# Patient Record
Sex: Female | Born: 1935 | Race: Black or African American | Hispanic: No | Smoking: Never smoker
Health system: Southern US, Community
[De-identification: ages and names within clinical notes are randomized; demographics above are authoritative.]

## PROBLEM LIST (undated history)

## (undated) DIAGNOSIS — I10 Essential (primary) hypertension: Secondary | ICD-10-CM

## (undated) DIAGNOSIS — I00 Rheumatic fever without heart involvement: Secondary | ICD-10-CM

## (undated) DIAGNOSIS — I350 Nonrheumatic aortic (valve) stenosis: Secondary | ICD-10-CM

## (undated) DIAGNOSIS — E119 Type 2 diabetes mellitus without complications: Secondary | ICD-10-CM

## (undated) DIAGNOSIS — E049 Nontoxic goiter, unspecified: Secondary | ICD-10-CM

## (undated) HISTORY — DX: Nontoxic goiter, unspecified: E04.9

## (undated) HISTORY — PX: TONSILLECTOMY: SHX5217

## (undated) HISTORY — DX: Nonrheumatic aortic (valve) stenosis: I35.0

## (undated) HISTORY — DX: Rheumatic fever without heart involvement: I00

---

## 2003-08-28 ENCOUNTER — Emergency Department (HOSPITAL_COMMUNITY): Admission: EM | Admit: 2003-08-28 | Discharge: 2003-08-29 | Payer: Self-pay | Admitting: Emergency Medicine

## 2003-12-26 ENCOUNTER — Encounter: Admission: RE | Admit: 2003-12-26 | Discharge: 2003-12-26 | Payer: Self-pay | Admitting: Family Medicine

## 2004-02-15 ENCOUNTER — Emergency Department (HOSPITAL_COMMUNITY): Admission: EM | Admit: 2004-02-15 | Discharge: 2004-02-16 | Payer: Self-pay | Admitting: Emergency Medicine

## 2005-07-06 ENCOUNTER — Encounter: Admission: RE | Admit: 2005-07-06 | Discharge: 2005-07-06 | Payer: Self-pay | Admitting: Family Medicine

## 2005-12-22 ENCOUNTER — Emergency Department (HOSPITAL_COMMUNITY): Admission: EM | Admit: 2005-12-22 | Discharge: 2005-12-22 | Payer: Self-pay | Admitting: *Deleted

## 2005-12-24 ENCOUNTER — Emergency Department (HOSPITAL_COMMUNITY): Admission: EM | Admit: 2005-12-24 | Discharge: 2005-12-24 | Payer: Self-pay | Admitting: Emergency Medicine

## 2006-01-13 ENCOUNTER — Other Ambulatory Visit: Admission: RE | Admit: 2006-01-13 | Discharge: 2006-01-13 | Payer: Self-pay | Admitting: Gynecology

## 2006-04-04 ENCOUNTER — Encounter (INDEPENDENT_AMBULATORY_CARE_PROVIDER_SITE_OTHER): Payer: Self-pay | Admitting: *Deleted

## 2006-04-04 ENCOUNTER — Ambulatory Visit (HOSPITAL_BASED_OUTPATIENT_CLINIC_OR_DEPARTMENT_OTHER): Admission: RE | Admit: 2006-04-04 | Discharge: 2006-04-04 | Payer: Self-pay | Admitting: Gynecology

## 2006-10-03 ENCOUNTER — Encounter: Admission: RE | Admit: 2006-10-03 | Discharge: 2006-10-03 | Payer: Self-pay | Admitting: Family Medicine

## 2007-03-20 ENCOUNTER — Emergency Department (HOSPITAL_COMMUNITY): Admission: EM | Admit: 2007-03-20 | Discharge: 2007-03-20 | Payer: Self-pay | Admitting: Emergency Medicine

## 2007-12-10 ENCOUNTER — Encounter: Admission: RE | Admit: 2007-12-10 | Discharge: 2007-12-10 | Payer: Self-pay | Admitting: Family Medicine

## 2008-02-08 ENCOUNTER — Emergency Department (HOSPITAL_COMMUNITY): Admission: EM | Admit: 2008-02-08 | Discharge: 2008-02-08 | Payer: Self-pay | Admitting: Emergency Medicine

## 2010-10-10 ENCOUNTER — Encounter: Payer: Self-pay | Admitting: Family Medicine

## 2011-02-04 NOTE — Op Note (Signed)
Jasmine Fernandez, Jasmine Fernandez              ACCOUNT NO.:  [id number]   MEDICAL RECORD NO.:  [id number]          PATIENT TYPE:  AMB   LOCATION:  [facility]                         FACILITY:  [facility]   PHYSICIAN:  Ivor Costa. Farrel Gobble, M.D. DATE OF BIRTH:  September 23, 1928   DATE OF PROCEDURE:  04/04/2006  DATE OF DISCHARGE:                                 OPERATIVE REPORT   PREOPERATIVE DIAGNOSIS:  Endometrial mass, possible polyp.   POSTOPERATIVE DIAGNOSIS:  Endometrial mass, possible polyp.   PROCEDURE:  D&C, hysteroscopy.   SECOND ASSISTANT:  Ivor Costa. Farrel Gobble, M.D.   ANESTHESIA:  General plus paracervical block of 15 cc of 0.25% Marcaine.   FINDINGS:  A large polypoid mass that was protruding through the cervix at  the beginning of the case.  The stalk was thick and difficult to delineate.  The endometrial lining appeared irregular and hypervascular.  The ostia were  not able to be visualized.   SPECIMENS:  Endometrial curettings and polyp.   ESTIMATED BLOOD LOSS:  Minimal.   IN AND OUT DEFICIT:  3% sorbitol solution was approximately 100 cc.   DESCRIPTION OF PROCEDURE:  The patient was taken to the operating room.  General anesthesia was induced.  She was placed in the dorsal lithotomy  position and prepped and draped in the usual sterile fashion after laminaria  placed the night before was removed.   The cervix was grossly dilated, and a 31 [place] dilator was advanced without  resistance through the cervix.  Therefore, the operative scope was advanced  through the cervix.  Immediately upon placing the operative scope, the  polypoid mass was seen protruding into the cervix.  We then followed the  mass through the cervix and into the uterine canal.  The stalk was difficult  to discern from the wall.  The scope was removed, and we elected to try the  polyp forceps.  Despite multiple attempts with the polyp forceps, minimal to  no tissue was obtained.  D&C with a sharp curettage similarly revealed  minimal to no tissue.  The scope was then advanced back through the cervix.  What was felt to be the stalk was cut, and pieces of the specimen were  taken; however, based on the size of the specimen that was supposed to be  present, it seemed suboptimal.  The scope was replaced.  Multiple placements  of the hysteroscope showed resection of a large mass.  We went back in with  the polyp forceps and ultimately removed a somewhat debulked polyp.  The  scope was advanced back through again.  There appeared to be possibly a  small residual portion versus artifact.  We were unable to adequately get  this because it was small and mobile.  The remainder of the tissue, however,  did appear abnormal for the patient's age and menopausal status.  Some  biopsies were taken of that as well and sent separately.  The feeling is  that this may be a hyperplastic polyp with atypia and possibly some  endometrial cancer despite the fact that her biopsy was benign.  Therefore,  we elected  to abort the procedure at this point for the possibility that the  patient may be going back to the OR for residual disease.  The instruments  were then removed.  We took a careful look at the lower segment as well as  the cervix.  The cervix was then injected with 15 cc of 0.25% Marcaine for  postoperative pain management.   She tolerated the procedure well.  Sponge, lap, and needle counts were  correct.  She was transferred to the recovery room in due fashion.      Ivor Costa. Farrel Gobble, M.D.  Electronically Signed     THL/MEDQ  D:  04/04/2006  T:  04/04/2006  Job:  [id number]

## 2011-02-04 NOTE — H&P (Signed)
Jasmine Fernandez, Jasmine Fernandez              ACCOUNT NO.:  [id number]   MEDICAL RECORD NO.:  [id number]          PATIENT TYPE:  AMB   LOCATION:  [facility]                         FACILITY:  [facility]   PHYSICIAN:  Ivor Costa. Farrel Gobble, M.D. DATE OF BIRTH:  1929/09/01   DATE OF ADMISSION:  DATE OF DISCHARGE:                                HISTORY & PHYSICAL   CHIEF COMPLAINT:  Endometrial mass.   HISTORY OF PRESENT ILLNESS:  The patient is a 77 year old postmenopausal  woman who presents to the emergency room at [facility] who had a CAT scan  as part of her evaluation and incidentally markedly thickened endometrial  stripe.  The patient therefore underwent an ultrasound which showed her  stripe to be 2.4 cm.  The patient does have a history of multi-fibroid  uterus and her uterus was irregular consistent with that history.  The  patient denied any history of postmenopausal bleeding and went into  spontaneous menopause approximately 20 years ago.  She was referred to [place]  because of the CAT scan findings.  The patient therefore underwent a  sonohystogram which with gentle extension of the wall what appeared to be an  endometrial polyp that measured 6.6 x 1.9 x 4 cm was seen.  Based on that  the patient will present now for a D&C hysteroscopy.   PAST OBSTETRICAL/GYNECOLOGICAL HISTORY:  Past OB-GYN history as mentioned  above with history of fibroids, she has had four spontaneous vaginal  deliveries.   PAST MEDICAL HISTORY:  Past medical history is significant for chronic  hypertension for which she takes Maxzide and Tenormin.  She also has  regurgitation for which she takes Pepcid.   PAST SURGICAL HISTORY:  The patient had tonsillectomy 55 years ago.   ALLERGIES:  NONE.   SOCIAL HISTORY:  Negative.   FAMILY HISTORY:  Negative for gynecologic cancers.   PHYSICAL EXAMINATION:  She is a well-appearing elderly female in no acute  distress.  Her heart was regular rate.  Her lungs were clear.  Her  abdomen  was obese, soft and nontender.  GYN exam shows postmenopausal changes to the  external genitalia.  The BUS is negative.  The vagina was pale and smooth.  The cervix is without gross lesions.  The uterus is axial, mobile and  nontender.  Rectovaginal exam was deferred.   ASSESSMENT AND PLAN:  The patient had an echo done in October 2006 showed  trace MR.  She did have an endometrial biopsy which came back with benign  endometrial polyp and fragments of proliferative endometrium prior to  today's procedure.  The patient will therefore present now in the morning  for a D&C hysteroscopy for an  endometrial mass, most likely a benign endometrial polyp, laminaria was  placed the day before, she was given a prescription for Darvocet-N 100 for  postoperative pain management.  All risks and benefits and questions of the  surgery were reviewed and accepted.      Ivor Costa. Farrel Gobble, M.D.  Electronically Signed     THL/MEDQ  D:  04/03/2006  T:  04/03/2006  Job:  [id number]

## 2011-05-20 ENCOUNTER — Other Ambulatory Visit: Payer: Self-pay | Admitting: Physician Assistant

## 2011-05-20 ENCOUNTER — Ambulatory Visit
Admission: RE | Admit: 2011-05-20 | Discharge: 2011-05-20 | Disposition: A | Discharge status: Home / Self Care | Payer: Medicare Other | Source: Ambulatory Visit | Attending: Physician Assistant | Admitting: Physician Assistant

## 2011-05-20 DIAGNOSIS — W19XXXA Unspecified fall, initial encounter: Secondary | ICD-10-CM

## 2011-06-15 LAB — DIFFERENTIAL
Basophils Absolute: 0
Basophils Relative: 0
Eosinophils Absolute: 0.1
Eosinophils Relative: 1
Lymphocytes Relative: 11 — ABNORMAL LOW
Lymphs Abs: 1.5
Monocytes Absolute: 0.3
Monocytes Relative: 2 — ABNORMAL LOW
Neutro Abs: 12.7 — ABNORMAL HIGH
Neutrophils Relative %: 87 — ABNORMAL HIGH

## 2011-06-15 LAB — CBC
HCT: 43.9
Hemoglobin: 15
MCHC: 34.1
MCV: 85.1
Platelets: 271
RBC: 5.16 — ABNORMAL HIGH
RDW: 13.1
WBC: 14.7 — ABNORMAL HIGH

## 2011-06-15 LAB — COMPREHENSIVE METABOLIC PANEL
ALT: 13
AST: 22
Albumin: 3.4 — ABNORMAL LOW
Alkaline Phosphatase: 70
BUN: 12
CO2: 24
Calcium: 10.3
Chloride: 103
Creatinine, Ser: 0.91
GFR calc Af Amer: >60
GFR calc non Af Amer: >60
Glucose, Bld: 135 — ABNORMAL HIGH
Potassium: 3.6
Sodium: 141
Total Bilirubin: 1.4 — ABNORMAL HIGH
Total Protein: 6.8

## 2011-07-05 LAB — DIFFERENTIAL
Basophils Absolute: 0.1
Basophils Relative: 0
Eosinophils Absolute: 0.2
Eosinophils Relative: 1
Lymphocytes Relative: 11 — ABNORMAL LOW
Lymphs Abs: 1.8
Monocytes Absolute: 0.6
Monocytes Relative: 3
Neutro Abs: 14.2 — ABNORMAL HIGH
Neutrophils Relative %: 85 — ABNORMAL HIGH

## 2011-07-05 LAB — CBC
HCT: 42.3
Hemoglobin: 14.4
MCHC: 34.1
MCV: 84.2
Platelets: 336
RBC: 5.02
RDW: 13.2
WBC: 16.9 — ABNORMAL HIGH

## 2011-07-05 LAB — BASIC METABOLIC PANEL
BUN: 14
CO2: 31
Calcium: 10
Chloride: 100
Creatinine, Ser: 0.82
GFR calc Af Amer: >60
GFR calc non Af Amer: >60
Glucose, Bld: 144 — ABNORMAL HIGH
Potassium: 4.4
Sodium: 137

## 2011-07-05 LAB — URINALYSIS, ROUTINE W REFLEX MICROSCOPIC
Bilirubin Urine: NEGATIVE
Glucose, UA: NEGATIVE
Hgb urine dipstick: NEGATIVE
Specific Gravity, Urine: 1.018
pH: 7.5

## 2011-07-05 LAB — URINE MICROSCOPIC-ADD ON

## 2011-07-05 LAB — LIPASE, BLOOD: Lipase: 26

## 2012-08-02 ENCOUNTER — Emergency Department (HOSPITAL_COMMUNITY): Payer: Medicare Other

## 2012-08-02 ENCOUNTER — Encounter (HOSPITAL_COMMUNITY): Payer: Self-pay | Admitting: Emergency Medicine

## 2012-08-02 ENCOUNTER — Emergency Department (HOSPITAL_COMMUNITY)
Admission: EM | Admit: 2012-08-02 | Discharge: 2012-08-02 | Disposition: A | Discharge status: Home / Self Care | Payer: Medicare Other | Attending: Emergency Medicine | Admitting: Emergency Medicine

## 2012-08-02 DIAGNOSIS — E119 Type 2 diabetes mellitus without complications: Secondary | ICD-10-CM | POA: Insufficient documentation

## 2012-08-02 DIAGNOSIS — R109 Unspecified abdominal pain: Secondary | ICD-10-CM | POA: Insufficient documentation

## 2012-08-02 DIAGNOSIS — R112 Nausea with vomiting, unspecified: Secondary | ICD-10-CM | POA: Insufficient documentation

## 2012-08-02 DIAGNOSIS — R197 Diarrhea, unspecified: Secondary | ICD-10-CM | POA: Insufficient documentation

## 2012-08-02 DIAGNOSIS — I10 Essential (primary) hypertension: Secondary | ICD-10-CM | POA: Insufficient documentation

## 2012-08-02 DIAGNOSIS — N390 Urinary tract infection, site not specified: Secondary | ICD-10-CM

## 2012-08-02 DIAGNOSIS — R9431 Abnormal electrocardiogram [ECG] [EKG]: Secondary | ICD-10-CM | POA: Insufficient documentation

## 2012-08-02 HISTORY — DX: Essential (primary) hypertension: I10

## 2012-08-02 HISTORY — DX: Type 2 diabetes mellitus without complications: E11.9

## 2012-08-02 LAB — COMPREHENSIVE METABOLIC PANEL
AST: 28 U/L (ref 0–37)
Albumin: 3.5 g/dL (ref 3.5–5.2)
Alkaline Phosphatase: 80 U/L (ref 39–117)
BUN: 24 mg/dL — ABNORMAL HIGH (ref 6–23)
CO2: 26 mEq/L (ref 19–32)
Chloride: 88 mEq/L — ABNORMAL LOW (ref 96–112)
Creatinine, Ser: 0.93 mg/dL (ref 0.50–1.10)
GFR calc non Af Amer: 56 mL/min — ABNORMAL LOW (ref >90)
Potassium: 3.9 mEq/L (ref 3.5–5.1)
Total Bilirubin: 0.6 mg/dL (ref 0.3–1.2)

## 2012-08-02 LAB — CBC WITH DIFFERENTIAL/PLATELET
Basophils Absolute: 0 10*3/uL (ref 0.0–0.1)
Basophils Relative: 0 % (ref 0–1)
HCT: 44.3 % (ref 36.0–46.0)
Hemoglobin: 16.2 g/dL — ABNORMAL HIGH (ref 12.0–15.0)
Lymphocytes Relative: 7 % — ABNORMAL LOW (ref 12–46)
MCHC: 36.6 g/dL — ABNORMAL HIGH (ref 30.0–36.0)
Monocytes Absolute: 0.9 10*3/uL (ref 0.1–1.0)
Monocytes Relative: 5 % (ref 3–12)
Neutro Abs: 17.7 10*3/uL — ABNORMAL HIGH (ref 1.7–7.7)
Neutrophils Relative %: 88 % — ABNORMAL HIGH (ref 43–77)
WBC: 20.1 10*3/uL — ABNORMAL HIGH (ref 4.0–10.5)

## 2012-08-02 LAB — URINALYSIS, ROUTINE W REFLEX MICROSCOPIC
Bilirubin Urine: NEGATIVE
Glucose, UA: NEGATIVE mg/dL
Ketones, ur: 40 mg/dL — AB
Protein, ur: 30 mg/dL — AB
pH: 5.5 (ref 5.0–8.0)

## 2012-08-02 LAB — URINE MICROSCOPIC-ADD ON

## 2012-08-02 LAB — LACTIC ACID, PLASMA: Lactic Acid, Venous: 1.2 mmol/L (ref 0.5–2.2)

## 2012-08-02 MED ORDER — SODIUM CHLORIDE 0.9 % IV SOLN
Freq: Once | INTRAVENOUS | Status: AC
  Administered 2012-08-02: 01:00:00 via INTRAVENOUS

## 2012-08-02 MED ORDER — TRAMADOL HCL 50 MG PO TABS: 50.0000 mg | ORAL_TABLET | Freq: Four times a day (QID) | ORAL | Status: DC | PRN

## 2012-08-02 MED ORDER — ONDANSETRON HCL 4 MG PO TABS: 4.0000 mg | ORAL_TABLET | Freq: Four times a day (QID) | ORAL | Status: DC

## 2012-08-02 MED ORDER — ONDANSETRON HCL 4 MG/2ML IJ SOLN: 4.0000 mg | Freq: Once | INTRAMUSCULAR | Status: DC

## 2012-08-02 MED ORDER — CIPROFLOXACIN HCL 500 MG PO TABS: 500.0000 mg | ORAL_TABLET | Freq: Two times a day (BID) | ORAL | Status: DC

## 2012-08-02 MED ORDER — ONDANSETRON HCL 4 MG/2ML IJ SOLN
4.0000 mg | Freq: Once | INTRAMUSCULAR | Status: AC
  Administered 2012-08-02: 4 mg via INTRAVENOUS
  Filled 2012-08-02: qty 2

## 2012-08-02 MED ORDER — MORPHINE SULFATE 4 MG/ML IJ SOLN
4.0000 mg | Freq: Once | INTRAMUSCULAR | Status: AC
  Administered 2012-08-02: 4 mg via INTRAVENOUS
  Filled 2012-08-02: qty 1

## 2012-08-02 MED ORDER — IOHEXOL 300 MG/ML  SOLN
100.0000 mL | Freq: Once | INTRAMUSCULAR | Status: AC | PRN
  Administered 2012-08-02: 100 mL via INTRAVENOUS

## 2012-08-02 MED ORDER — CIPROFLOXACIN HCL 500 MG PO TABS
500.0000 mg | ORAL_TABLET | Freq: Once | ORAL | Status: AC
  Administered 2012-08-02: 500 mg via ORAL
  Filled 2012-08-02: qty 1

## 2012-08-02 NOTE — ED Notes (Signed)
ZOX:[id number]<AV> Expected date:08/02/12<BR> Expected time:12:49 AM<BR> Means of arrival:Ambulance<BR> Comments:<BR> N/V/D for 3 days

## 2012-08-02 NOTE — ED Notes (Signed)
Pt is unable to urinate.  Will try in 30 minutes.

## 2012-08-02 NOTE — ED Provider Notes (Signed)
History     CSN: [id number]  Arrival date & time 08/02/12  0100   First MD Initiated Contact with Patient 08/02/12 0111      Chief Complaint  Patient presents with  . Emesis  . Diarrhea    (Consider location/radiation/quality/duration/timing/severity/associated sxs/prior treatment) HPI Hx per PT. N/V x 3 days with ABD pain all over, sharp in quality, had some loose stool 3 days ago and no BM since. No F/C. No blood in emesis. No prior ABD surgeries, no known sick contacts, no rash or recent travel, symptoms continuous and worsening. No back pain. No h/o SBO. PCP Lupe Carney  Past Medical History  Diagnosis Date  . Diabetes mellitus without complication   . Hypertension     History reviewed. No pertinent past surgical history.  No family history on file.  History  Substance Use Topics  . Smoking status: Not on file  . Smokeless tobacco: Not on file  . Alcohol Use:     OB History    Grav Para Term Preterm Abortions TAB SAB Ect Mult Living                  Review of Systems  Constitutional: Negative for fever and chills.  HENT: Negative for neck pain and neck stiffness.   Eyes: Negative for pain.  Respiratory: Negative for shortness of breath.   Cardiovascular: Negative for chest pain.  Gastrointestinal: Positive for nausea, vomiting and abdominal pain.  Genitourinary: Negative for dysuria.  Musculoskeletal: Negative for back pain.  Skin: Negative for rash.  Neurological: Negative for headaches.  All other systems reviewed and are negative.    Allergies  Penicillins  Home Medications  No current outpatient prescriptions on file.  BP 199/57  Pulse 34  Temp 97.9 F (36.6 C) (Oral)  Resp 24  SpO2 97%  Physical Exam  Nursing note and vitals reviewed. Constitutional: She is oriented to person, place, and time. She appears well-developed and well-nourished.  HENT:  Head: Normocephalic and atraumatic.  Eyes: EOM are normal. Pupils are equal, round,  and reactive to light.  Neck: Neck supple.  Cardiovascular: Normal rate and intact distal pulses.   Murmur heard. Pulmonary/Chest: Effort normal and breath sounds normal. No respiratory distress.  Abdominal: Soft. She exhibits no distension. There is no rebound and no guarding.       Mild diffuse tenderness, dec bowel sounds  Musculoskeletal: Normal range of motion. She exhibits no edema.  Neurological: She is alert and oriented to person, place, and time.  Skin: Skin is warm and dry.    ED Course  Procedures (including critical care time)  Results for orders placed during the hospital encounter of 08/02/12  CBC WITH DIFFERENTIAL      Component Value Range   WBC 20.1 (*) 4.0 - 10.5 K/uL   RBC 5.46 (*) 3.87 - 5.11 MIL/uL   Hemoglobin 16.2 (*) 12.0 - 15.0 g/dL   HCT 40.9  81.1 - 91.4 %   MCV 81.1  78.0 - 100.0 fL   MCH 29.7  26.0 - 34.0 pg   MCHC 36.6 (*) 30.0 - 36.0 g/dL   RDW 78.2  95.6 - 21.3 %   Platelets 402 (*) 150 - 400 K/uL   Neutrophils Relative 88 (*) 43 - 77 %   Neutro Abs 17.7 (*) 1.7 - 7.7 K/uL   Lymphocytes Relative 7 (*) 12 - 46 %   Lymphs Abs 1.5  0.7 - 4.0 K/uL   Monocytes Relative 5  3 - 12 %   Monocytes Absolute 0.9  0.1 - 1.0 K/uL   Eosinophils Relative 0  0 - 5 %   Eosinophils Absolute 0.0  0.0 - 0.7 K/uL   Basophils Relative 0  0 - 1 %   Basophils Absolute 0.0  0.0 - 0.1 K/uL  COMPREHENSIVE METABOLIC PANEL      Component Value Range   Sodium 129 (*) 135 - 145 mEq/L   Potassium 3.9  3.5 - 5.1 mEq/L   Chloride 88 (*) 96 - 112 mEq/L   CO2 26  19 - 32 mEq/L   Glucose, Bld 147 (*) 70 - 99 mg/dL   BUN 24 (*) 6 - 23 mg/dL   Creatinine, Ser 4.09  0.50 - 1.10 mg/dL   Calcium 81.1 (*) 8.4 - 10.5 mg/dL   Total Protein 7.5  6.0 - 8.3 g/dL   Albumin 3.5  3.5 - 5.2 g/dL   AST 28  0 - 37 U/L   ALT 11  0 - 35 U/L   Alkaline Phosphatase 80  39 - 117 U/L   Total Bilirubin 0.6  0.3 - 1.2 mg/dL   GFR calc non Af Amer 56 (*) >90 mL/min   GFR calc Af Amer 65 (*)  >90 mL/min  LIPASE, BLOOD      Component Value Range   Lipase 47  11 - 59 U/L  URINALYSIS, ROUTINE W REFLEX MICROSCOPIC      Component Value Range   Color, Urine YELLOW  YELLOW   APPearance HAZY (*) CLEAR   Specific Gravity, Urine 1.043 (*) 1.005 - 1.030   pH 5.5  5.0 - 8.0   Glucose, UA NEGATIVE  NEGATIVE mg/dL   Hgb urine dipstick NEGATIVE  NEGATIVE   Bilirubin Urine NEGATIVE  NEGATIVE   Ketones, ur 40 (*) NEGATIVE mg/dL   Protein, ur 30 (*) NEGATIVE mg/dL   Urobilinogen, UA 0.2  0.0 - 1.0 mg/dL   Nitrite POSITIVE (*) NEGATIVE   Leukocytes, UA SMALL (*) NEGATIVE  GLUCOSE, CAPILLARY      Component Value Range   Glucose-Capillary 139 (*) 70 - 99 mg/dL  TROPONIN I      Component Value Range   Troponin I <0.30  <0.30 ng/mL  LACTIC ACID, PLASMA      Component Value Range   Lactic Acid, Venous 1.2  0.5 - 2.2 mmol/L  URINE MICROSCOPIC-ADD ON      Component Value Range   Squamous Epithelial / LPF RARE  RARE   WBC, UA 21-50  <3 WBC/hpf   RBC / HPF 0-2  <3 RBC/hpf   Bacteria, UA MANY (*) RARE   Casts HYALINE CASTS (*) NEGATIVE   US Abdomen Complete  08/02/2012  *RADIOLOGY REPORT*  Clinical Data:  Vomiting, abdominal pain.  COMPLETE ABDOMINAL ULTRASOUND  Comparison:  CT earlier today.  Findings:  Gallbladder:  No gallstones, gallbladder wall thickening, or pericholecystic fluid.  Common bile duct:   Slightly dilated, measuring 8 mm to 11 mm. Distal common bile duct not visualized.  Liver:  Mildly heterogeneous echotexture with "starry-sky" appearance.  This can be seen with hepatitis.  Recommend clinical correlation.  No focal abnormality.  IVC:  Not visualized due to overlying bowel gas.  Pancreas:  Not visualized due to overlying bowel gas.  Spleen:  Within normal limits in size and echotexture.  Right Kidney:   Slight fullness of the right renal collecting system.  No focal abnormality.  Left Kidney:  Normal in size and parenchymal  echogenicity.  No evidence of mass or hydronephrosis.   Abdominal aorta:  Not visualized due to overlying bowel gas.  IMPRESSION: "Starry-sky " appearance of the liver.  This can be seen with hepatitis.  Recommend clinical correlation.  Borderline enlargement of the common bile duct.   Original Report Authenticated By: Charlett Nose, M.D.    Ct Abdomen Pelvis W Contrast  08/02/2012  *RADIOLOGY REPORT*  Clinical Data: Diffuse abdominal pain, vomiting.  CT ABDOMEN AND PELVIS WITH CONTRAST  Technique:  Multidetector CT imaging of the abdomen and pelvis was performed following the standard protocol during bolus administration of intravenous contrast.  Contrast: OMNIPAQUE IOHEXOL 300 MG/ML  SOLN  Comparison: 12/24/2005  Findings: Heart is borderline in size.  Aortic and mitral valve calcifications present.  Lung bases are clear.  No effusions.  Liver, gallbladder, spleen, pancreas, left adrenal and kidneys are unremarkable.  Small bilateral renal cysts, stable.  Small nodule in the right adrenal gland measuring 10 mm, stable.  Multiple low-density areas within the uterus compatible with fibroids, some of which are calcified. Again, thickened endometrium is noted with possible central mass.  This is unchanged in size and appearance since 2007.  Urinary bladder and adnexa are unremarkable.  Appendix is visualized and is normal.  Stomach and large bowel grossly unremarkable.  Small bowel is normal course and caliber. No free fluid, free air or adenopathy.  Aorta is heavily calcified, non-aneurysmal.  Advanced degenerative disc and facet disease in the lumbar spine.  Grade 1 anterolisthesis of L4 on L5 and L5 on S1.  IMPRESSION: Enlarged fibroid uterus.  Again, as noted on prior study, there is a endometrial thickening for patient's age, measuring up to 2.3 cm in thickness.  Findings have not significantly changed since prior study.  No acute findings.   Original Report Authenticated By: Charlett Nose, M.D.       Date: 08/02/2012  Rate: 75  Rhythm: sinus with  bigeminy  QRS Axis: left  Intervals: normal  ST/T Wave abnormalities: nonspecific ST changes  Conduction Disutrbances:none  Narrative Interpretation:   Old EKG Reviewed: none available  ECG reviewed - no palpitations or weakness/ syncope - PT tells me she sees a cardiologist for h/o Rheumatic fever and gets ECGs regularily - no comparison ECG available  IVFs. IV morphine. IV zofran  4:42 AM recheck - Pain immproved. Still nauseated with emesis. CT reviewed no acute surgical process identified.   IV ABx for UTI and stable for d/c home. MDM   N/V with mild ABd pain and work up as above:  For UTI plan Abx and f/u PCP recheck ABd pain.   IVFs. IV Narcotics - pain resolved IV zofran - no emesis in ED Labs - leukocytosis UA -  U cx pending CT - tolerated PO contrast and PO fluids [place] - abnl liver GI referral, normal LFts ECG - unk baseline plan f/u CAR  VS and nursing notes reviewed. Improved conditoin and stable for d/c home.          Sunnie Nielsen, MD 08/02/12 [phone]

## 2012-08-02 NOTE — ED Notes (Signed)
Pt refused fluids at this time, states "still full from those 2 cups I drank",  pt referring to the contrast medium that she drank earlier.

## 2012-08-02 NOTE — ED Notes (Signed)
Report given via EMS. Pt c/o N/V/D x 3 days and loss of appetite. Initial VS 180 palpated, 62 pulse, RR 18, CBG 169 at 0039. Hx of DM and HTN. Allergic to penicillin. AAAOx4. Pt ambulatory.

## 2012-08-02 NOTE — ED Notes (Signed)
Pt cannot urinate. 

## 2012-08-04 LAB — URINE CULTURE

## 2012-08-05 NOTE — ED Notes (Signed)
+  Urine. Patient treated with Cipro. Sensitive to same. Per protocol MD. °

## 2014-01-12 ENCOUNTER — Encounter: Payer: Self-pay | Admitting: *Deleted

## 2014-01-12 DIAGNOSIS — E119 Type 2 diabetes mellitus without complications: Secondary | ICD-10-CM | POA: Insufficient documentation

## 2014-01-12 DIAGNOSIS — I10 Essential (primary) hypertension: Secondary | ICD-10-CM | POA: Insufficient documentation

## 2014-01-12 DIAGNOSIS — I350 Nonrheumatic aortic (valve) stenosis: Secondary | ICD-10-CM | POA: Insufficient documentation

## 2014-07-18 ENCOUNTER — Other Ambulatory Visit (HOSPITAL_COMMUNITY): Payer: Self-pay | Admitting: Family Medicine

## 2014-07-18 DIAGNOSIS — I350 Nonrheumatic aortic (valve) stenosis: Secondary | ICD-10-CM

## 2014-07-21 ENCOUNTER — Ambulatory Visit (HOSPITAL_COMMUNITY): Payer: Medicare Other

## 2014-07-25 ENCOUNTER — Inpatient Hospital Stay (HOSPITAL_COMMUNITY): Admission: RE | Admit: 2014-07-25 | Payer: Medicare PPO | Source: Ambulatory Visit

## 2014-07-28 ENCOUNTER — Inpatient Hospital Stay (HOSPITAL_COMMUNITY): Admission: RE | Admit: 2014-07-28 | Payer: Medicare PPO | Source: Ambulatory Visit

## 2014-07-31 ENCOUNTER — Ambulatory Visit (HOSPITAL_COMMUNITY)
Admission: RE | Admit: 2014-07-31 | Discharge: 2014-07-31 | Disposition: A | Discharge status: Home / Self Care | Payer: Medicare PPO | Source: Ambulatory Visit | Attending: Cardiology | Admitting: Cardiology

## 2014-07-31 DIAGNOSIS — I359 Nonrheumatic aortic valve disorder, unspecified: Secondary | ICD-10-CM

## 2014-07-31 DIAGNOSIS — I350 Nonrheumatic aortic (valve) stenosis: Secondary | ICD-10-CM

## 2014-07-31 DIAGNOSIS — R011 Cardiac murmur, unspecified: Secondary | ICD-10-CM

## 2014-07-31 NOTE — Progress Notes (Signed)
2D Echo Performed 07/31/2014    Naethan Bracewell, RCS  

## 2015-06-08 ENCOUNTER — Encounter: Payer: Self-pay | Admitting: Cardiovascular Disease

## 2015-06-22 ENCOUNTER — Emergency Department (HOSPITAL_COMMUNITY)
Admission: EM | Admit: 2015-06-22 | Discharge: 2015-06-22 | Disposition: A | Discharge status: Home / Self Care | Payer: Medicare PPO | Attending: Emergency Medicine | Admitting: Emergency Medicine

## 2015-06-22 ENCOUNTER — Encounter (HOSPITAL_COMMUNITY): Payer: Self-pay | Admitting: Emergency Medicine

## 2015-06-22 DIAGNOSIS — Y92410 Unspecified street and highway as the place of occurrence of the external cause: Secondary | ICD-10-CM | POA: Insufficient documentation

## 2015-06-22 DIAGNOSIS — Y998 Other external cause status: Secondary | ICD-10-CM | POA: Insufficient documentation

## 2015-06-22 DIAGNOSIS — Z880 Allergy status to penicillin: Secondary | ICD-10-CM | POA: Diagnosis not present

## 2015-06-22 DIAGNOSIS — R4182 Altered mental status, unspecified: Secondary | ICD-10-CM | POA: Insufficient documentation

## 2015-06-22 DIAGNOSIS — E119 Type 2 diabetes mellitus without complications: Secondary | ICD-10-CM | POA: Diagnosis not present

## 2015-06-22 DIAGNOSIS — E871 Hypo-osmolality and hyponatremia: Secondary | ICD-10-CM | POA: Diagnosis not present

## 2015-06-22 DIAGNOSIS — Y9389 Activity, other specified: Secondary | ICD-10-CM | POA: Insufficient documentation

## 2015-06-22 DIAGNOSIS — I10 Essential (primary) hypertension: Secondary | ICD-10-CM | POA: Diagnosis not present

## 2015-06-22 DIAGNOSIS — Z041 Encounter for examination and observation following transport accident: Secondary | ICD-10-CM | POA: Diagnosis not present

## 2015-06-22 DIAGNOSIS — Z79899 Other long term (current) drug therapy: Secondary | ICD-10-CM | POA: Diagnosis not present

## 2015-06-22 LAB — CBC WITH DIFFERENTIAL/PLATELET
Basophils Absolute: 0 10*3/uL (ref 0.0–0.1)
Basophils Relative: 0 %
EOS ABS: 0.2 10*3/uL (ref 0.0–0.7)
Eosinophils Relative: 2 %
HCT: 40.8 % (ref 36.0–46.0)
HEMOGLOBIN: 14.1 g/dL (ref 12.0–15.0)
LYMPHS ABS: 1.5 10*3/uL (ref 0.7–4.0)
LYMPHS PCT: 13 %
MCH: 29.4 pg (ref 26.0–34.0)
MCHC: 34.6 g/dL (ref 30.0–36.0)
MCV: 85 fL (ref 78.0–100.0)
Monocytes Absolute: 0.9 10*3/uL (ref 0.1–1.0)
Monocytes Relative: 8 %
NEUTROS ABS: 9.1 10*3/uL — AB (ref 1.7–7.7)
NEUTROS PCT: 77 %
Platelets: 325 10*3/uL (ref 150–400)
RBC: 4.8 MIL/uL (ref 3.87–5.11)
RDW: 12.2 % (ref 11.5–15.5)
WBC: 11.7 10*3/uL — AB (ref 4.0–10.5)

## 2015-06-22 LAB — COMPREHENSIVE METABOLIC PANEL
ALT: 12 U/L — ABNORMAL LOW (ref 14–54)
ANION GAP: 7 (ref 5–15)
AST: 22 U/L (ref 15–41)
Albumin: 3.6 g/dL (ref 3.5–5.0)
Alkaline Phosphatase: 74 U/L (ref 38–126)
BUN: 12 mg/dL (ref 6–20)
CHLORIDE: 96 mmol/L — AB (ref 101–111)
CO2: 27 mmol/L (ref 22–32)
Calcium: 9.8 mg/dL (ref 8.9–10.3)
Creatinine, Ser: 0.79 mg/dL (ref 0.44–1.00)
Glucose, Bld: 108 mg/dL — ABNORMAL HIGH (ref 65–99)
POTASSIUM: 4 mmol/L (ref 3.5–5.1)
Sodium: 130 mmol/L — ABNORMAL LOW (ref 135–145)
Total Bilirubin: 0.7 mg/dL (ref 0.3–1.2)
Total Protein: 7 g/dL (ref 6.5–8.1)

## 2015-06-22 LAB — URINALYSIS, ROUTINE W REFLEX MICROSCOPIC
BILIRUBIN URINE: NEGATIVE
Glucose, UA: NEGATIVE mg/dL
Hgb urine dipstick: NEGATIVE
KETONES UR: NEGATIVE mg/dL
NITRITE: NEGATIVE
Protein, ur: 30 mg/dL — AB
SPECIFIC GRAVITY, URINE: 1.012 (ref 1.005–1.030)
UROBILINOGEN UA: 1 mg/dL (ref 0.0–1.0)
pH: 7 (ref 5.0–8.0)

## 2015-06-22 LAB — URINE MICROSCOPIC-ADD ON

## 2015-06-22 LAB — I-STAT TROPONIN, ED: TROPONIN I, POC: 0.05 ng/mL (ref 0.00–0.08)

## 2015-06-22 NOTE — ED Notes (Signed)
Case management has spoke with family, okay for d/c
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
## 2015-06-22 NOTE — ED Notes (Signed)
Family requesting to talk to social work before discharge

## 2015-06-22 NOTE — ED Notes (Signed)
Will move pt into a private room to complete EKG
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
## 2015-06-23 ENCOUNTER — Emergency Department (HOSPITAL_COMMUNITY)
Admission: EM | Admit: 2015-06-23 | Discharge: 2015-06-23 | Disposition: A | Discharge status: Home / Self Care | Payer: Medicare PPO | Attending: Emergency Medicine | Admitting: Emergency Medicine

## 2015-06-23 ENCOUNTER — Emergency Department (HOSPITAL_COMMUNITY): Payer: Medicare PPO

## 2015-06-23 ENCOUNTER — Encounter (HOSPITAL_COMMUNITY): Payer: Self-pay | Admitting: *Deleted

## 2015-06-23 DIAGNOSIS — R011 Cardiac murmur, unspecified: Secondary | ICD-10-CM | POA: Diagnosis not present

## 2015-06-23 DIAGNOSIS — E119 Type 2 diabetes mellitus without complications: Secondary | ICD-10-CM | POA: Diagnosis not present

## 2015-06-23 DIAGNOSIS — R410 Disorientation, unspecified: Secondary | ICD-10-CM | POA: Insufficient documentation

## 2015-06-23 DIAGNOSIS — M25561 Pain in right knee: Secondary | ICD-10-CM

## 2015-06-23 DIAGNOSIS — Y92009 Unspecified place in unspecified non-institutional (private) residence as the place of occurrence of the external cause: Secondary | ICD-10-CM | POA: Diagnosis not present

## 2015-06-23 DIAGNOSIS — W010XXA Fall on same level from slipping, tripping and stumbling without subsequent striking against object, initial encounter: Secondary | ICD-10-CM | POA: Diagnosis not present

## 2015-06-23 DIAGNOSIS — M25562 Pain in left knee: Secondary | ICD-10-CM

## 2015-06-23 DIAGNOSIS — I10 Essential (primary) hypertension: Secondary | ICD-10-CM | POA: Insufficient documentation

## 2015-06-23 DIAGNOSIS — Y998 Other external cause status: Secondary | ICD-10-CM | POA: Insufficient documentation

## 2015-06-23 DIAGNOSIS — S8992XA Unspecified injury of left lower leg, initial encounter: Secondary | ICD-10-CM | POA: Diagnosis not present

## 2015-06-23 DIAGNOSIS — R531 Weakness: Secondary | ICD-10-CM | POA: Diagnosis present

## 2015-06-23 DIAGNOSIS — Z880 Allergy status to penicillin: Secondary | ICD-10-CM | POA: Insufficient documentation

## 2015-06-23 DIAGNOSIS — S8991XA Unspecified injury of right lower leg, initial encounter: Secondary | ICD-10-CM | POA: Diagnosis not present

## 2015-06-23 DIAGNOSIS — Z7982 Long term (current) use of aspirin: Secondary | ICD-10-CM | POA: Insufficient documentation

## 2015-06-23 DIAGNOSIS — Z79899 Other long term (current) drug therapy: Secondary | ICD-10-CM | POA: Diagnosis not present

## 2015-06-23 DIAGNOSIS — Y9389 Activity, other specified: Secondary | ICD-10-CM | POA: Insufficient documentation

## 2015-06-23 LAB — BASIC METABOLIC PANEL
Anion gap: 7 (ref 5–15)
BUN: 14 mg/dL (ref 6–20)
CHLORIDE: 97 mmol/L — AB (ref 101–111)
CO2: 26 mmol/L (ref 22–32)
CREATININE: 0.86 mg/dL (ref 0.44–1.00)
Calcium: 9.7 mg/dL (ref 8.9–10.3)
Glucose, Bld: 106 mg/dL — ABNORMAL HIGH (ref 65–99)
POTASSIUM: 4.9 mmol/L (ref 3.5–5.1)
SODIUM: 130 mmol/L — AB (ref 135–145)

## 2015-06-23 LAB — URINALYSIS, ROUTINE W REFLEX MICROSCOPIC
Bilirubin Urine: NEGATIVE
GLUCOSE, UA: NEGATIVE mg/dL
HGB URINE DIPSTICK: NEGATIVE
KETONES UR: NEGATIVE mg/dL
LEUKOCYTES UA: NEGATIVE
Nitrite: NEGATIVE
PROTEIN: 30 mg/dL — AB
Specific Gravity, Urine: 1.017 (ref 1.005–1.030)
UROBILINOGEN UA: 1 mg/dL (ref 0.0–1.0)
pH: 6.5 (ref 5.0–8.0)

## 2015-06-23 LAB — URINE MICROSCOPIC-ADD ON

## 2015-06-23 LAB — CBC WITH DIFFERENTIAL/PLATELET
BASOS PCT: 0 %
Basophils Absolute: 0 10*3/uL (ref 0.0–0.1)
EOS ABS: 0.4 10*3/uL (ref 0.0–0.7)
EOS PCT: 2 %
HCT: 43.1 % (ref 36.0–46.0)
HEMOGLOBIN: 14.9 g/dL (ref 12.0–15.0)
Lymphocytes Relative: 11 %
Lymphs Abs: 1.6 10*3/uL (ref 0.7–4.0)
MCH: 29.7 pg (ref 26.0–34.0)
MCHC: 34.6 g/dL (ref 30.0–36.0)
MCV: 85.9 fL (ref 78.0–100.0)
Monocytes Absolute: 0.9 10*3/uL (ref 0.1–1.0)
Monocytes Relative: 6 %
NEUTROS PCT: 81 %
Neutro Abs: 11.8 10*3/uL — ABNORMAL HIGH (ref 1.7–7.7)
PLATELETS: 281 10*3/uL (ref 150–400)
RBC: 5.02 MIL/uL (ref 3.87–5.11)
RDW: 12.3 % (ref 11.5–15.5)
WBC: 14.7 10*3/uL — AB (ref 4.0–10.5)
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
## 2015-06-23 NOTE — Discharge Instructions (Signed)
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
## 2015-06-23 NOTE — ED Provider Notes (Signed)
Medical screening examination/treatment/procedure(s) were conducted as a shared visit with non-physician practitioner(s) and myself.  I personally evaluated the patient during the encounter.   EKG Interpretation   Date/Time:  Tuesday June 23 2015 15:49:21 EDT Ventricular Rate:  50 PR Interval:  314 QRS Duration: 84 QT Interval:  481 QTC Calculation: 439 R Axis:   -27 Text Interpretation:  Sinus rhythm Prolonged PR interval LVH with  secondary repolarization abnormality Probable anterior infarct, age  indeterminate No significant change since last tracing Confirmed by Gesenia Bantz   MD, Riot Waterworth ([id number]) on 06/23/2015 5:44:34 PM     Patient here complaining of weakness. Labs show mild hyponatremia which is unchanged from yesterday. Patient has no focal neurological findings. Feels well to go home  Lorre Nick, MD 06/23/15 1945

## 2015-06-24 LAB — URINE CULTURE

## 2015-06-29 ENCOUNTER — Encounter (HOSPITAL_COMMUNITY): Payer: Self-pay | Admitting: *Deleted

## 2015-06-29 ENCOUNTER — Inpatient Hospital Stay (HOSPITAL_COMMUNITY)
Admission: EM | Admit: 2015-06-29 | Discharge: 2015-07-03 | DRG: 682 | Disposition: A | Discharge status: Skilled Nursing Facility | Payer: Medicare PPO | Attending: Internal Medicine | Admitting: Internal Medicine

## 2015-06-29 ENCOUNTER — Emergency Department (HOSPITAL_COMMUNITY): Payer: Medicare PPO

## 2015-06-29 DIAGNOSIS — Z6824 Body mass index (BMI) 24.0-24.9, adult: Secondary | ICD-10-CM | POA: Diagnosis not present

## 2015-06-29 DIAGNOSIS — E870 Hyperosmolality and hypernatremia: Secondary | ICD-10-CM | POA: Diagnosis present

## 2015-06-29 DIAGNOSIS — E440 Moderate protein-calorie malnutrition: Secondary | ICD-10-CM

## 2015-06-29 DIAGNOSIS — L89329 Pressure ulcer of left buttock, unspecified stage: Secondary | ICD-10-CM | POA: Diagnosis not present

## 2015-06-29 DIAGNOSIS — M6282 Rhabdomyolysis: Secondary | ICD-10-CM | POA: Diagnosis not present

## 2015-06-29 DIAGNOSIS — F0390 Unspecified dementia without behavioral disturbance: Secondary | ICD-10-CM | POA: Diagnosis present

## 2015-06-29 DIAGNOSIS — E860 Dehydration: Secondary | ICD-10-CM | POA: Diagnosis not present

## 2015-06-29 DIAGNOSIS — Z8249 Family history of ischemic heart disease and other diseases of the circulatory system: Secondary | ICD-10-CM

## 2015-06-29 DIAGNOSIS — Y92009 Unspecified place in unspecified non-institutional (private) residence as the place of occurrence of the external cause: Secondary | ICD-10-CM

## 2015-06-29 DIAGNOSIS — I350 Nonrheumatic aortic (valve) stenosis: Secondary | ICD-10-CM | POA: Diagnosis present

## 2015-06-29 DIAGNOSIS — I00 Rheumatic fever without heart involvement: Secondary | ICD-10-CM | POA: Diagnosis present

## 2015-06-29 DIAGNOSIS — E119 Type 2 diabetes mellitus without complications: Secondary | ICD-10-CM | POA: Diagnosis present

## 2015-06-29 DIAGNOSIS — N179 Acute kidney failure, unspecified: Secondary | ICD-10-CM | POA: Diagnosis present

## 2015-06-29 DIAGNOSIS — L89159 Pressure ulcer of sacral region, unspecified stage: Secondary | ICD-10-CM | POA: Diagnosis present

## 2015-06-29 DIAGNOSIS — I10 Essential (primary) hypertension: Secondary | ICD-10-CM | POA: Diagnosis present

## 2015-06-29 DIAGNOSIS — I248 Other forms of acute ischemic heart disease: Secondary | ICD-10-CM | POA: Diagnosis present

## 2015-06-29 DIAGNOSIS — D72829 Elevated white blood cell count, unspecified: Secondary | ICD-10-CM | POA: Diagnosis present

## 2015-06-29 DIAGNOSIS — Z8673 Personal history of transient ischemic attack (TIA), and cerebral infarction without residual deficits: Secondary | ICD-10-CM

## 2015-06-29 DIAGNOSIS — G9340 Encephalopathy, unspecified: Secondary | ICD-10-CM | POA: Diagnosis present

## 2015-06-29 DIAGNOSIS — W19XXXA Unspecified fall, initial encounter: Secondary | ICD-10-CM | POA: Diagnosis not present

## 2015-06-29 DIAGNOSIS — L8990 Pressure ulcer of unspecified site, unspecified stage: Secondary | ICD-10-CM

## 2015-06-29 DIAGNOSIS — W19XXXD Unspecified fall, subsequent encounter: Secondary | ICD-10-CM | POA: Diagnosis not present

## 2015-06-29 DIAGNOSIS — R531 Weakness: Secondary | ICD-10-CM

## 2015-06-29 LAB — URINALYSIS, ROUTINE W REFLEX MICROSCOPIC
Bilirubin Urine: NEGATIVE
Glucose, UA: NEGATIVE mg/dL
Ketones, ur: 15 mg/dL — AB
LEUKOCYTES UA: NEGATIVE
NITRITE: NEGATIVE
PROTEIN: 30 mg/dL — AB
SPECIFIC GRAVITY, URINE: 1.021 (ref 1.005–1.030)
UROBILINOGEN UA: 0.2 mg/dL (ref 0.0–1.0)
pH: 5.5 (ref 5.0–8.0)

## 2015-06-29 LAB — CBC
HEMATOCRIT: 52.3 % — AB (ref 36.0–46.0)
HEMOGLOBIN: 17.7 g/dL — AB (ref 12.0–15.0)
MCH: 30.1 pg (ref 26.0–34.0)
MCHC: 33.8 g/dL (ref 30.0–36.0)
MCV: 88.9 fL (ref 78.0–100.0)
Platelets: 276 10*3/uL (ref 150–400)
RBC: 5.88 MIL/uL — AB (ref 3.87–5.11)
RDW: 12.8 % (ref 11.5–15.5)
WBC: 26.7 10*3/uL — AB (ref 4.0–10.5)

## 2015-06-29 LAB — DIFFERENTIAL
Basophils Absolute: 0 10*3/uL (ref 0.0–0.1)
Basophils Relative: 0 %
Eosinophils Absolute: 0 10*3/uL (ref 0.0–0.7)
Eosinophils Relative: 0 %
LYMPHS ABS: 0.8 10*3/uL (ref 0.7–4.0)
LYMPHS PCT: 3 %
MONOS PCT: 8 %
Monocytes Absolute: 2.1 10*3/uL — ABNORMAL HIGH (ref 0.1–1.0)
NEUTROS PCT: 89 %
Neutro Abs: 23.9 10*3/uL — ABNORMAL HIGH (ref 1.7–7.7)

## 2015-06-29 LAB — LACTIC ACID, PLASMA
LACTIC ACID, VENOUS: 2.2 mmol/L — AB (ref 0.5–2.0)
LACTIC ACID, VENOUS: 1.7 mmol/L (ref 0.5–2.0)

## 2015-06-29 LAB — BASIC METABOLIC PANEL
ANION GAP: 15 (ref 5–15)
BUN: 85 mg/dL — ABNORMAL HIGH (ref 6–20)
CO2: 31 mmol/L (ref 22–32)
Calcium: 10.4 mg/dL — ABNORMAL HIGH (ref 8.9–10.3)
Chloride: 100 mmol/L — ABNORMAL LOW (ref 101–111)
Creatinine, Ser: 1.41 mg/dL — ABNORMAL HIGH (ref 0.44–1.00)
GFR, EST AFRICAN AMERICAN: 38 mL/min — AB (ref >60)
GFR, EST NON AFRICAN AMERICAN: 33 mL/min — AB (ref >60)
GLUCOSE: 156 mg/dL — AB (ref 65–99)
POTASSIUM: 4.2 mmol/L (ref 3.5–5.1)
Sodium: 146 mmol/L — ABNORMAL HIGH (ref 135–145)

## 2015-06-29 LAB — I-STAT CG4 LACTIC ACID, ED: LACTIC ACID, VENOUS: 2.67 mmol/L — AB (ref 0.5–2.0)

## 2015-06-29 LAB — TROPONIN I: TROPONIN I: 0.11 ng/mL — AB (ref <0.031)

## 2015-06-29 LAB — HEPATIC FUNCTION PANEL
ALBUMIN: 3.5 g/dL (ref 3.5–5.0)
ALT: 32 U/L (ref 14–54)
AST: 61 U/L — AB (ref 15–41)
Alkaline Phosphatase: 91 U/L (ref 38–126)
Bilirubin, Direct: 0.2 mg/dL (ref 0.1–0.5)
Indirect Bilirubin: 0.9 mg/dL (ref 0.3–0.9)
TOTAL PROTEIN: 8 g/dL (ref 6.5–8.1)
Total Bilirubin: 1.1 mg/dL (ref 0.3–1.2)

## 2015-06-29 LAB — URINE MICROSCOPIC-ADD ON

## 2015-06-29 LAB — CK: Total CK: 919 U/L — ABNORMAL HIGH (ref 38–234)

## 2015-06-29 MED ORDER — SODIUM CHLORIDE 0.9 % IV BOLUS (SEPSIS)
1000.0000 mL | Freq: Once | INTRAVENOUS | Status: AC
  Administered 2015-06-29: 1000 mL via INTRAVENOUS

## 2015-06-29 MED ORDER — SODIUM CHLORIDE 0.9 % IV SOLN
INTRAVENOUS | Status: AC
  Administered 2015-06-29: 23:00:00 via INTRAVENOUS

## 2015-06-29 MED ORDER — ENOXAPARIN SODIUM 30 MG/0.3ML ~~LOC~~ SOLN
30.0000 mg | Freq: Every day | SUBCUTANEOUS | Status: DC
  Administered 2015-06-29 – 2015-06-30 (×2): 30 mg via SUBCUTANEOUS
  Filled 2015-06-29 (×4): qty 0.3

## 2015-06-29 MED ORDER — INFLUENZA VAC SPLIT QUAD 0.5 ML IM SUSY
0.5000 mL | PREFILLED_SYRINGE | INTRAMUSCULAR | Status: AC
  Administered 2015-06-30: 0.5 mL via INTRAMUSCULAR
  Filled 2015-06-29 (×2): qty 0.5

## 2015-06-29 MED ORDER — ACETAMINOPHEN 325 MG PO TABS
325.0000 mg | ORAL_TABLET | Freq: Every day | ORAL | Status: DC | PRN
  Administered 2015-07-02 – 2015-07-03 (×2): 325 mg via ORAL
  Filled 2015-06-29 (×2): qty 1

## 2015-06-29 MED ORDER — ASPIRIN EC 81 MG PO TBEC
81.0000 mg | DELAYED_RELEASE_TABLET | Freq: Every day | ORAL | Status: DC | PRN
  Filled 2015-06-29: qty 1

## 2015-06-29 MED ORDER — ENSURE ENLIVE PO LIQD
237.0000 mL | Freq: Two times a day (BID) | ORAL | Status: DC
  Administered 2015-06-30 – 2015-07-03 (×7): 237 mL via ORAL
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
## 2015-06-29 NOTE — ED Notes (Signed)
Unable to get 2nd set of blood cultures QNS, notified nurse

## 2015-06-29 NOTE — H&P (Signed)
History and Physical  Jasmine Fernandez VWU:[id number] DOB: November 29, 1928 DOA: 06/29/2015  PCP: Lupe Carney, MD   Chief Complaint: Fall / LOC  History of Present Illness:  Patient is a 81 year old female with history of HTN, rheumatic fever who was brought by EMS as she was found on floor in her apartment after family lost contact with her. Patient said the last thing she remember was being in the hospital ( she was in the ER on 10/4 and was discharged home). She was found with possible urine/stool incontinence. She has no complaints but had bruise on her back with decub ulcer on her buttock.    Review of Systems:  CONSTITUTIONAL:  No night sweats.  No fatigue, malaise, lethargy.  No fever or chills. Eyes:  No visual changes.  No eye pain.  No eye discharge.   ENT:    No epistaxis.  No sinus pain.  No sore throat.  No ear pain.  No congestion. RESPIRATORY:  No cough.  No wheeze.  No hemoptysis.  No shortness of breath. CARDIOVASCULAR:  No chest pains.  No palpitations. GASTROINTESTINAL:  No abdominal pain.  No nausea or vomiting.  No diarrhea or constipation.  No hematemesis.  No hematochezia.  No melena. GENITOURINARY:  No urgency.  No frequency.  No dysuria.  No hematuria.  No obstructive symptoms.  No discharge.  No pain.  No significant abnormal bleeding. MUSCULOSKELETAL:  No musculoskeletal pain.  No joint swelling.  No arthritis. NEUROLOGICAL:  No confusion.  No weakness. No headache. No seizure. PSYCHIATRIC:  No depression. No anxiety. No suicidal ideation. SKIN:  No rashes.  No lesions.  No wounds. ENDOCRINE:  No unexplained weight loss.  No polydipsia.  No polyuria.  No polyphagia. HEMATOLOGIC:  No anemia.  No purpura.  No petechiae.  No bleeding.  ALLERGIC AND IMMUNOLOGIC:  No pruritus.  No swelling Other:  Past Medical and Surgical History:   Past Medical History  Diagnosis Date  . Diabetes mellitus without complication (HCC)   . Hypertension   . Goiter   .  Aortic stenosis   . Rheumatic fever    Past Surgical History  Procedure Laterality Date  . Tonsillectomy      During Childhood    Social History:   reports that she has never smoked. She does not have any smokeless tobacco history on file. She reports that she does not drink alcohol or use illicit drugs.  Allergies  Allergen Reactions  . Penicillins Swelling    Has patient had a PCN reaction causing immediate rash, facial/tongue/throat swelling, SOB or lightheadedness with hypotension:no Has patient had a PCN reaction causing severe rash involving mucus membranes or skin necrosis:no Has patient had a PCN reaction that required hospitalization: no Has patient had a PCN reaction occurring within the last 10 years: No If all of the above answers are "NO", then may proceed with Cephalosporin use.     Family History  Problem Relation Age of Onset  . Heart disease Father   . Heart attack Father      Prior to Admission medications   Medication Sig Start Date End Date Taking? Authorizing Provider  indapamide (LOZOL) 1.25 MG tablet Take 1.25 mg by mouth every morning.   Yes Historical Provider, MD  losartan (COZAAR) 25 MG tablet Take 25 mg by mouth daily.   Yes Historical Provider, MD  TENORMIN 50 MG tablet Take 50 mg by mouth daily. 04/06/15  Yes Historical Provider, MD  Acetaminophen (TYLENOL PO)  Take 1 tablet by mouth daily as needed (pain).    Historical Provider, MD  aspirin EC 81 MG tablet Take 81 mg by mouth daily as needed for mild pain.    Historical Provider, MD  Multiple Vitamin (MULTIVITAMIN WITH MINERALS) TABS tablet Take 1 tablet by mouth daily.    Historical Provider, MD    Physical Exam: BP 156/70 mmHg  Pulse 105  Temp(Src) 99.4 F (37.4 C) (Rectal)  Resp 16  Ht  (1.651 m)  Wt 68.04 kg (150 lb)  BMI 24.96 kg/m2  SpO2 95%  GENERAL :appears to be in no acute distress. HEAD: normocephalic. EYES: PERRL, EOMI.  EARS:  hearing grossly intact. NOSE: No nasal  discharge. THROAT: Oral cavity and pharynx normal.  NECK: Neck supple. CARDIAC: Normal S1 and S2. No S3, S4. Systolic murmur on LSB. Rhythm is regular. LUNGS: Clear to auscultation . ABDOMEN: Positive bowel sounds. Soft, nondistended, nontender. No guarding or rebound. No masses. MUSKULOSKELETAL:. No joint erythema or tenderness.  EXTREMITIES: No significant deformity or joint abnormality. NEUROLOGICAL: The mental examination revealed the patient was oriented to person, place, and time.CN II-XII intact.  SKIN: bruise on upper back, un-stagable ulcer on buttock.  PSYCHIATRIC:  The patient was able to demonstrate good judgement and reason, without hallucinations, abnormal affect or abnormal behaviors during the examination. Patient is not suicidal.          Labs on Admission:  Reviewed.   Radiological Exams on Admission: Dg Chest 2 View  06/29/2015   CLINICAL DATA:  Acute confusion and lethargy.  EXAM: CHEST  2 VIEW  COMPARISON:  04/04/2006 chest radiograph  FINDINGS: The cardiomediastinal silhouette is unremarkable.  There is no evidence of focal airspace disease, pulmonary edema, suspicious pulmonary nodule/mass, pleural effusion, or pneumothorax. No acute bony abnormalities are identified.  IMPRESSION: No active cardiopulmonary disease.   Electronically Signed   By: Harmon Pier M.D.   On: 06/29/2015 19:53    EKG:  Independently reviewed.  Assessment/Plan  Fall with LOC: Mechanical vs. Stroke vs. Seizure vs. Sepsis CT head without acute abnormality Leukocytosis suggestive of sepsis vs severe dehydration No identified source of infection and no symptoms Will hold on abx: get bcx and ucx Continue IVF Mild rhabdomyolysis  Creatinine at baseline    H/o CVA:  Continue asp   DVT prophylaxis: Gully enoxaparin  Code Status: Full  Eston Esters M.D [facility]

## 2015-06-29 NOTE — ED Notes (Signed)
Patient transported to X-ray 

## 2015-06-29 NOTE — ED Notes (Signed)
Bed: WA25 Expected date:  Expected time:  Means of arrival:  Comments: ems  
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
## 2015-06-29 NOTE — ED Notes (Signed)
Attempted to in and out cath the pt, without urine return. Bladder scanner shows 37ml, will attempt again.
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
## 2015-06-29 NOTE — ED Notes (Signed)
Unable to collect labs at this time per nurse wait about 20 minutes until fluids go in.

## 2015-06-29 NOTE — ED Notes (Signed)
MD at bedside. 

## 2015-06-29 NOTE — ED Notes (Signed)
Provider notified of abnormal lactic acid. 

## 2015-06-30 ENCOUNTER — Encounter (HOSPITAL_COMMUNITY): Payer: Self-pay | Admitting: General Surgery

## 2015-06-30 DIAGNOSIS — N179 Acute kidney failure, unspecified: Secondary | ICD-10-CM | POA: Diagnosis not present

## 2015-06-30 DIAGNOSIS — E440 Moderate protein-calorie malnutrition: Secondary | ICD-10-CM

## 2015-06-30 DIAGNOSIS — W19XXXD Unspecified fall, subsequent encounter: Secondary | ICD-10-CM

## 2015-06-30 LAB — COMPREHENSIVE METABOLIC PANEL
ALK PHOS: 70 U/L (ref 38–126)
ALT: 23 U/L (ref 14–54)
ANION GAP: 7 (ref 5–15)
AST: 41 U/L (ref 15–41)
Albumin: 2.6 g/dL — ABNORMAL LOW (ref 3.5–5.0)
BILIRUBIN TOTAL: 1.1 mg/dL (ref 0.3–1.2)
BUN: 60 mg/dL — AB (ref 6–20)
CALCIUM: 9.1 mg/dL (ref 8.9–10.3)
CO2: 29 mmol/L (ref 22–32)
Chloride: 112 mmol/L — ABNORMAL HIGH (ref 101–111)
Creatinine, Ser: 1.05 mg/dL — ABNORMAL HIGH (ref 0.44–1.00)
GFR calc Af Amer: 55 mL/min — ABNORMAL LOW (ref >60)
GFR, EST NON AFRICAN AMERICAN: 47 mL/min — AB (ref >60)
Glucose, Bld: 180 mg/dL — ABNORMAL HIGH (ref 65–99)
POTASSIUM: 3.4 mmol/L — AB (ref 3.5–5.1)
Sodium: 148 mmol/L — ABNORMAL HIGH (ref 135–145)
TOTAL PROTEIN: 5.9 g/dL — AB (ref 6.5–8.1)

## 2015-06-30 LAB — CBC WITH DIFFERENTIAL/PLATELET
BASOS ABS: 0 10*3/uL (ref 0.0–0.1)
BASOS PCT: 0 %
EOS PCT: 0 %
Eosinophils Absolute: 0 10*3/uL (ref 0.0–0.7)
HCT: 42.3 % (ref 36.0–46.0)
Hemoglobin: 13.9 g/dL (ref 12.0–15.0)
LYMPHS PCT: 3 %
Lymphs Abs: 0.7 10*3/uL (ref 0.7–4.0)
MCH: 29.4 pg (ref 26.0–34.0)
MCHC: 32.9 g/dL (ref 30.0–36.0)
MCV: 89.4 fL (ref 78.0–100.0)
Monocytes Absolute: 2 10*3/uL — ABNORMAL HIGH (ref 0.1–1.0)
Monocytes Relative: 8 %
NEUTROS ABS: 22.1 10*3/uL — AB (ref 1.7–7.7)
Neutrophils Relative %: 89 %
PLATELETS: 227 10*3/uL (ref 150–400)
RBC: 4.73 MIL/uL (ref 3.87–5.11)
RDW: 12.7 % (ref 11.5–15.5)
WBC: 24.7 10*3/uL — AB (ref 4.0–10.5)

## 2015-06-30 LAB — VITAMIN B12: VITAMIN B 12: 1449 pg/mL — AB (ref 180–914)

## 2015-06-30 LAB — TROPONIN I
TROPONIN I: 0.1 ng/mL — AB (ref <0.031)
TROPONIN I: 0.09 ng/mL — AB (ref <0.031)

## 2015-06-30 LAB — TSH: TSH: 0.873 u[IU]/mL (ref 0.350–4.500)

## 2015-06-30 LAB — MAGNESIUM: MAGNESIUM: 2.5 mg/dL — AB (ref 1.7–2.4)

## 2015-06-30 MED ORDER — ASPIRIN 300 MG RE SUPP
300.0000 mg | Freq: Every day | RECTAL | Status: DC
  Administered 2015-06-30 – 2015-07-03 (×4): 300 mg via RECTAL
  Filled 2015-06-30 (×4): qty 1

## 2015-06-30 MED ORDER — POTASSIUM CHLORIDE CRYS ER 20 MEQ PO TBCR: 40.0000 meq | EXTENDED_RELEASE_TABLET | Freq: Once | ORAL | Status: DC

## 2015-06-30 MED ORDER — ENOXAPARIN SODIUM 30 MG/0.3ML ~~LOC~~ SOLN: 30.0000 mg | SUBCUTANEOUS | Status: DC

## 2015-06-30 MED ORDER — ATENOLOL 50 MG PO TABS
50.0000 mg | ORAL_TABLET | Freq: Every day | ORAL | Status: DC
  Administered 2015-06-30 – 2015-07-03 (×4): 50 mg via ORAL
  Filled 2015-06-30 (×4): qty 1

## 2015-06-30 MED ORDER — LEVOFLOXACIN IN D5W 750 MG/150ML IV SOLN
750.0000 mg | INTRAVENOUS | Status: DC
  Administered 2015-06-30: 750 mg via INTRAVENOUS
  Filled 2015-06-30 (×2): qty 150

## 2015-06-30 MED ORDER — VANCOMYCIN HCL IN DEXTROSE 1-5 GM/200ML-% IV SOLN
1000.0000 mg | INTRAVENOUS | Status: DC
  Administered 2015-06-30 – 2015-07-01 (×2): 1000 mg via INTRAVENOUS
  Filled 2015-06-30 (×3): qty 200

## 2015-06-30 MED ORDER — SODIUM CHLORIDE 0.45 % IV SOLN
INTRAVENOUS | Status: DC
  Administered 2015-06-30 – 2015-07-01 (×3): via INTRAVENOUS

## 2015-06-30 MED ORDER — COLLAGENASE 250 UNIT/GM EX OINT
TOPICAL_OINTMENT | Freq: Every day | CUTANEOUS | Status: DC
  Administered 2015-06-30 – 2015-07-03 (×4): via TOPICAL
  Filled 2015-06-30 (×2): qty 30

## 2015-06-30 MED ORDER — POTASSIUM CHLORIDE CRYS ER 20 MEQ PO TBCR
40.0000 meq | EXTENDED_RELEASE_TABLET | Freq: Every day | ORAL | Status: DC
  Administered 2015-06-30 – 2015-07-03 (×4): 40 meq via ORAL
  Filled 2015-06-30 (×4): qty 2

## 2015-06-30 MED ORDER — ASPIRIN 325 MG PO TABS
325.0000 mg | ORAL_TABLET | Freq: Every day | ORAL | Status: DC
  Filled 2015-06-30: qty 1

## 2015-06-30 NOTE — Clinical Social Work Placement (Signed)
   CLINICAL SOCIAL WORK PLACEMENT  NOTE  Date:  06/30/2015  Patient Details  Name: Jasmine Fernandez MRN: [id number] Date of Birth: April 25, 1929  Clinical Social Work is seeking post-discharge placement for this patient at the Skilled  Nursing Facility level of care (*CSW will initial, date and re-position this form in  chart as items are completed):  Yes   Patient/family provided with [facility] Clinical Social Work Department's list of facilities offering this level of care within the geographic area requested by the patient (or if unable, by the patient's family).  Yes   Patient/family informed of their freedom to choose among providers that offer the needed level of care, that participate in Medicare, Medicaid or managed care program needed by the patient, have an available bed and are willing to accept the patient.  Yes   Patient/family informed of [facility]'s ownership interest in [facility] and [facility], as well as of the fact that they are under no obligation to receive care at these facilities.  PASRR submitted to EDS on 06/30/15     PASRR number received on       Existing PASRR number confirmed on       FL2 transmitted to all facilities in geographic area requested by pt/family on 06/30/15     FL2 transmitted to all facilities within larger geographic area on       Patient informed that his/her managed care company has contracts with or will negotiate with certain facilities, including the following:            Patient/family informed of bed offers received.  Patient chooses bed at       Physician recommends and patient chooses bed at      Patient to be transferred to   on  .  Patient to be transferred to facility by       Patient family notified on   of transfer.  Name of family member notified:        PHYSICIAN       Additional Comment:    _______________________________________________ Loleta Dicker, LCSW 06/30/2015, 4:29 PM

## 2015-06-30 NOTE — Progress Notes (Signed)
ANTIBIOTIC CONSULT NOTE - INITIAL  Pharmacy Consult for Vancomycin, Levofloxacin Indication: Wound Infection  Allergies  Allergen Reactions  . Penicillins Swelling    Has patient had a PCN reaction causing immediate rash, facial/tongue/throat swelling, SOB or lightheadedness with hypotension: YES Has patient had a PCN reaction causing severe rash involving mucus membranes or skin necrosis:no Has patient had a PCN reaction that required hospitalization: no Has patient had a PCN reaction occurring within the last 10 years: No If all of the above answers are "NO", then may proceed with Cephalosporin use.     Patient Measurements: Height:  (165.1 cm) Weight: 150 lb (68.04 kg) IBW/kg (Calculated) : 57  Vital Signs: Temp: 98.8 F (37.1 C) (10/11 0503) Temp Source: Oral (10/11 0503) BP: 130/58 mmHg (10/11 0503) Pulse Rate: 99 (10/11 0503) Intake/Output from previous day: 10/10 0701 - 10/11 0700 In: 2081.3 [I.V.:81.3; IV Piggyback:2000] Out: 125 [Urine:125] Intake/Output from this shift: Total I/O In: 480 [P.O.:480] Out: -   Labs:  Recent Labs  06/29/15 1722 06/30/15 0600  WBC 26.7* 24.7*  HGB 17.7* 13.9  PLT 276 227  CREATININE 1.41* 1.05*   Estimated Creatinine Clearance: 35.9 mL/min (by C-G formula based on Cr of 1.05). No results for input(s): VANCOTROUGH, VANCOPEAK, VANCORANDOM, GENTTROUGH, GENTPEAK, GENTRANDOM, TOBRATROUGH, TOBRAPEAK, TOBRARND, AMIKACINPEAK, AMIKACINTROU, AMIKACIN in the last 72 hours.   Microbiology: Recent Results (from the past 720 hour(s))  Urine culture     Status: None   Collection Time: 06/22/15  4:01 PM  Result Value Ref Range Status   Specimen Description URINE, RANDOM  Final   Special Requests NONE  Final   Culture   Final    MULTIPLE SPECIES PRESENT, SUGGEST RECOLLECTION Performed at [facility]    Report Status 06/24/2015 FINAL  Final  Blood culture (routine x 2)     Status: None (Preliminary result)   Collection  Time: 06/29/15  5:25 PM  Result Value Ref Range Status   Specimen Description BLOOD LEFT ANTECUBITAL  Final   Special Requests BOTTLES DRAWN AEROBIC AND ANAEROBIC 5CC   Final   Culture   Final    NO GROWTH < 24 HOURS Performed at [facility]    Report Status PENDING  Incomplete  Blood culture (routine x 2)     Status: None (Preliminary result)   Collection Time: 06/29/15  7:05 PM  Result Value Ref Range Status   Specimen Description BLOOD RIGHT ANTECUBITAL  Final   Special Requests BOTTLES DRAWN AEROBIC AND ANAEROBIC 5CC   Final   Culture   Final    NO GROWTH < 24 HOURS Performed at [facility]    Report Status PENDING  Incomplete    Medical History: Past Medical History  Diagnosis Date  . Diabetes mellitus without complication (HCC)   . Hypertension   . Goiter   . Aortic stenosis   . Rheumatic fever      Assessment: 14 y/oF with PMH of DM, HTN, AS who was found down at home and brought to [facility] ED on 06/29/15. Patient was found to have AKI, dehydration, leukocytosis, and decub wound. Pharmacy consulted to dose Vancomycin and Levofloxacin for possible wound infection.   10/11 >> Vancomycin >> 10/11 >> Levofloxacin >>    10/10 blood x 2: NGTD 10/10 urine: sent   Tmax 99.12F WBC elevated at 24.7K SCr improved to 1.05 with CrCl ~ 36 ml/min CG  Goal of Therapy:  Vancomycin trough level 15-20 mcg/ml  Appropriate antibiotic dosing  for renal function and indication Eradication of infection  Plan:   Vancomycin 1g IV q24h.  Plan for Vancomycin trough level at steady state.  Levofloxacin  IV q48h.  Monitor renal function, cultures, clinical course.    Greer Pickerel, PharmD, BCPS Pager: [phone] 06/30/2015 2:47 PM

## 2015-06-30 NOTE — Progress Notes (Signed)
Pt refused  PO aspirin. She stated that it makes her stomach upset. Notified MD. New order placed.

## 2015-06-30 NOTE — Consult Note (Signed)
Jasmine Fernandez [birth year]  [id number].    Requesting MD: Dr. Domenic Polite Chief Complaint/Reason for Consult: left gluteal wound HPI: This is an 81 yo black female with a h/o HTN, DM, AS who was found down at home after she took a fall at least 2 days ago.  She was brought to [facility] where she was admitted.  She has a mild elevation of her CK indicating at least a mild rhabdo.  She has a WBC of 24K with an unknown source.  She has been found to have a pressure wound on her left gluteus that we have been asked to see.  ROS : please see HPI, otherwise negative.  She does have some forgetfulness/dementia  Family History  Problem Relation Age of Onset  . Heart disease Father   . Heart attack Father     Past Medical History  Diagnosis Date  . Diabetes mellitus without complication ([place])   . Hypertension   . Goiter   . Aortic stenosis   . Rheumatic fever     Past Surgical History  Procedure Laterality Date  . Tonsillectomy      During Childhood    Social History:  reports that she has never smoked. She does not have any smokeless tobacco history on file. She reports that she does not drink alcohol or use illicit drugs.  Allergies:  Allergies  Allergen Reactions  . Penicillins Swelling    Has patient had a PCN reaction causing immediate rash, facial/tongue/throat swelling, SOB or lightheadedness with hypotension:no Has patient had a PCN reaction causing severe rash involving mucus membranes or skin necrosis:no Has patient had a PCN reaction that required hospitalization: no Has patient had a PCN reaction occurring within the last 10 years: No If all of the above answers are "NO", then may proceed with Cephalosporin use.     Medications Prior to Admission  Medication Sig Dispense Refill  . indapamide (LOZOL) 1.25 MG tablet Take 1.25 mg by mouth every morning.    Marland Kitchen losartan (COZAAR) 25 MG tablet Take 25 mg by mouth daily.    . TENORMIN 50 MG tablet Take 50 mg by mouth daily.   0  . Acetaminophen (TYLENOL PO) Take 1 tablet by mouth daily as needed (pain).    Marland Kitchen aspirin EC 81 MG tablet Take 81 mg by mouth daily as needed for mild pain.    . Multiple Vitamin (MULTIVITAMIN WITH MINERALS) TABS tablet Take 1 tablet by mouth daily.      Blood pressure 130/58, pulse 99, temperature 98.8 F (37.1 C), temperature source Oral, resp. rate 20, height _0  (1.651 m), weight 68.04 kg (150 lb), SpO2 92 %. Physical Exam: General: pleasant, WD, WN black female who is laying in bed in NAD Heart: regular, rate, and rhythm.  Normal s1,s2. No obvious gallops, or rubs noted, but she has a loud murmur.  Palpable radial and pedal pulses bilaterally Lungs: CTAB, no wheezes, rhonchi, or rales noted.  Respiratory effort nonlabored Abd: soft, NT, ND, +BS, no masses, hernias, or organomegaly Skin: warm and dry with no masses, lesions, or rashes.  She has a large area of eschar noted on her left buttock from pressure.  This is not soft or mushy as if there is any infection or fluctuance underneath this eschar.  No erythema or outward evidence of infection currently.  This measures about 8x8cm.  No drainage Psych: A&Ox3 with somewhat flat affect.    Results for orders placed or performed during the hospital  encounter of 06/29/15 (from the past 48 hour(s))  Basic metabolic panel     Status: Abnormal   Collection Time: 06/29/15  5:22 PM  Result Value Ref Range   Sodium 146 (H) 135 - 145 mmol/L   Potassium 4.2 3.5 - 5.1 mmol/L   Chloride 100 (L) 101 - 111 mmol/L   CO2 31 22 - 32 mmol/L   Glucose, Bld 156 (H) 65 - 99 mg/dL   BUN 85 (H) 6 - 20 mg/dL   Creatinine, Ser 1.41 (H) 0.44 - 1.00 mg/dL   Calcium 10.4 (H) 8.9 - 10.3 mg/dL   GFR calc non Af Amer 33 (L) >60 mL/min   GFR calc Af Amer 38 (L) >60 mL/min    Comment: (NOTE) The eGFR has been calculated using the CKD EPI equation. This calculation has not been validated in all clinical situations. eGFR's persistently <60 mL/min signify  possible Chronic Kidney Disease.    Anion gap 15 5 - 15  CBC     Status: Abnormal   Collection Time: 06/29/15  5:22 PM  Result Value Ref Range   WBC 26.7 (H) 4.0 - 10.5 K/uL   RBC 5.88 (H) 3.87 - 5.11 MIL/uL   Hemoglobin 17.7 (H) 12.0 - 15.0 g/dL   HCT 52.3 (H) 36.0 - 46.0 %   MCV 88.9 78.0 - 100.0 fL   MCH 30.1 26.0 - 34.0 pg   MCHC 33.8 30.0 - 36.0 g/dL   RDW 12.8 11.5 - 15.5 %   Platelets 276 150 - 400 K/uL  Differential     Status: Abnormal   Collection Time: 06/29/15  5:22 PM  Result Value Ref Range   Neutrophils Relative % 89 %   Neutro Abs 23.9 (H) 1.7 - 7.7 K/uL   Lymphocytes Relative 3 %   Lymphs Abs 0.8 0.7 - 4.0 K/uL   Monocytes Relative 8 %   Monocytes Absolute 2.1 (H) 0.1 - 1.0 K/uL   Eosinophils Relative 0 %   Eosinophils Absolute 0.0 0.0 - 0.7 K/uL   Basophils Relative 0 %   Basophils Absolute 0.0 0.0 - 0.1 K/uL  Blood culture (routine x 2)     Status: None (Preliminary result)   Collection Time: 06/29/15  5:25 PM  Result Value Ref Range   Specimen Description BLOOD LEFT ANTECUBITAL    Special Requests BOTTLES DRAWN AEROBIC AND ANAEROBIC 5CC     Culture      NO GROWTH < 24 HOURS Performed at [facility]    Report Status PENDING   Hepatic function panel     Status: Abnormal   Collection Time: 06/29/15  5:39 PM  Result Value Ref Range   Total Protein 8.0 6.5 - 8.1 g/dL   Albumin 3.5 3.5 - 5.0 g/dL   AST 61 (H) 15 - 41 U/L   ALT 32 14 - 54 U/L   Alkaline Phosphatase 91 38 - 126 U/L   Total Bilirubin 1.1 0.3 - 1.2 mg/dL   Bilirubin, Direct 0.2 0.1 - 0.5 mg/dL   Indirect Bilirubin 0.9 0.3 - 0.9 mg/dL  Lactic acid, plasma     Status: Abnormal   Collection Time: 06/29/15  5:39 PM  Result Value Ref Range   Lactic Acid, Venous 2.2 (HH) 0.5 - 2.0 mmol/L    Comment: CRITICAL RESULT CALLED TO, READ BACK BY AND VERIFIED WITH: DAVIS N RN AT 1818 ON 10.10.16 BY MENDOZA B    Troponin I     Status: Abnormal  Collection Time: 06/29/15  5:39 PM  Result  Value Ref Range   Troponin I 0.11 (H) <0.031 ng/mL    Comment:        PERSISTENTLY INCREASED TROPONIN VALUES IN THE RANGE OF 0.04-0.49 ng/mL CAN BE SEEN IN:       -UNSTABLE ANGINA       -CONGESTIVE HEART FAILURE       -MYOCARDITIS       -CHEST TRAUMA       -ARRYHTHMIAS       -LATE PRESENTING MYOCARDIAL INFARCTION       -COPD   CLINICAL FOLLOW-UP RECOMMENDED.   CK     Status: Abnormal   Collection Time: 06/29/15  5:39 PM  Result Value Ref Range   Total CK 919 (H) 38 - 234 U/L  I-Stat CG4 Lactic Acid, ED     Status: Abnormal   Collection Time: 06/29/15  5:44 PM  Result Value Ref Range   Lactic Acid, Venous 2.67 (HH) 0.5 - 2.0 mmol/L   Comment NOTIFIED PHYSICIAN   Urinalysis, Routine w reflex microscopic (not at [facility])     Status: Abnormal   Collection Time: 06/29/15  6:42 PM  Result Value Ref Range   Color, Urine YELLOW YELLOW   APPearance CLOUDY (A) CLEAR   Specific Gravity, Urine 1.021 1.005 - 1.030   pH 5.5 5.0 - 8.0   Glucose, UA NEGATIVE NEGATIVE mg/dL   Hgb urine dipstick SMALL (A) NEGATIVE   Bilirubin Urine NEGATIVE NEGATIVE   Ketones, ur 15 (A) NEGATIVE mg/dL   Protein, ur 30 (A) NEGATIVE mg/dL   Urobilinogen, UA 0.2 0.0 - 1.0 mg/dL   Nitrite NEGATIVE NEGATIVE   Leukocytes, UA NEGATIVE NEGATIVE  Urine microscopic-add on     Status: Abnormal   Collection Time: 06/29/15  6:42 PM  Result Value Ref Range   Squamous Epithelial / LPF FEW (A) RARE   WBC, UA 0-2 <3 WBC/hpf   RBC / HPF 0-2 <3 RBC/hpf   Bacteria, UA FEW (A) RARE   Casts HYALINE CASTS (A) NEGATIVE  Blood culture (routine x 2)     Status: None (Preliminary result)   Collection Time: 06/29/15  7:05 PM  Result Value Ref Range   Specimen Description BLOOD RIGHT ANTECUBITAL    Special Requests BOTTLES DRAWN AEROBIC AND ANAEROBIC 5CC     Culture      NO GROWTH < 24 HOURS Performed at [facility]    Report Status PENDING   Lactic acid, plasma     Status: None   Collection Time: 06/29/15  8:26 PM   Result Value Ref Range   Lactic Acid, Venous 1.7 0.5 - 2.0 mmol/L  Comprehensive metabolic panel     Status: Abnormal   Collection Time: 06/30/15  6:00 AM  Result Value Ref Range   Sodium 148 (H) 135 - 145 mmol/L   Potassium 3.4 (L) 3.5 - 5.1 mmol/L    Comment: DELTA CHECK NOTED REPEATED TO VERIFY    Chloride 112 (H) 101 - 111 mmol/L   CO2 29 22 - 32 mmol/L   Glucose, Bld 180 (H) 65 - 99 mg/dL   BUN 60 (H) 6 - 20 mg/dL   Creatinine, Ser 1.05 (H) 0.44 - 1.00 mg/dL   Calcium 9.1 8.9 - 10.3 mg/dL   Total Protein 5.9 (L) 6.5 - 8.1 g/dL   Albumin 2.6 (L) 3.5 - 5.0 g/dL   AST 41 15 - 41 U/L   ALT 23 14 - 54  U/L   Alkaline Phosphatase 70 38 - 126 U/L   Total Bilirubin 1.1 0.3 - 1.2 mg/dL   GFR calc non Af Amer 47 (L) >60 mL/min   GFR calc Af Amer 55 (L) >60 mL/min    Comment: (NOTE) The eGFR has been calculated using the CKD EPI equation. This calculation has not been validated in all clinical situations. eGFR's persistently <60 mL/min signify possible Chronic Kidney Disease.    Anion gap 7 5 - 15  CBC WITH DIFFERENTIAL     Status: Abnormal   Collection Time: 06/30/15  6:00 AM  Result Value Ref Range   WBC 24.7 (H) 4.0 - 10.5 K/uL   RBC 4.73 3.87 - 5.11 MIL/uL   Hemoglobin 13.9 12.0 - 15.0 g/dL    Comment: REPEATED TO VERIFY DELTA CHECK NOTED    HCT 42.3 36.0 - 46.0 %   MCV 89.4 78.0 - 100.0 fL   MCH 29.4 26.0 - 34.0 pg   MCHC 32.9 30.0 - 36.0 g/dL   RDW 12.7 11.5 - 15.5 %   Platelets 227 150 - 400 K/uL   Neutrophils Relative % 89 %   Neutro Abs 22.1 (H) 1.7 - 7.7 K/uL   Lymphocytes Relative 3 %   Lymphs Abs 0.7 0.7 - 4.0 K/uL   Monocytes Relative 8 %   Monocytes Absolute 2.0 (H) 0.1 - 1.0 K/uL   Eosinophils Relative 0 %   Eosinophils Absolute 0.0 0.0 - 0.7 K/uL   Basophils Relative 0 %   Basophils Absolute 0.0 0.0 - 0.1 K/uL  Magnesium     Status: Abnormal   Collection Time: 06/30/15  6:00 AM  Result Value Ref Range   Magnesium 2.5 (H) 1.7 - 2.4 mg/dL  TSH      Status: None   Collection Time: 06/30/15 10:56 AM  Result Value Ref Range   TSH 0.873 0.350 - 4.500 uIU/mL   Dg Chest 2 View  06/29/2015   CLINICAL DATA:  Acute confusion and lethargy.  EXAM: CHEST  2 VIEW  COMPARISON:  04/04/2006 chest radiograph  FINDINGS: The cardiomediastinal silhouette is unremarkable.  There is no evidence of focal airspace disease, pulmonary edema, suspicious pulmonary nodule/mass, pleural effusion, or pneumothorax. No acute bony abnormalities are identified.  IMPRESSION: No active cardiopulmonary disease.   Electronically Signed   By: Margarette Canada M.D.   On: 06/29/2015 19:53       Assessment/Plan 1. unstageable left gluteus pressure ulcer -agree with WOC recommendations.  Santyl to this wound and hydrotherapy are recommended to help debride this eschar.  Obviously with the eschar in place it is difficult to definitively say, but this does not currently appear to be the source of her elevated WBC.   -will follow to make sure she doesn't need any sharp debridement in the future. -thank you for this consultation.  Olvin Rohr E 06/30/2015, 1:30 PM Pager: [phone]

## 2015-06-30 NOTE — Progress Notes (Addendum)
[facility] PROGRESS NOTE  Jasmine Fernandez XLK:[id number] DOB: March 09, 1929 DOA: 06/29/2015 PCP: Lupe Carney, MD   81 yo female with a h/o HTN, DM, AS who was found down at home, unknown time down,  She was brought to [facility] where she was admitted. Unable to get a reliable history, found to have AKI, dehydration, WBC 26K and decub wounds. She lives alone and mostly independent at baseline  Assessment/Plan: 1. Fall/Found down -unable to obtain meaningful history/details about this -Ambulate, PT to see  2. AKI /Hypernatremia -due to dehydration, volume depletion -BUN significantly up -change IVf to 1/2NS, monitor Bmet  3. Dehydration -as above  4. Leukocytosis -unclear if related to decub wound, no other clear focus of infection, no fevers -has large wound with eschar on L buttock, will order Hydrotherapy, Wound RN consult appreciated, will ask CCS to eval to make sure that is appropriate -start IV Vanc, and Zosyn -FU Blood CX and urine Cx  5. Sacral decubitus ulcer/eschar -present prior to admission -see above  5. Suspected Dementia/Cognitive dysfunction.Marland Kitchensuspect a component of encephalopathy from infection, dehydration too -check TSH, B12 -quite functional at baseline per sister and grandson,   6. DM -hold metformin, SSI -check Hbaic  7. HTN -resume atenolol, hold ARB  8. Elevated troponin -no chest pain, EKG with LVH and repol -suspect demand related, trend troponin, check ECHO -add ASA and resume Atenolol  Code Status: Full Code Family Communication: d/w sister and grandson  Disposition Plan: will likely need placement    HPI/Subjective: No complaints, flat affect, unable to give meaningful history  Objective: Filed Vitals:   06/30/15 0503  BP: 130/58  Pulse: 99  Temp: 98.8 F (37.1 C)  Resp: 20    Intake/Output Summary (Last 24 hours) at 06/30/15 1129 Last data filed at 06/30/15 1033  Gross per 24 hour  Intake 2561.25 ml  Output    125 ml   Net 2436.25 ml   Filed Weights   06/29/15 1647  Weight: 68.04 kg (150 lb)    Exam:   General:  AAOx to self and place only  Cardiovascular: S1S2/RRR, systolic murmur  Respiratory: CTAB  Abdomen: soft, NT, BS present  Musculoskeletal: no edema c/c, R knee with mild swelling and painful RoM  Sacrun: L upper buttock with large   Data Reviewed: Basic Metabolic Panel:  Recent Labs Lab 06/23/15 1642 06/29/15 1722 06/30/15 0600  NA 130* 146* 148*  K 4.9 4.2 3.4*  CL 97* 100* 112*  CO2 GLUCOSE 106* 156* 180*  BUN 14 85* 60*  CREATININE 0.86 1.41* 1.05*  CALCIUM 9.7 10.4* 9.1   Liver Function Tests:  Recent Labs Lab 06/29/15 1739 06/30/15 0600  AST 61* 41  ALT 32 23  ALKPHOS 91 70  BILITOT 1.1 1.1  PROT 8.0 5.9*  ALBUMIN 3.5 2.6*   No results for input(s): LIPASE, AMYLASE in the last 168 hours. No results for input(s): AMMONIA in the last 168 hours. CBC:  Recent Labs Lab 06/23/15 1642 06/29/15 1722 06/30/15 0600  WBC 14.7* 26.7* 24.7*  NEUTROABS 11.8* 23.9* 22.1*  HGB 14.9 17.7* 13.9  HCT 43.1 52.3* 42.3  MCV 85.9 88.9 89.4  PLT 281 276 227   Cardiac Enzymes:  Recent Labs Lab 06/29/15 1739  CKTOTAL 919*  TROPONINI 0.11*   BNP (last 3 results) No results for input(s): BNP in the last 8760 hours.  ProBNP (last 3 results) No results for input(s): PROBNP in the last 8760 hours.  CBG:  No results for input(s): GLUCAP in the last 168 hours.  Recent Results (from the past 240 hour(s))  Urine culture     Status: None   Collection Time: 06/22/15  4:01 PM  Result Value Ref Range Status   Specimen Description URINE, RANDOM  Final   Special Requests NONE  Final   Culture   Final    MULTIPLE SPECIES PRESENT, SUGGEST RECOLLECTION Performed at [facility]    Report Status 06/24/2015 FINAL  Final     Studies: Dg Chest 2 View  06/29/2015   CLINICAL DATA:  Acute confusion and lethargy.  EXAM: CHEST  2 VIEW  COMPARISON:   04/04/2006 chest radiograph  FINDINGS: The cardiomediastinal silhouette is unremarkable.  There is no evidence of focal airspace disease, pulmonary edema, suspicious pulmonary nodule/mass, pleural effusion, or pneumothorax. No acute bony abnormalities are identified.  IMPRESSION: No active cardiopulmonary disease.   Electronically Signed   By: Harmon Pier M.D.   On: 06/29/2015 19:53    Scheduled Meds: . enoxaparin (LOVENOX) injection  30 mg Subcutaneous QHS  . feeding supplement (ENSURE ENLIVE)  237 mL Oral BID BM  . potassium chloride  40 mEq Oral Once   Continuous Infusions: . sodium chloride 100 mL/hr at 06/30/15 1029   Antibiotics Given (last 72 hours)    None      Active Problems:   Weakness   Pressure ulcer   Fall    Time spent:    [facility]  [facility] Pager [phone]. If 7PM-7AM, please contact night-coverage at [email or web address], password [facility] 06/30/2015, 11:29 AM  LOS: 1 day

## 2015-06-30 NOTE — Clinical Social Work Note (Signed)
Clinical Social Work Assessment  Patient Details  Name: Jasmine Fernandez MRN: [id number] Date of Birth: Oct 19, 1928  Date of referral:  06/30/15               Reason for consult:  Discharge Planning                Permission sought to share information with:  Case Manager, Family Supports Permission granted to share information::  Yes, Verbal Permission Granted  Name::      (Reggie Beulah Gandy and Tonita Phoenix)  Agency::     Relationship::   Yolanda Bonine and daughter)  Sport and exercise psychologist Information:   Nevin Bloodgood: [phone])  Housing/Transportation Living arrangements for the past 2 months:  [place] of Information:  Patient, Other (Comment Required) (Grandson Lynelle Smoke) Patient Interpreter Needed:  None Criminal Activity/Legal Involvement Pertinent to Current Situation/Hospitalization:  No - Comment as needed Significant Relationships:  Adult Children, Other Family Members Lives with:  Self Do you feel safe going back to the place where you live?  Yes Need for family participation in patient care:  Yes (Comment)  Care giving concerns:  Grandson Reggie voiced concerns about the patient being at home alone. Informed CSW that he would need to talk with other family members to help the patient come up with a decision.    Social Worker assessment / plan:  CSW received consult to speak with patient regarding skilled nursing plan for short term rehab.  CSW met with pt and pt grandson at bedside. Patient was alert and oriented x4. Pt states that she thinks she was dehydrated and fell out. Patient now open to short term rehab at skilled nursing to regain overall strength. Pt provided permission to speak with daughter. Patient interested in [place] for short term rehab.  Pt was hesitant about going to a facility, as she would prefer to go home. CSW, and pt grandson provided support and discussed patients need for further assistance at this time.   Employment status:  Disabled (Comment  on whether or not currently receiving Disability) Insurance information:  Medicare, Programmer, applications [employer]) PT Recommendations:  Not assessed at this time Information / Referral to community resources:  [place]  Patient/Family's Response to care:  Pt thanked csw for concern and support. Pt motivated for short term rehab in hopes of returning home after rehab.   Patient/Family's Understanding of and Emotional Response to Diagnosis, Current Treatment, and Prognosis:  Pt verbalized understanding of current diagnosis, treatment, and prognosis. Pt is hopeful to discharge to short term rehab once medically stable.   Emotional Assessment Appearance:  Appears stated age Attitude/Demeanor/Rapport:  Guarded (Calm and Cooperative) Affect (typically observed):  Accepting, Appropriate, Calm, Guarded, Pleasant Orientation:  Oriented to Self, Oriented to Place, Oriented to  Time, Oriented to Situation Alcohol / Substance use:  Not Applicable Psych involvement (Current and /or in the community):  No (Comment)  Discharge Needs  Concerns to be addressed:  Discharge Planning Concerns Readmission within the last 30 days:  No Current discharge risk:  Physical Impairment Barriers to Discharge:  Other (Patient acknowledges that she will need assistance, however , is hesitant about going to a SNF or receiving home health)   Raymondo Band, LCSW 06/30/2015, 4:20 PM

## 2015-06-30 NOTE — Consult Note (Addendum)
WOC wound consult note Reason for Consult: Pressure injuries, partial thickness and Unstageable Wound type:pressure Pressure Ulcer POA: Yes Measurement: left buttocks:  9cm x 10cm area with stable black, soft eschar in center measuring 8cm x 7.5cm.  Malodorous, not fluctuant.  14cm x 0.4cm linear scratch laterally to the Unstageable ulcer.  Right hip:  2cm x 3.5cm x 0.2cm partial thickness tissue loss with pink, moist wound bed with scant serous exudate.  Right lateral LE 2cm x 5cm x 0.2cm partial thickness tissue loss with pink, moist wound bed and scant serous exudate.  Right lateral heel:  4cm x 3cm ulcer with wound bed and depth obscured by the presence of dried serum (vs eschar). No exudate. unstageable. Wound bed:As described above Drainage (amount, consistency, odor) As described above Periwound:intact, dry Dressing procedure/placement/frequency: I have consulted with Dr. Jomarie Longs and we agree that a CCS consult is indicated.  In the meantime, we have ordered hydrotherapy and an enzymatic debriding agent (Santyl) to be applied daily.  PT can provide conservative sharp wound debridement (CSWD) as indicated following treatments and prior to debriding agent application. A therapeutic mattress is provided with low air loss feature as well as bilateral pressure redistribution heel boots. WOC nursing team will not follow, but will remain available to this patient, the nursing and medical teams.  Please re-consult if needed. Thanks, Ladona Mow, MSN, RN, GNP, [place], [facility] [phone])

## 2015-06-30 NOTE — Plan of Care (Signed)
RN, Belenda Cruise, paged this NP because pt was refusing the  dose of ASA because it upsets her stomach. She was on  ASA at home without issue. Increased dose deemed necessary for heart issues. Changed ASA to suppository and asked RN to explain why increased dose is necessary.  KJKG, NP [facility]

## 2015-06-30 NOTE — Care Management Note (Signed)
Case Management Note  Patient Details  Name: DESHARA ROSSI MRN: [id number] Date of Birth: Feb 21, 1929  Subjective/Objective:      urosepsis              Action/Plan:Date:  Oct. 11, 2016 U.R. performed for needs and level of care. Will continue to follow for Case Management needs.  Marcelle Smiling, RN, BSN, [place]   [phone]   Expected Discharge Date:   (unknown)               Expected Discharge Plan:  Skilled Nursing Facility  In-House Referral:  Clinical Social Work  Discharge planning Services  CM Consult  Post Acute Care Choice:  NA Choice offered to:  NA  DME Arranged:    DME Agency:     HH Arranged:    HH Agency:     Status of Service:  In process, will continue to follow  Medicare Important Message Given:    Date Medicare IM Given:    Medicare IM give by:    Date Additional Medicare IM Given:    Additional Medicare Important Message give by:     If discussed at Long Length of Stay Meetings, dates discussed:    Additional Comments:  Golda Acre, RN 06/30/2015, 2:41 PM

## 2015-06-30 NOTE — Progress Notes (Addendum)
Patient had more than 10 beat run Vtach with no signs & symptoms. Notified MD. New orders placed.  Continue to monitor

## 2015-06-30 NOTE — Progress Notes (Signed)
Initial Nutrition Assessment   DOCUMENTATION CODES:   Non-severe (moderate) malnutrition in context of social or environmental circumstances  INTERVENTION:  - Continue Ensure Enlive po BID, each supplement provides 350 kcal and 20 grams of protein - Down grade diet to appropriate dysphagia level; pt does not have dentures present and was pocketing food during RD visit - RD will continue to monitor for needs  NUTRITION DIAGNOSIS:   Biting/chewing difficulty related to other (see comment) (no teeth and dentures not present) as evidenced by per patient/family report.  GOAL:   Patient will meet greater than or equal to 90% of their needs  MONITOR:   PO intake, Supplement acceptance, Weight trends, Labs, Skin, I & O's  REASON FOR ASSESSMENT:   Malnutrition Screening Tool  ASSESSMENT:   81 year old female with history of HTN, rheumatic fever who was brought by EMS as she was found on floor in her apartment after family lost contact with her. Patient said the last thing she remember was being in the hospital ( she was in the ER on 10/4 and was discharged home). She was found with possible urine/stool incontinence. She has no complaints but had bruise on her back with decub ulcer on her buttock.   Pt seen for MST. BMI indicates overweight status. Per chart review, pt ate 25% of breakfast this AM. She had eaten a few bites of fish, rice, and cooked carrots at time of RD visit but states she is not feeling hungry. She denies abdominal pain or nausea at this time. Pt states that she has dentures but she is unsure of where they are. Pt was noted to be pocketing food during discussion with RD; RD was able to have pt spit food bolus into napkin.   Pt's sister and grandson were present in the room at time of visit. They state that pt has lost weight over the past 3-6 months due to increased activity. They are unsure of UBW or amount of weight lost and pt unable to contribute to this topic.  Moderate muscle and fat wasting noted during physical assessment.   Unable to obtain further information concerning eating habits PTA. Unsure if pt was meeting needs. Ensure Enlive order already in for BID. Medications reviewed. Labs reviewed; Na: 148 mmol/L, K: 3.4 mmol/L, Cl: 112 mmol/L, BUN/creatinine elevated but trending down, GFR: 55.   Diet Order:  Diet regular Room service appropriate?: Yes; Fluid consistency:: Thin  Skin:   MASD to L buttocks  Last BM:  10/10  Height:   Ht Readings from Last 1 Encounters:  06/29/15  (1.651 m)    Weight:   Wt Readings from Last 1 Encounters:  06/29/15 150 lb (68.04 kg)    Ideal Body Weight:  56.82 kg (kg)  BMI:  Body mass index is 24.96 kg/(m^2).  Estimated Nutritional Needs:   Kcal:  1350-1550  Protein:  55-65 grams  Fluid:  2-2.2 L/day  EDUCATION NEEDS:   No education needs identified at this time     Trenton Gammon, RD, LDN Inpatient Clinical Dietitian Pager # [phone] After hours/weekend pager # [phone]

## 2015-07-01 ENCOUNTER — Inpatient Hospital Stay (HOSPITAL_COMMUNITY): Payer: Medicare PPO

## 2015-07-01 DIAGNOSIS — L8990 Pressure ulcer of unspecified site, unspecified stage: Secondary | ICD-10-CM

## 2015-07-01 DIAGNOSIS — E119 Type 2 diabetes mellitus without complications: Secondary | ICD-10-CM

## 2015-07-01 DIAGNOSIS — E440 Moderate protein-calorie malnutrition: Secondary | ICD-10-CM

## 2015-07-01 DIAGNOSIS — R531 Weakness: Secondary | ICD-10-CM

## 2015-07-01 DIAGNOSIS — W19XXXA Unspecified fall, initial encounter: Secondary | ICD-10-CM

## 2015-07-01 DIAGNOSIS — N179 Acute kidney failure, unspecified: Secondary | ICD-10-CM

## 2015-07-01 DIAGNOSIS — D72829 Elevated white blood cell count, unspecified: Secondary | ICD-10-CM

## 2015-07-01 DIAGNOSIS — I350 Nonrheumatic aortic (valve) stenosis: Secondary | ICD-10-CM

## 2015-07-01 LAB — URINE CULTURE: Culture: NO GROWTH

## 2015-07-01 LAB — BASIC METABOLIC PANEL
ANION GAP: 6 (ref 5–15)
BUN: 36 mg/dL — AB (ref 6–20)
CHLORIDE: 106 mmol/L (ref 101–111)
CO2: 28 mmol/L (ref 22–32)
CREATININE: 0.94 mg/dL (ref 0.44–1.00)
Calcium: 8.9 mg/dL (ref 8.9–10.3)
GFR calc Af Amer: >60 mL/min (ref >60)
GFR, EST NON AFRICAN AMERICAN: 54 mL/min — AB (ref >60)
Glucose, Bld: 132 mg/dL — ABNORMAL HIGH (ref 65–99)
POTASSIUM: 3.7 mmol/L (ref 3.5–5.1)
SODIUM: 140 mmol/L (ref 135–145)

## 2015-07-01 LAB — CBC
HCT: 38.2 % (ref 36.0–46.0)
HEMOGLOBIN: 12.4 g/dL (ref 12.0–15.0)
MCH: 29.2 pg (ref 26.0–34.0)
MCHC: 32.5 g/dL (ref 30.0–36.0)
MCV: 90.1 fL (ref 78.0–100.0)
Platelets: 195 10*3/uL (ref 150–400)
RBC: 4.24 MIL/uL (ref 3.87–5.11)
RDW: 12.9 % (ref 11.5–15.5)
WBC: 19.7 10*3/uL — AB (ref 4.0–10.5)

## 2015-07-01 LAB — HEMOGLOBIN A1C
Hgb A1c MFr Bld: 6.3 % — ABNORMAL HIGH (ref 4.8–5.6)
Mean Plasma Glucose: 134 mg/dL

## 2015-07-01 MED ORDER — SACCHAROMYCES BOULARDII 250 MG PO CAPS
250.0000 mg | ORAL_CAPSULE | Freq: Two times a day (BID) | ORAL | Status: DC
  Administered 2015-07-01 – 2015-07-03 (×4): 250 mg via ORAL
  Filled 2015-07-01 (×6): qty 1

## 2015-07-01 MED ORDER — ENOXAPARIN SODIUM 40 MG/0.4ML ~~LOC~~ SOLN
40.0000 mg | SUBCUTANEOUS | Status: DC
  Administered 2015-07-01 – 2015-07-02 (×2): 40 mg via SUBCUTANEOUS
  Filled 2015-07-01 (×3): qty 0.4

## 2015-07-01 NOTE — Progress Notes (Signed)
NUTRITION NOTE  New consult for assessment of nutrition needs and requirements received. Pt seen by RD 06/30/15 with note written at 1338. Diet has now been downgraded to Dysphagia 3 with Thin liquids. Ensure Enlive was ordered BID yesterday.   RD will continue to follow per protocol.      Trenton GammonJessica Quaran Kedzierski, RD, LDN Inpatient Clinical Dietitian Pager # [phone] After hours/weekend pager # [phone]

## 2015-07-01 NOTE — Progress Notes (Signed)
Patient ID: Jasmine Fernandez, female   DOB: July 22, 1929, 81 y.o.   MRN: [id number]  General Surgery [facility]  Subjective: Patient comfortable in room, examined with PT hydrotherapy team.  Objective: Vital signs in last 24 hours: Temp:  [98.1 F (36.7 C)-98.8 F (37.1 C)] 98.1 F (36.7 C) (10/12 0523) Pulse Rate:  [75-79] 76 (10/12 0920) Resp:  [20] 20 (10/12 0523) BP: (144-154)/(40-60) 148/60 mmHg (10/12 0920) SpO2:  [96 %-98 %] 98 % (10/12 0523) Last BM Date: 06/29/15  Intake/Output from previous day: 10/11 0701 - 10/12 0700 In: 2671.7 [P.O.:720; I.V.:1751.7; IV Piggyback:200] Out: 500 [Urine:500] Intake/Output this shift: Total I/O In: 300 [P.O.:300] Out: -   Physical Exam: Left medial buttock with large area of pressure necrosis, superficial, no fluctuence  Wound probed at edges and centrally with forceps with no drainage or loose skin  No sign of abscess or cellulitis  Lab Results:   Recent Labs  06/30/15 0600 07/01/15 0548  WBC 24.7* 19.7*  HGB 13.9 12.4  HCT 42.3 38.2  PLT 227 195   BMET  Recent Labs  06/30/15 0600 07/01/15 0548  NA 148* 140  K 3.4* 3.7  CL 112* 106  CO2 29 28  GLUCOSE 180* 132*  BUN 60* 36*  CREATININE 1.05* 0.94  CALCIUM 9.1 8.9   PT/INR No results for input(s): LABPROT, INR in the last 72 hours. Comprehensive Metabolic Panel:    Component Value Date/Time   NA 140 07/01/2015 0548   NA 148* 06/30/2015 0600   K 3.7 07/01/2015 0548   K 3.4* 06/30/2015 0600   CL 106 07/01/2015 0548   CL 112* 06/30/2015 0600   CO2 28 07/01/2015 0548   CO2 29 06/30/2015 0600   BUN 36* 07/01/2015 0548   BUN 60* 06/30/2015 0600   CREATININE 0.94 07/01/2015 0548   CREATININE 1.05* 06/30/2015 0600   GLUCOSE 132* 07/01/2015 0548   GLUCOSE 180* 06/30/2015 0600   CALCIUM 8.9 07/01/2015 0548   CALCIUM 9.1 06/30/2015 0600   AST 41 06/30/2015 0600   AST 61* 06/29/2015 1739   ALT 23 06/30/2015 0600   ALT 32 06/29/2015 1739    ALKPHOS 70 06/30/2015 0600   ALKPHOS 91 06/29/2015 1739   BILITOT 1.1 06/30/2015 0600   BILITOT 1.1 06/29/2015 1739   PROT 5.9* 06/30/2015 0600   PROT 8.0 06/29/2015 1739   ALBUMIN 2.6* 06/30/2015 0600   ALBUMIN 3.5 06/29/2015 1739    Studies/Results: Dg Chest 2 View  06/29/2015  CLINICAL DATA:  Acute confusion and lethargy. EXAM: CHEST  2 VIEW COMPARISON:  04/04/2006 chest radiograph FINDINGS: The cardiomediastinal silhouette is unremarkable. There is no evidence of focal airspace disease, pulmonary edema, suspicious pulmonary nodule/mass, pleural effusion, or pneumothorax. No acute bony abnormalities are identified. IMPRESSION: No active cardiopulmonary disease. Electronically Signed   By: Harmon Pier M.D.   On: 06/29/2015 19:53    Anti-infectives: Anti-infectives    Start     Dose/Rate Route Frequency Ordered Stop   06/30/15 1600  vancomycin (VANCOCIN) IVPB 1000 mg/200 mL premix     1,000 mg 200 mL/hr over 60 Minutes Intravenous Every 24 hours 06/30/15 1418     06/30/15 1500  levofloxacin (LEVAQUIN) IVPB 750 mg     750 mg 100 mL/hr over 90 Minutes Intravenous Every 48 hours 06/30/15 1439        Assessment & Plans: Apparent early stage decubitus ulcer medial left buttock  No role for operative debridement at this time  Continue wound care, hydrotherapy  Difficult to accurately stage at this point  Will follow  Velora Hecklerodd M. Jenness Stemler, MD, [facility] Office: [phone]   Natacia Chaisson Judie PetitM 07/01/2015

## 2015-07-01 NOTE — Progress Notes (Signed)
OT Cancellation Note  Patient Details Name: Jasmine Fernandez MRN: [id number] DOB: 12/18/1928   Cancelled Treatment:    Reason Eval/Treat Not Completed: Other (comment).  Pt is very sleepy and unable to keep eyes open. Will check back later today.  Kamika Goodloe 07/01/2015, 8:11 AM  Marica OtterMaryellen Timisha Mondry, OTR/L [phone] 07/01/2015

## 2015-07-01 NOTE — Evaluation (Signed)
Occupational Therapy Evaluation Patient Details Name: Jasmine Fernandez MRN: [id number] DOB: Nov 17, 1928 Today's Date: 07/01/2015    History of Present Illness pt was admitted for weakness. She was found on floor by family members   Clinical Impression   This 81 year old female was admitted for the above. She was living at home independently prior to admission, and she now needs up to total A for LB adls.  Did not attempt transfer with A x 1 today for safety as pt is weak.  Goals in acute are for min to mod A (+2 A for transfer).      Follow Up Recommendations  SNF    Equipment Recommendations  3 in 1 bedside comode    Recommendations for Other Services       Precautions / Restrictions Precautions Precautions: Fall Restrictions Weight Bearing Restrictions: No      Mobility Bed Mobility Overal bed mobility: Needs Assistance Bed Mobility: Rolling;Sidelying to Sit;Sit to Supine Rolling: Max assist Sidelying to sit: Max assist       General bed mobility comments: cues for sequence and assist for legs and trunk  Transfers                 General transfer comment: not attempted    Balance Overall balance assessment: Needs assistance Sitting-balance support: Single extremity supported Sitting balance-Leahy Scale: Poor (min guard for safety; )                                      ADL Overall ADL's : Needs assistance/impaired Eating/Feeding: Sitting (min guard for balance)   Grooming: Set up;Bed level   Upper Body Bathing: Minimal assitance;Bed level   Lower Body Bathing: Total assistance;Bed level   Upper Body Dressing : Minimal assistance;Bed level   Lower Body Dressing: Total assistance;Bed level                 General ADL Comments: sat eob with min A initially for balance then min guard.  worked on breakfast 20 min at EOB. Did not feel safe standing without 2nd person to assist     Vision     Perception     Praxis       Pertinent Vitals/Pain Pain Assessment: No/denies pain     Hand Dominance Right   Extremity/Trunk Assessment Upper Extremity Assessment Upper Extremity Assessment: Generalized weakness (grossly 3-/5 to /5)           Communication Communication Communication: No difficulties (soft spoken)   Cognition Arousal/Alertness: Awake/alert (poor night sleep) Behavior During Therapy: WFL for tasks assessed/performed Overall Cognitive Status: Impaired/Different from baseline Area of Impairment: Following commands;Problem solving       Following Commands: Follows one step commands with increased time     Problem Solving: Slow processing;Requires verbal cues General Comments: pt oriented x4--didn't sleep well; slow processing/extra time   General Comments       Exercises       Shoulder Instructions      Home Living Family/patient expects to be discharged to:: Unsure                                        Prior Functioning/Environment Level of Independence: Independent             OT Diagnosis: Generalized weakness  OT Problem List: Decreased strength;Decreased activity tolerance;Impaired balance (sitting and/or standing);Decreased knowledge of use of DME or AE   OT Treatment/Interventions: Self-care/ADL training;DME and/or AE instruction;Patient/family education;Balance training    OT Goals(Current goals can be found in the care plan section) Acute Rehab OT Goals OT Goal Formulation: With patient Time For Goal Achievement: 07/15/15 Potential to Achieve Goals: Good ADL Goals Pt Will Transfer to Toilet: with mod assist;with +2 assist;bedside commode;stand pivot transfer Additional ADL Goal #1: pt will go from sit to stand with mod A and maintain for 2 minutes with min A for adls Additional ADL Goal #2: Pt will perform UB adls from eob with supervision/set up and no more than 1 vc to initiate Additional ADL Goal #3: Pt will perform bed mobility with  min A in preparation for adls/toilet transfers  OT Frequency: Min 1X/week   Barriers to D/C:            Co-evaluation              End of Session    Activity Tolerance: Patient tolerated treatment well Patient left: in bed;with call bell/phone within reach;with bed alarm set   Time: 1025-1055 OT Time Calculation (min): 30 min Charges:  OT General Charges $OT Visit: 1 Procedure OT Evaluation $Initial OT Evaluation Tier I: 1 Procedure OT Treatments $Self Care/Home Management : 8-22 mins G-Codes:    Maximo Spratling Jul 14, 2015, 11:25 AM Marica Otter, OTR/L [phone] Jul 14, 2015

## 2015-07-01 NOTE — Care Management Important Message (Signed)
Important Message  Patient Details  Name: Jasmine Fernandez MRN: [id number] Date of Birth: September 03, 1929   Medicare Important Message Given:  Yes-second notification given    Haskell FlirtJamison, Elky Funches 07/01/2015, 11:36 AMImportant Message  Patient Details  Name: Jasmine Fernandez MRN: [id number] Date of Birth: September 03, 1929   Medicare Important Message Given:  Yes-second notification given    Haskell FlirtJamison, Teren Zurcher 07/01/2015, 11:35 AM

## 2015-07-01 NOTE — Progress Notes (Signed)
[facility] PROGRESS NOTE    Progress Note   Jasmine Fernandez ZOX:[id number] DOB: [birth year] DOA: 06/29/2015 PCP: Lupe Carney, MD   Brief Narrative:   Jasmine Fernandez is an 81 y.o. female with history of hypertension and diabetes as well as aortic stenosis with found down at home she was brought to the ED and was found to be in acute renal failure with a white count 26 and a decubitus ulcer unable to obtain a history from patient. She lives alone mostly independent.  Assessment/Plan:  AKI (acute kidney injury) (HCC): Likely prerenal, now resolved with IV hydration. KVO IV fluids.  Fall: He relates she doesn't remember she didn't she trip she is nonfocal on physical exam. Unable to obtain history from patient, PT evaluation pending at this point. Patient agreed to go to rehabilitation center if needed.  Diabetes mellitus without complication (HCC) A single lumen A1c 6.3 she has been well regulated without insulin. Continue to hold metformin. No episodes of hypoglycemia in house.  Unstageable left gluteus Pressure ulcer: Wound care was consulted who recommended hydrotherapy and enzymatic debridement. Surgery was consulted and agreed with plan. She has remained febrile no growth in cultures till date.    Malnutrition of moderate degree: Nutrition consult.    Leukocytosis: She has remained afebrile throughout her hospital stay she was started on bank and Levaquin on admission, surgery does not think that her ulcer is a cause of her fever cultures have remained negative till date. Start floor store.  Acute encephalopathy: Likely due to dehydration, TSH and B-12 with a normal. We'll have to be evaluated as an outpatient for cognitive impairment/dementia.  Elevated troponins: No chest pain EKG with LVH and T-wave inversions likely due to early repull suspect demand ischemia continue aspirin and beta blockers.    Weakness    DVT Prophylaxis - Lovenox  ordered.  Family Communication: none Disposition Plan: Home when stable. Code Status:     Code Status Orders        Start     Ordered   06/29/15 2219  Full code   Continuous     06/29/15 2218        IV Access:    Peripheral IV   Procedures and diagnostic studies:   Dg Chest 2 View  06/29/2015  CLINICAL DATA:  Acute confusion and lethargy. EXAM: CHEST  2 VIEW COMPARISON:  04/04/2006 chest radiograph FINDINGS: The cardiomediastinal silhouette is unremarkable. There is no evidence of focal airspace disease, pulmonary edema, suspicious pulmonary nodule/mass, pleural effusion, or pneumothorax. No acute bony abnormalities are identified. IMPRESSION: No active cardiopulmonary disease. Electronically Signed   By: Harmon Pier M.D.   On: 06/29/2015 19:53     Medical Consultants:    None.  Anti-Infectives:   Anti-infectives    Start     Dose/Rate Route Frequency Ordered Stop   06/30/15 1600  vancomycin (VANCOCIN) IVPB 1000 mg/200 mL premix     1,000 mg 200 mL/hr over 60 Minutes Intravenous Every 24 hours 06/30/15 1418     06/30/15 1500  levofloxacin (LEVAQUIN) IVPB 750 mg     750 mg 100 mL/hr over 90 Minutes Intravenous Every 48 hours 06/30/15 1439        Subjective:    Jasmine Fernandez she has no new complaints she relates she feels much better than yesterday.  Objective:    Filed Vitals:   06/29/15 2324 06/30/15 0503 06/30/15 2103 07/01/15 0523  BP: 155/66 130/58 154/40 144/47  Pulse: 106  99 79 75  Temp: 98.5 F (36.9 C) 98.8 F (37.1 C) 98.8 F (37.1 C) 98.1 F (36.7 C)  TempSrc: Oral Oral Oral Oral  Resp: 20 20 20 20   Height:      Weight:      SpO2: 100% 92% 96% 98%    Intake/Output Summary (Last 24 hours) at 07/01/15 1046 Last data filed at 07/01/15 0811  Gross per 24 hour  Intake 2491.67 ml  Output    500 ml  Net 1991.67 ml   Filed Weights   06/29/15 1647  Weight: 68.04 kg (150 lb)    Exam: Gen:  NAD Cardiovascular:  RRR, No  M/R/G Respiratory:  Good air movement and clear to auscultation Abdomen:  Abdomen soft, NT/ND, + BS Extremities:  No C/E/C   Data Reviewed:    Labs: Basic Metabolic Panel:  Recent Labs Lab 06/29/15 1722 06/30/15 0600 07/01/15 0548  NA 146* 148* 140  K 4.2 3.4* 3.7  CL 100* 112* 106  CO2 31 29 28   GLUCOSE 156* 180* 132*  BUN 85* 60* 36*  CREATININE 1.41* 1.05* 0.94  CALCIUM 10.4* 9.1 8.9  MG  --  2.5*  --    GFR Estimated Creatinine Clearance: 40.1 mL/min (by C-G formula based on Cr of 0.94). Liver Function Tests:  Recent Labs Lab 06/29/15 1739 06/30/15 0600  AST 61* 41  ALT 32 23  ALKPHOS 91 70  BILITOT 1.1 1.1  PROT 8.0 5.9*  ALBUMIN 3.5 2.6*   No results for input(s): LIPASE, AMYLASE in the last 168 hours. No results for input(s): AMMONIA in the last 168 hours. Coagulation profile No results for input(s): INR, PROTIME in the last 168 hours.  CBC:  Recent Labs Lab 06/29/15 1722 06/30/15 0600 07/01/15 0548  WBC 26.7* 24.7* 19.7*  NEUTROABS 23.9* 22.1*  --   HGB 17.7* 13.9 12.4  HCT 52.3* 42.3 38.2  MCV 88.9 89.4 90.1  PLT 276 227 195   Cardiac Enzymes:  Recent Labs Lab 06/29/15 1739 06/30/15 1435 06/30/15 2022  CKTOTAL 919*  --   --   TROPONINI 0.11* 0.10* 0.09*   BNP (last 3 results) No results for input(s): PROBNP in the last 8760 hours. CBG: No results for input(s): GLUCAP in the last 168 hours. D-Dimer: No results for input(s): DDIMER in the last 72 hours. Hgb A1c:  Recent Labs  06/30/15 0600  HGBA1C 6.3*   Lipid Profile: No results for input(s): CHOL, HDL, LDLCALC, TRIG, CHOLHDL, LDLDIRECT in the last 72 hours. Thyroid function studies:  Recent Labs  06/30/15 1056  TSH 0.873   Anemia work up:  Recent Labs  06/30/15 1056  VITAMINB12 1449*   Sepsis Labs:  Recent Labs Lab 06/29/15 1722 06/29/15 1739 06/29/15 1744 06/29/15 2026 06/30/15 0600 07/01/15 0548  WBC 26.7*  --   --   --  24.7* 19.7*  LATICACIDVEN   --  2.2* 2.67* 1.7  --   --    Microbiology Recent Results (from the past 240 hour(s))  Urine culture     Status: None   Collection Time: 06/22/15  4:01 PM  Result Value Ref Range Status   Specimen Description URINE, RANDOM  Final   Special Requests NONE  Final   Culture   Final    MULTIPLE SPECIES PRESENT, SUGGEST RECOLLECTION [facility]    Report Status 06/24/2015 FINAL  Final  Blood culture (routine x 2)     Status: None (Preliminary result)   Collection  Time: 06/29/15  5:25 PM  Result Value Ref Range Status   Specimen Description BLOOD LEFT ANTECUBITAL  Final   Special Requests BOTTLES DRAWN AEROBIC AND ANAEROBIC 5CC   Final   Culture   Final    NO GROWTH < 24 HOURS Performed at [facility]    Report Status PENDING  Incomplete  Urine culture     Status: None   Collection Time: 06/29/15  6:43 PM  Result Value Ref Range Status   Specimen Description URINE, CLEAN CATCH  Final   Special Requests NONE  Final   Culture   Final    NO GROWTH 2 DAYS Performed at [facility]    Report Status 07/01/2015 FINAL  Final  Blood culture (routine x 2)     Status: None (Preliminary result)   Collection Time: 06/29/15  7:05 PM  Result Value Ref Range Status   Specimen Description BLOOD RIGHT ANTECUBITAL  Final   Special Requests BOTTLES DRAWN AEROBIC AND ANAEROBIC 5CC   Final   Culture   Final    NO GROWTH < 24 HOURS Performed at [facility]    Report Status PENDING  Incomplete     Medications:   . aspirin  300 mg Rectal Daily  . atenolol  50 mg Oral Daily  . collagenase   Topical Daily  . enoxaparin (LOVENOX) injection  40 mg Subcutaneous Q24H  . feeding supplement (ENSURE ENLIVE)  237 mL Oral BID BM  . levofloxacin (LEVAQUIN) IV  750 mg Intravenous Q48H  . potassium chloride  40 mEq Oral Daily  . vancomycin  1,000 mg Intravenous Q24H   Continuous Infusions: . sodium chloride 100 mL/hr at 07/01/15 0629    Time spent: 25  min   LOS: 2 days   Marinda Elk  [facility] Pager [phone]  *Please refer to [email or web address], password [email or web address] to get updated schedule on who will round on this patient, as hospitalists switch teams weekly. If 7PM-7AM, please contact night-coverage at [email or web address], password [email or web address] for any overnight needs.  07/01/2015, 10:46 AM

## 2015-07-01 NOTE — Progress Notes (Signed)
Echocardiogram 2D Echocardiogram has been performed.  Dorothey BasemanReel, Vedansh Kerstetter M 07/01/2015, 1:39 PM

## 2015-07-01 NOTE — Evaluation (Signed)
Physical Therapy Evaluation Patient Details Name: Jasmine Fernandez MRN: [id number] DOB: 10-03-1928 Today's Date: 07/01/2015   History of Present Illness  pt was admitted 06/29/15 for weakness. She was found on floor by family members  Clinical Impression  Pt admitted with above diagnosis. Pt currently with functional limitations due to the deficits listed below (see PT Problem List).  Pt will benefit from skilled PT to increase their independence and safety with mobility to allow discharge to the venue listed below.  Will follow; pt will need SNF    Follow Up Recommendations SNF;Supervision/Assistance - 24 hour    Equipment Recommendations    none   Recommendations for Other Services       Precautions / Restrictions Precautions Precautions: Fall Precaution Comments: multiple wounds      Mobility  Bed Mobility Overal bed mobility: Needs Assistance Bed Mobility: Rolling;Sidelying to Sit;Sit to Supine Rolling: Max assist Sidelying to sit: +2 for physical assistance;Max assist   Sit to supine: +2 for physical assistance;Total assist   General bed mobility comments: assist for trunk and  LEs  onto and off bed, requires incr time, has difficulty sequencing  Transfers     Transfers: Sit to/from Stand Sit to Stand: +2 physical assistance;Total assist         General transfer comment: unable with +2 assist  Ambulation/Gait                Stairs            Wheelchair Mobility    Modified Rankin (Stroke Patients Only)       Balance Overall balance assessment: Needs assistance;History of Falls Sitting-balance support: Bilateral upper extremity supported;Feet supported Sitting balance-Leahy Scale: Poor Sitting balance - Comments: poor to zero, pt able to briefly maintain static sit with incr time and cues for posture and trunk positioning                                     Pertinent Vitals/Pain Pain Assessment: No/denies pain     Home Living Family/patient expects to be discharged to:: Unsure Living Arrangements: Alone                    Prior Function Level of Independence: Independent               Hand Dominance        Extremity/Trunk Assessment   Upper Extremity Assessment: Defer to OT evaluation;Generalized weakness           Lower Extremity Assessment: Generalized weakness         Communication      Cognition Arousal/Alertness: Lethargic (arouses easily) Behavior During Therapy: Flat affect Overall Cognitive Status: Impaired/Different from baseline Area of Impairment: Following commands;Problem solving;Awareness;Safety/judgement     Memory: Decreased short-term memory Following Commands: Follows one step commands inconsistently;Follows one step commands with increased time Safety/Judgement: Decreased awareness of deficits   Problem Solving: Slow processing;Requires verbal cues;Decreased initiation;Difficulty sequencing;Requires tactile cues General Comments: pt very slow to respond, unable to sequence without specific multi-modal cueing to come to EOB; pt can't recall events leading up to hospital admission, states she thinks she remembers falling    General Comments      Exercises        Assessment/Plan    PT Assessment Patient needs continued PT services  PT Diagnosis Generalized weakness   PT Problem List Decreased strength;Decreased activity tolerance;Decreased  mobility;Decreased balance  PT Treatment Interventions DME instruction;Gait training;Functional mobility training;Therapeutic activities;Therapeutic exercise;Patient/family education   PT Goals (Current goals can be found in the Care Plan section) Acute Rehab PT Goals Patient Stated Goal: pt does not state PT Goal Formulation: Patient unable to participate in goal setting Time For Goal Achievement: 07/15/15 Potential to Achieve Goals: Fair    Frequency Min 3X/week   Barriers to discharge         Co-evaluation               End of Session   Activity Tolerance: Patient limited by lethargy Patient left: in bed;with call bell/phone within reach;with bed alarm set Nurse Communication: Mobility status         Time: [id number] PT Time Calculation (min) (ACUTE ONLY): 18 min   Charges:   PT Evaluation $Initial PT Evaluation Tier I: 1 Procedure     PT G CodesDrucilla Chalet:        Salem Lembke 07/01/2015, 2:52 PM

## 2015-07-01 NOTE — Progress Notes (Signed)
 **  PT--HYDROTHERAPY EVALUATION**    07/01/15     1400  Subjective Assessment  Patient and Family Stated Goals none stated  Date of Onset 06/29/15 (??--present on admission)  Prior Treatments dressed with foam adhesive dressing  Evaluation and Treatment  Evaluation and Treatment Procedures Explained to Patient/Family Yes  Evaluation and Treatment Procedures agreed to (? pt's ability to consent although nodded yes)  Pressure Ulcer 07/01/15 Unstageable - Full thickness tissue loss in which the base of the ulcer is covered by slough (yellow, tan, gray, green or brown) and/or eschar (tan, brown or black) in the wound bed. PT--L glut/sacral  pressure wound   Date First Assessed/Time First Assessed: 07/01/15 1415   Location: Buttocks  Location Orientation: Left  Staging: Unstageable - Full thickness tissue loss in which the base of the ulcer is covered by slough (yellow, tan, gray, green or brown) and/or e...  Dressing Type Moist to dry;Other (Comment) (santyl)  Dressing Changed  Dressing Change Frequency Daily  State of Healing Eschar  Site / Wound Assessment Brown;Yellow  % Wound base Red or Granulating (2%)  % Wound base Yellow (1%)  % Wound base Black ((&%)  % Wound base Other (Comment) (unable to visualize)  Peri-wound Assessment Intact  Wound Length (cm) 10.1 cm  Wound Width (cm) 6.2 cm  Wound Depth (cm) (unable to determine)  Drainage Amount Scant  Drainage Description Purulent;Odor  Treatment Cleansed;Hydrotherapy (Pulse lavage);Packing (Saline gauze);Other (Comment) (santyl)  Hydrotherapy  Pulsed lavage therapy - wound location L buttocks/sacrum  Pulsed Lavage with Suction (psi) 8 psi (to 12)  Pulsed Lavage with Suction - Normal Saline Used 1000 mL  Pulsed Lavage Tip Tip with splash shield  Wound Therapy - Assess/Plan/Recommendations  Wound Therapy - Clinical Statement P wtih unstageable pressure ulcer, covered in slough/eschar and will benefit from pulsed lavage and sharps  debridement to facilitate wound healing  Wound Therapy - Functional Problem List generalized weakness, limited mobility  Factors Delaying/Impairing Wound Healing Immobility;Multiple medical problems  Hydrotherapy Plan Debridement;Dressing change;Patient/family education;Pulsatile lavage with suction  Wound Therapy - Frequency 6X / week  Wound Therapy - Follow Up Recommendations Skilled nursing facility  Wound Plan see above  Wound Therapy Goals - Improve the function of patient's integumentary system by progressing the wound(s) through the phases of wound healing by:  Decrease Necrotic Tissue to 80  Decrease Necrotic Tissue - Progress Goal set today  Increase Granulation Tissue to 20  Increase Granulation Tissue - Progress Goal set today  Goals/treatment plan/discharge plan were made with and agreed upon by patient/family No, Patient unable to participate in goals/treatment/discharge plan and family unavailable  Time For Goal Achievement 2 weeks  Wound Therapy - Potential for Goals Fair

## 2015-07-02 MED ORDER — LEVOFLOXACIN 750 MG PO TABS
750.0000 mg | ORAL_TABLET | ORAL | Status: DC
  Administered 2015-07-02: 750 mg via ORAL
  Filled 2015-07-02: qty 1

## 2015-07-02 NOTE — Progress Notes (Signed)
[facility] PROGRESS NOTE    Progress Note   SHERIE DOBROWOLSKI UJW:[id number] DOB: 09/28/1928 DOA: 06/29/2015 PCP: Lupe Carney, MD   Brief Narrative:   Jasmine Fernandez is an 81 y.o. female with history of hypertension and diabetes as well as aortic stenosis with found down at home she was brought to the ED and was found to be in acute renal failure with a white count 26 and a decubitus ulcer unable to obtain a history from patient. She lives alone mostly independent.  Assessment/Plan:  AKI (acute kidney injury) (HCC): Likely prerenal,  resolved with IV hydration. KVO IV fluids.  Fall: She doesn't remember she didn't she trip she is nonfocal on physical exam. PT pigmented skilled nursing facility. Patient agreed to go to rehabilitation close to [facility]. Awaiting placement.  Diabetes mellitus without complication (HCC) A single lumen A1c 6.3 she has been well regulated without insulin.  Unstageable left gluteus Pressure ulcer: Wound care was consulted who recommended hydrotherapy and enzymatic debridement. She has remained febrile no growth in cultures till date. We'll discuss with wound care whether she needs hydrotherapy as an outpatient.    Malnutrition of moderate degree: Nutrition consult.    Leukocytosis: She has remained afebrile throughout her hospital stay she was started on Vanc and Levaquin on admission, surgery does not think that her ulcer is a cause of her fever cultures have remained negative till date. We'll descalate her regimen to oral Levaquin which she will continue as an outpatient.  Acute encephalopathy: Resolved unclear etiology. Likely due to dehydration, TSH and B-12 with a normal. We'll have to be evaluated as an outpatient for cognitive impairment/dementia.  Elevated troponins: No chest pain EKG with LVH and T-wave inversions likely due to early repull suspect demand ischemia continue aspirin and beta blockers.    Weakness    DVT  Prophylaxis - Lovenox ordered.  Family Communication: none Disposition Plan: Home when stable. Code Status:     Code Status Orders        Start     Ordered   06/29/15 2219  Full code   Continuous     06/29/15 2218        IV Access:    Peripheral IV   Procedures and diagnostic studies:   No results found.   Medical Consultants:    None.  Anti-Infectives:   Anti-infectives    Start     Dose/Rate Route Frequency Ordered Stop   06/30/15 1600  vancomycin (VANCOCIN) IVPB 1000 mg/200 mL premix     1,000 mg 200 mL/hr over 60 Minutes Intravenous Every 24 hours 06/30/15 1418     06/30/15 1500  levofloxacin (LEVAQUIN) IVPB 750 mg     750 mg 100 mL/hr over 90 Minutes Intravenous Every 48 hours 06/30/15 1439        Subjective:    Jyl Heinz she has agreed to go to a rehabilitation center close to [employer].  Objective:    Filed Vitals:   07/01/15 0920 07/01/15 1300 07/01/15 2057 07/02/15 0509  BP: 148/60 129/84 165/58 164/49  Pulse: 76 85 71 67  Temp:  97.8 F (36.6 C) 98.4 F (36.9 C) 98.5 F (36.9 C)  TempSrc:  Axillary Oral Oral  Resp:  Height:      Weight:      SpO2:  99% 98% 97%    Intake/Output Summary (Last 24 hours) at 07/02/15 0945 Last data filed at 07/02/15 0803  Gross per  24 hour  Intake    600 ml  Output   1050 ml  Net   -450 ml   Filed Weights   06/29/15 1647  Weight: 68.04 kg (150 lb)    Exam: Gen:  NAD Cardiovascular:  RRR, No M/R/G Respiratory:  Good air movement and clear to auscultation Abdomen:  Abdomen soft, NT/ND, + BS Extremities:  No C/E/C   Data Reviewed:    Labs: Basic Metabolic Panel:  Recent Labs Lab 06/29/15 1722 06/30/15 0600 07/01/15 0548  NA 146* 148* 140  K 4.2 3.4* 3.7  CL 100* 112* 106  CO2 GLUCOSE 156* 180* 132*  BUN 85* 60* 36*  CREATININE 1.41* 1.05* 0.94  CALCIUM 10.4* 9.1 8.9  MG  --  2.5*  --    GFR Estimated Creatinine Clearance: 40.1 mL/min (by C-G  formula based on Cr of 0.94). Liver Function Tests:  Recent Labs Lab 06/29/15 1739 06/30/15 0600  AST 61* 41  ALT 32 23  ALKPHOS 91 70  BILITOT 1.1 1.1  PROT 8.0 5.9*  ALBUMIN 3.5 2.6*   No results for input(s): LIPASE, AMYLASE in the last 168 hours. No results for input(s): AMMONIA in the last 168 hours. Coagulation profile No results for input(s): INR, PROTIME in the last 168 hours.  CBC:  Recent Labs Lab 06/29/15 1722 06/30/15 0600 07/01/15 0548  WBC 26.7* 24.7* 19.7*  NEUTROABS 23.9* 22.1*  --   HGB 17.7* 13.9 12.4  HCT 52.3* 42.3 38.2  MCV 88.9 89.4 90.1  PLT 276 227 195   Cardiac Enzymes:  Recent Labs Lab 06/29/15 1739 06/30/15 1435 06/30/15 2022  CKTOTAL 919*  --   --   TROPONINI 0.11* 0.10* 0.09*   BNP (last 3 results) No results for input(s): PROBNP in the last 8760 hours. CBG: No results for input(s): GLUCAP in the last 168 hours. D-Dimer: No results for input(s): DDIMER in the last 72 hours. Hgb A1c:  Recent Labs  06/30/15 0600  HGBA1C 6.3*   Lipid Profile: No results for input(s): CHOL, HDL, LDLCALC, TRIG, CHOLHDL, LDLDIRECT in the last 72 hours. Thyroid function studies:  Recent Labs  06/30/15 1056  TSH 0.873   Anemia work up:  Recent Labs  06/30/15 1056  VITAMINB12 1449*   Sepsis Labs:  Recent Labs Lab 06/29/15 1722 06/29/15 1739 06/29/15 1744 06/29/15 2026 06/30/15 0600 07/01/15 0548  WBC 26.7*  --   --   --  24.7* 19.7*  LATICACIDVEN  --  2.2* 2.67* 1.7  --   --    Microbiology Recent Results (from the past 240 hour(s))  Urine culture     Status: None   Collection Time: 06/22/15  4:01 PM  Result Value Ref Range Status   Specimen Description URINE, RANDOM  Final   Special Requests NONE  Final   Culture   Final    MULTIPLE SPECIES PRESENT, SUGGEST RECOLLECTION Performed at [facility]    Report Status 06/24/2015 FINAL  Final  Blood culture (routine x 2)     Status: None (Preliminary result)    Collection Time: 06/29/15  5:25 PM  Result Value Ref Range Status   Specimen Description BLOOD LEFT ANTECUBITAL  Final   Special Requests BOTTLES DRAWN AEROBIC AND ANAEROBIC 5CC   Final   Culture   Final    NO GROWTH 2 DAYS Performed at [facility]    Report Status PENDING  Incomplete  Urine culture     Status:  None   Collection Time: 06/29/15  6:43 PM  Result Value Ref Range Status   Specimen Description URINE, CLEAN CATCH  Final   Special Requests NONE  Final   Culture   Final    NO GROWTH 2 DAYS [facility]    Report Status 07/01/2015 FINAL  Final  Blood culture (routine x 2)     Status: None (Preliminary result)   Collection Time: 06/29/15  7:05 PM  Result Value Ref Range Status   Specimen Description BLOOD RIGHT ANTECUBITAL  Final   Special Requests BOTTLES DRAWN AEROBIC AND ANAEROBIC 5CC   Final   Culture   Final    NO GROWTH 2 DAYS [facility]    Report Status PENDING  Incomplete     Medications:   . aspirin  300 mg Rectal Daily  . atenolol  50 mg Oral Daily  . collagenase   Topical Daily  . enoxaparin (LOVENOX) injection  40 mg Subcutaneous Q24H  . feeding supplement (ENSURE ENLIVE)  237 mL Oral BID BM  . levofloxacin (LEVAQUIN) IV  750 mg Intravenous Q48H  . potassium chloride  40 mEq Oral Daily  . saccharomyces boulardii  250 mg Oral BID  . vancomycin  1,000 mg Intravenous Q24H   Continuous Infusions: . sodium chloride 10 mL/hr at 07/01/15 1055    Time spent: 25 min   LOS: 3 days   Marinda ElkFELIZ ORTIZ, ABRAHAM  [facility] Pager [phone]  *Please refer to [email or web address], password [email or web address] to get updated schedule on who will round on this patient, as hospitalists switch teams weekly. If 7PM-7AM, please contact night-coverage at [email or web address], password [email or web address] for any overnight needs.  07/02/2015, 9:45 AM

## 2015-07-02 NOTE — Progress Notes (Signed)
   07/02/15 1300  Subjective Assessment  Subjective my foot hurts  Patient and Family Stated Goals none stated  Date of Onset 06/29/15  Prior Treatments dressed with foam adhesive dressing  Evaluation and Treatment  Evaluation and Treatment Procedures Explained to Patient/Family Yes Eschar softening, incr  Purulent exudate today  Evaluation and Treatment Procedures agreed to  Pressure Ulcer 07/01/15 Unstageable - Full thickness tissue loss in which the base of the ulcer is covered by slough (yellow, tan, gray, green or brown) and/or eschar (tan, brown or black) in the wound bed. PT--L glut/sacral  pressure wound   Date First Assessed/Time First Assessed: 07/01/15 1415   Location: Buttocks  Location Orientation: Left  Staging: Unstageable - Full thickness tissue loss in which the base of the ulcer is covered by slough (yellow, tan, gray, green or brown) and/or e...  Dressing Type Moist to dry;Other (Comment) (santyl)  Dressing Change Frequency Daily  State of Healing Early/partial granulation  Site / Wound Assessment Brown;Yellow  % Wound base Red or Granulating (see eval)  Peri-wound Assessment Intact  Drainage Amount None  Drainage Description Purulent;Odor  Treatment Hydrotherapy (Pulse lavage);Packing (Saline gauze);Other (Comment) (santyl)  Hydrotherapy  Pulsed lavage therapy - wound location L buttocks/sacrum  Pulsed Lavage with Suction (psi) 8 psi  Pulsed Lavage with Suction - Normal Saline Used 1000 mL  Pulsed Lavage Tip Tip with splash shield  Wound Therapy - Assess/Plan/Recommendations  Wound Therapy - Clinical Statement Pt with  unstageable pressure ulcer, covered in slough/eschar and will benefit from pulsed lavage and sharps debridement to facilitate wound healing  Wound Therapy - Functional Problem List generalized weakness, limited mobility  Factors Delaying/Impairing Wound Healing Immobility;Multiple medical problems  Hydrotherapy Plan Debridement;Dressing  change;Patient/family education;Pulsatile lavage with suction  Wound Therapy - Frequency 6X / week  Wound Therapy - Follow Up Recommendations Skilled nursing facility  Wound Plan see above  Wound Therapy Goals - Improve the function of patient's integumentary system by progressing the wound(s) through the phases of wound healing by:  Decrease Necrotic Tissue to 80  Decrease Necrotic Tissue - Progress Progressing toward goal  Increase Granulation Tissue to 20  Increase Granulation Tissue - Progress Progressing toward goal  Time For Goal Achievement 2 weeks  Wound Therapy - Potential for Goals Fair

## 2015-07-02 NOTE — Progress Notes (Signed)
OT Cancellation Note  Patient Details Name: Jasmine Fernandez MRN: [id number] DOB: [birth year]   Cancelled Treatment:    Reason Eval/Treat Not Completed: Other (comment).  Pt is fatiqued but reports that she feels much better.  Will check back tomorrow if schedule permits.    Jasmine Fernandez 07/02/2015, 3:49 PM  Jasmine OtterMaryellen Noble Fernandez, OTR/L [phone] 07/02/2015

## 2015-07-02 NOTE — Progress Notes (Signed)
CSW met with pt at bedside and provide bed offers. Patient stated she was interested in [facility]. CSW called [facility] to confirm bed offer. After further discussion, [facility] is double checking ability to provide hydrotherapy at this time.   Belia Heman, [place] Work  [employer] [phone]

## 2015-07-03 MED ORDER — LEVOFLOXACIN 750 MG PO TABS: 750.0000 mg | ORAL_TABLET | ORAL | Status: DC

## 2015-07-03 MED ORDER — COLLAGENASE 250 UNIT/GM EX OINT: TOPICAL_OINTMENT | Freq: Every day | CUTANEOUS | Status: DC

## 2015-07-03 MED ORDER — SACCHAROMYCES BOULARDII 250 MG PO CAPS: 250.0000 mg | ORAL_CAPSULE | Freq: Two times a day (BID) | ORAL | Status: DC

## 2015-07-03 NOTE — Progress Notes (Signed)
Per SLP eval, pt needing full liquid diet. CSW updated fl2, left message for accepting snf. Pt pending further information from wound care nurse regarding hydrotherapy.   Jasmine CoasterKristen Yamile Roedl, LCSW  Clinical Social Work  [facility] [phone]

## 2015-07-03 NOTE — Evaluation (Signed)
Clinical/Bedside Swallow Evaluation Patient Details  Name: Jasmine Fernandez MRN: [id number] Date of Birth: [birth year]  Today's Date: 07/03/2015 Time: SLP Start Time (ACUTE ONLY): 1007 SLP Stop Time (ACUTE ONLY): 1050 SLP Time Calculation (min) (ACUTE ONLY): 43 min  Past Medical History:  Past Medical History  Diagnosis Date  . Diabetes mellitus without complication (HCC)   . Hypertension   . Goiter   . Aortic stenosis   . Rheumatic fever    Past Surgical History:  Past Surgical History  Procedure Laterality Date  . Tonsillectomy      During Childhood   HPI:  81 yo female adm to [facility] after being found down at home.  Pt with ARF, has pressure ulcer that is unstagable, diagnosed with encephalopathy, leukocytosis and malnutrition.  Pt also with h/o goiter.  CXR negative 06/29/15.  Swallow evaluation ordered as pt orally holding intake and requiring medicine crushed with puree.     Assessment / Plan / Recommendation Clinical Impression  Pt presents with symptoms of oral and cervical esophageal dysphagia.  Oral holding noted across ALL consistencies  (Up to 60 seconds prior to given liquids to help clear)  Oral deficits much more pronounced with solids/puree than liquids.  She required liquids to orally transit applesauce and cracker.  Dentures not at hospital and pt reports no one can bring them to her- ? contribution to deficits.   She admits to dysphagia since admission  not present premorbidly, however pt describes dysphagia as difficulty with food sticking in throat.  She denies choking/coughing on intake.  NO indications of pharyngeal residuals or aspiration present.   As pt without deficits prior to admit per her statement, suspect she will recover to baseline with medical improvement and strength.  Pt admits to good tolerance  of liquids.    To improve efficiency of intake and airway protection, recommend full liquid diet with SLP follow up at [facility].  Encouraged pt to SELF FEED for  maximal airway protection with po due to her dysphagia and intake given extra time required for her to eat.  Also recommend family bring pt her dentures/partials to maximize least restrictive diet consumption.  Using teach back, educated pt to findings, recommendations, compensation strategies.  Will follow while pt in hospital, she is agreeable to this and follow up SLP.     Aspiration Risk  Mild    Diet Recommendation Thin (full liquids per pt request over purees)   Medication Administration: Crushed with puree Compensations: Small sips/bites;Slow rate;Check for pocketing    Other  Recommendations Oral Care Recommendations: Oral care before and after PO   Follow Up Recommendations    SNF   Frequency and Duration min 2x/week  1 week   Pertinent Vitals/Pain Afebrile,decreased      Swallow Study Prior Functional Status   given pt with malnutrition, ? Premorbid dysphagia that pt is not able to recall     General Date of Onset: 07/03/15 Other Pertinent Information: 81 yo female adm to [facility] after being found down at home.  Pt with ARF, has pressure ulcer that is unstagable, diagnosed with encephalopathy, leukocytosis and malnutrition.  Pt also with h/o goiter.  CXR negative 06/29/15.  Swallow evaluation ordered as pt orally holding intake and requiring medicine crushed with puree.   Type of Study: Bedside swallow evaluation Diet Prior to this Study: Dysphagia 3 (soft);Thin liquids Temperature Spikes Noted: No Respiratory Status: Room air History of Recent Intubation: No Behavior/Cognition: Alert;Cooperative;Distractible;Requires cueing;Doesn't follow directions Oral Cavity - Dentition:  Edentulous (pt reports her dentures are at home and noone can pick them up for her) Self-Feeding Abilities: Needs assist;Needs set up Patient Positioning: Upright in bed Baseline Vocal Quality: Normal;Low vocal intensity (per pt is normal) Volitional Cough: Weak Volitional Swallow: Unable to elicit     Oral/Motor/Sensory Function Overall Oral Motor/Sensory Function:  (generalized weakness)   Ice Chips Ice chips: Not tested   Thin Liquid Thin Liquid: Impaired Presentation: Cup;Straw Oral Phase Impairments: Other (comment);Reduced lingual movement/coordination;Impaired anterior to posterior transit Oral Phase Functional Implications: Oral holding Pharyngeal  Phase Impairments: Suspected delayed Swallow    Nectar Thick Nectar Thick Liquid: Impaired Presentation: Straw;Self Fed;Cup Oral Phase Impairments: Impaired anterior to posterior transit;Reduced lingual movement/coordination Oral phase functional implications: Oral holding Pharyngeal Phase Impairments: Suspected delayed Swallow Other Comments: pt wincing and pointing to proximal esophagus   Honey Thick Honey Thick Liquid: Not tested   Puree Puree: Impaired Presentation: Spoon Oral Phase Impairments: Reduced lingual movement/coordination;Impaired anterior to posterior transit Oral Phase Functional Implications: Prolonged oral transit;Oral holding Pharyngeal Phase Impairments: Suspected delayed Swallow Other Comments: pt requiring liquids to aid oral clearance of applesauce - even after Ensure was initially given  - no s/s of aspiration however    Solid   GO    Solid: Impaired Oral Phase Impairments: Reduced lingual movement/coordination;Impaired anterior to posterior transit;Impaired mastication Oral Phase Functional Implications: Oral holding Pharyngeal Phase Impairments: Suspected delayed Swallow Other Comments: pt requiring applesauce and then water to facilitate oral clearance        [name], [name], MS [facility] SLP [phone]

## 2015-07-03 NOTE — Progress Notes (Signed)
Patient discharged and [facility] here to transport [facility].  Patient alert and oriented X4.  Son at bedside and taking flowers.  Patient leaving with her personal belongings.  No Complaints.  Room air.  Denies pain.  Respirations even and unlabored.

## 2015-07-03 NOTE — Progress Notes (Signed)
  Subjective: No acute changes  Objective: Vital signs in last 24 hours: Temp:  [98.2 F (36.8 C)-98.6 F (37 C)] 98.2 F (36.8 C) (10/14 1000) Pulse Rate:  [72-77] 72 (10/14 1000) Resp:  [16-18] 16 (10/14 1000) BP: (120-168)/(44-62) 120/62 mmHg (10/14 1000) SpO2:  [97 %-100 %] 99 % (10/14 1000) Last BM Date:  (Pt does not know) 720 PO yesterday BM x 2 Afebrile, VSS WBC still up on 07/01/15 Intake/Output from previous day: 10/13 0701 - 10/14 0700 In: 720 [P.O.:720] Out: 350 [Urine:350] Intake/Output this shift: Total I/O In: 240 [P.O.:240] Out: -    Left medial buttocks She also has a site right back that is breaking down.  Lab Results:   Recent Labs  07/01/15 0548  WBC 19.7*  HGB 12.4  HCT 38.2  PLT 195    BMET  Recent Labs  07/01/15 0548  NA 140  K 3.7  CL 106  CO2 28  GLUCOSE 132*  BUN 36*  CREATININE 0.94  CALCIUM 8.9   PT/INR No results for input(s): LABPROT, INR in the last 72 hours.   Recent Labs Lab 06/29/15 1739 06/30/15 0600  AST 61* 41  ALT 32 23  ALKPHOS 91 70  BILITOT 1.1 1.1  PROT 8.0 5.9*  ALBUMIN 3.5 2.6*     Lipase     Component Value Date/Time   LIPASE 47 08/02/2012 0115     Studies/Results: No results found.  Medications: . aspirin  300 mg Rectal Daily  . atenolol  50 mg Oral Daily  . collagenase   Topical Daily  . enoxaparin (LOVENOX) injection  40 mg Subcutaneous Q24H  . feeding supplement (ENSURE ENLIVE)  237 mL Oral BID BM  . levofloxacin  750 mg Oral Q48H  . potassium chloride  40 mEq Oral Daily  . saccharomyces boulardii  250 mg Oral BID    Assessment/Plan Early stage decubitus ulcer left medial buttocks Acute kidney injury Malnutrition/deconditioning Acute encephalopathy Elevated troponin Antibiotics:  Levofloacin - both blood and urine cultures are negative. DVT:  Lovenox/SCD   Plan:  There is nothing here to surgically debride.  Dead eschar is getting softer and slowly improving with  hydrotherapy.  I would recommend ongoing hydrotherapy daily here and in the next venue.  She would benefit from an air mattress also if that is something she would allow.    LOS: 4 days    Jenesis Suchy 07/03/2015

## 2015-07-03 NOTE — Progress Notes (Signed)
[facility] awaiting [facility].  Son coming to hospital to pick up flowers.  Social worker confirmed transportation has been called for patient pickup and [facility].

## 2015-07-03 NOTE — Progress Notes (Signed)
CSW continuing to follow.   CSW received notification from pt son, Oswald HillockRobert Boeding that after further review of options, pt family wanted [facility] to [facility]. [facility] willing to [facility] pt and willing to accept Letter of Guarantee (LOG) if [facility] authorization not received today. [facility] initiated insurance authorization request through [facility].  CSW received notification from [facility] that facility received insurance authorization from [facility] and pt can admit to facility today.   CSW updated pt at bedside and pt agreeable to plan for [facility].   CSW facilitated pt discharge needs including contacting facility, faxing pt discharge information via TLC, discussing with pt at bedside and pt son, Molly MaduroRobert via telephone, and arranging ambulance transport for [facility].  No further social work needs identified at this time.  CSW signing off.   Loletta SpecterSuzanna Andromeda Poppen, MSW, LCSW Clinical Social Work Coverage [phone]

## 2015-07-03 NOTE — Progress Notes (Signed)
Called PTAR [phone] Option 3 to inquire about patient transfer.  I was told she is first on the list for pickup and transfer.

## 2015-07-03 NOTE — Clinical Social Work Placement (Signed)
   CLINICAL SOCIAL WORK PLACEMENT  NOTE  Date:  07/03/2015  Patient Details  Name: Jasmine Fernandez MRN: [id number] Date of Birth: [birth year]  Clinical Social Work is seeking post-discharge placement for this patient at the Skilled  Nursing Facility level of care (*CSW will initial, date and re-position this form in  chart as items are completed):  Yes   Patient/family provided with [place] Clinical Social Work Department's list of facilities offering this level of care within the geographic area requested by the patient (or if unable, by the patient's family).  Yes   Patient/family informed of their freedom to choose among providers that offer the needed level of care, that participate in Medicare, Medicaid or managed care program needed by the patient, have an available bed and are willing to accept the patient.  Yes   Patient/family informed of [facility]'s ownership interest in [facility] and [facility], as well as of the fact that they are under no obligation to receive care at these facilities.  PASRR submitted to EDS on 06/30/15     PASRR number received on       Existing PASRR number confirmed on       FL2 transmitted to all facilities in geographic area requested by pt/family on 06/30/15     FL2 transmitted to all facilities within larger geographic area on       Patient informed that his/her managed care company has contracts with or will negotiate with certain facilities, including the following:        Yes   Patient/family informed of bed offers received.  [facility]     Physician recommends and patient chooses bed at      [facility].  Patient to be transferred to facility by ambulance Sharin Mons(PTAR)     Patient family notified on 07/03/15 of transfer.  Name of family member notified:  pt at bedside and pt son, Oswald HillockRobert Rietz via telephone     PHYSICIAN       Additional Comment:     _______________________________________________ Orson EvaKIDD, Raeshawn Tafolla A, LCSW 07/03/2015, 3:37 PM

## 2015-07-03 NOTE — Clinical Social Work Placement (Signed)
   CLINICAL SOCIAL WORK PLACEMENT  NOTE  Date:  07/03/2015  Patient Details  Name: Jasmine Fernandez MRN: [id number] Date of Birth: 29-Mar-1929  Clinical Social Work is seeking post-discharge placement for this patient at the Skilled  Nursing Facility level of care (*CSW will initial, date and re-position this form in  chart as items are completed):  Yes   Patient/family provided with [place] Clinical Social Work Department's list of facilities offering this level of care within the geographic area requested by the patient (or if unable, by the patient's family).  Yes   Patient/family informed of their freedom to choose among providers that offer the needed level of care, that participate in Medicare, Medicaid or managed care program needed by the patient, have an available bed and are willing to accept the patient.  Yes   Patient/family informed of [facility]'s ownership interest in [facility] and [facility], as well as of the fact that they are under no obligation to receive care at these facilities.  PASRR submitted to EDS on 06/30/15     PASRR number received on       Existing PASRR number confirmed on       FL2 transmitted to all facilities in geographic area requested by pt/family on 06/30/15     FL2 transmitted to all facilities within larger geographic area on       Patient informed that his/her managed care company has contracts with or will negotiate with certain facilities, including the following:        Yes   Patient/family informed of bed offers received.  Patient chooses bed at  [facility]     Physician recommends and patient chooses bed at      Patient to be transferred to   on 07/03/15.  Patient to be transferred to facility by       Patient family notified on 07/03/15 of transfer.  Name of family member notified:  Oswald Hillockobert Diamant     PHYSICIAN       Additional Comment:    _______________________________________________ Duke SalviaEED, Amare Bail  A, LCSW 07/03/2015, 1:11 PM
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
## 2015-07-03 NOTE — Progress Notes (Signed)
Pt gave CSW permission to contact pt son, as she had not heard back from pt daughter. CSW spoke with pt son, Jasmine HillockRobert Fernandez who states that he is the primary contact even though pt had asked csw to speak with pt sister. CSW updated patient son on disposition and discharge plan of [facility]. Pt brother stated that he would work with [facility] to complete admission paperwork this afternoon. Pt [employer] medic[employer] authorization pending. CSW left message with admission to discuss options while waiting on [employer] authorization. CSW received a call back from pt son, who states that patient sister and brother discussed options and were unhappy with the choice of [facility]. Pt family may be interested in [facility] but may not have that option either. Patient family calling csw back regarding decision. CSW also waiting on return call from [facility] regarding insurance authorization and expected wait time.   [name], LCSW  Clinical Social Work  [facility] [phone]

## 2015-07-03 NOTE — Progress Notes (Signed)
Occupational Therapy Treatment Patient Details Name: Jasmine Fernandez MRN: [id number] DOB: Apr 17, 1929 Today's Date: 07/03/2015    History of present illness pt was admitted 06/29/15 for weakness. She was found on floor by family members   OT comments  Pt is making slow progress but she is very willing to participate.  Difficulty swallowing eggs:  Talked to slp and they recommend extra gravy for items  Follow Up Recommendations  SNF    Equipment Recommendations   (none at this time:  unable to safely transfer)    Recommendations for Other Services  (possible SLP if pt continues to have diff swallowing)    Precautions / Restrictions Precautions Precautions: Fall Restrictions Weight Bearing Restrictions: No       Mobility Bed Mobility     Rolling: Max assist Sidelying to sit: Max assist   Sit to supine: Max assist   General bed mobility comments: pt initiates but very weak.  Extra time required.  Pt on air mattress--this assists with mobility  Transfers                      Balance   Sitting-balance support: Feet supported;No upper extremity supported Sitting balance-Leahy Scale: Poor Sitting balance - Comments: min guard for safety.  initially leaned to L--able to come to midline with assist and cues.  Tends to keep head flexed                           ADL Overall ADL's : Needs assistance/impaired Eating/Feeding: Sitting;Minimal assistance   Grooming: Wash/dry hands;Wash/dry face;Moderate assistance;Sitting                                 General ADL Comments: sat at [facility] for breakfast.  Held cup initially then leaned forward to drink from straw. Pt does not have upper dentures and lower plate.  She was unable to swallow egss and spit them out. She was able to swallow cheerios with extra time.  Spoke to SLP--encouraged pt/RN to order foods she thinks she can eat with extra gravy.  Pt does not feel family members can bring  dentures.      Vision                     Perception     Praxis      Cognition   Behavior During Therapy: Flat affect Overall Cognitive Status: No family/caregiver present to determine baseline cognitive functioning                  General Comments: improved initiation and following commands    Extremity/Trunk Assessment               Exercises     Shoulder Instructions       General Comments      Pertinent Vitals/ Pain       Pain Assessment: Faces Faces Pain Scale: Hurts little more Pain Location: L foot Pain Descriptors / Indicators: Grimacing;Sore Pain Intervention(s): Limited activity within patient's tolerance;Monitored during session;Repositioned (reapplied PRAFO)  Home Living                                          Prior Functioning/Environment  Frequency Min 2X/week     Progress Toward Goals  OT Goals(current goals can now be found in the care plan section)  Progress towards OT goals: Progressing toward goals (slowly)     Plan      Co-evaluation                 End of Session     Activity Tolerance Patient tolerated treatment well   Patient Left in bed;with call bell/phone within reach;with bed alarm set;with nursing/sitter in room   Nurse Communication  (extra gravy for meals)        Time: [id number] OT Time Calculation (min): 37 min  Charges: OT General Charges $OT Visit: 1 Procedure OT Treatments $Self Care/Home Management : 8-22 mins $Therapeutic Activity: 8-22 mins  Donnalynn Wheeless 07/03/2015, 9:09 AM Marica OtterMaryellen Stayce Delancy, OTR/L [phone] 07/03/2015

## 2015-07-03 NOTE — Progress Notes (Signed)
Report given to Clarisa SchoolsAalfa, RN at [facility].  [phone].

## 2015-07-03 NOTE — Progress Notes (Signed)
Physical Therapy Wound Treatment Patient Details  Name: Jasmine Fernandez MRN: [id number] Date of Birth: 11/22/1928  Today's Date: 07/03/2015 Time:  -     Subjective  Subjective: I feel pretty good, but would like to have a tylenol prior to this.  Patient and Family Stated Goals: none stated Date of Onset: 06/29/15 Prior Treatments: dressed with foam adhesive dressing  Pain Score: Pain Score: 0-No pain  Wound Assessment  Pressure Ulcer 06/29/15 Deep Tissue Injury - Purple or maroon localized area of discolored intact skin or blood-filled blister due to damage of underlying soft tissue from pressure and/or shear. RIGHT LATERAL HEEL (Active)  Dressing Type Foam 07/02/2015  9:32 PM  Dressing Intact 07/03/2015  8:34 AM  Dressing Change Frequency PRN 07/02/2015  9:32 PM  Site / Wound Assessment Dressing in place / Unable to assess 07/02/2015  9:32 PM  % Wound base Red or Granulating 0% 06/30/2015 10:33 AM  % Wound base Yellow 0% 06/30/2015 10:33 AM  % Wound base Black 0% 06/30/2015 10:33 AM  % Wound base Other (Comment) 100% 06/30/2015 10:33 AM  Peri-wound Assessment Intact 06/30/2015 10:33 AM  Wound Length (cm) 3 cm 06/29/2015 10:00 PM  Wound Width (cm) 2 cm 06/29/2015 10:00 PM  Wound Depth (cm) 0 cm 06/29/2015 10:00 PM  Tunneling (cm) 0 06/29/2015 10:00 PM  Undermining (cm) 0 06/29/2015 10:00 PM  Margins Attached edges (approximated) 06/30/2015 10:33 AM  Drainage Amount None 07/01/2015  8:45 PM  Treatment Other (Comment) 06/29/2015 10:00 PM     Pressure Ulcer 06/29/15 Unstageable - Full thickness tissue loss in which the base of the ulcer is covered by slough (yellow, tan, gray, green or brown) and/or eschar (tan, brown or black) in the wound bed. LEFT BUTTOCK (Active)  Dressing Type Foam;Other (Comment) 07/02/2015  9:32 PM  Dressing Changed 07/03/2015  8:34 AM  Dressing Change Frequency Daily 07/02/2015  9:32 PM  State of Healing Eschar 07/01/2015  8:45 PM  Site / Wound  Assessment Dressing in place / Unable to assess 07/02/2015  9:32 PM  % Wound base Red or Granulating 0% 06/30/2015 10:33 AM  % Wound base Yellow 10% 06/30/2015 10:33 AM  % Wound base Black 70% 06/30/2015 10:33 AM  % Wound base Other (Comment) 20% 06/30/2015 10:33 AM  Peri-wound Assessment Intact;Pink 06/30/2015 10:33 AM  Wound Length (cm) 8 cm 06/29/2015 10:00 PM  Wound Width (cm) 6 cm 06/29/2015 10:00 PM  Wound Depth (cm) 0 cm 06/29/2015 10:00 PM  Tunneling (cm) 0 06/29/2015 10:00 PM  Undermining (cm) 0 06/29/2015 10:00 PM  Margins Attached edges (approximated) 06/30/2015 10:33 AM  Drainage Amount None 07/01/2015  8:45 PM  Treatment Cleansed 06/29/2015 10:00 PM     Pressure Ulcer 06/29/15 left hip skin tear (Active)  Dressing Type Foam 07/01/2015  9:20 AM  Dressing Clean;Dry;Intact 07/01/2015  9:20 AM  Dressing Change Frequency PRN 07/01/2015  9:20 AM  Site / Wound Assessment Dressing in place / Unable to assess 07/01/2015  9:20 AM     Pressure Ulcer 07/01/15 Unstageable - Full thickness tissue loss in which the base of the ulcer is covered by slough (yellow, tan, gray, green or brown) and/or eschar (tan, brown or black) in the wound bed. PT--L glut/sacral  pressure wound  (Active)  Dressing Type Moist to dry;Other (Comment) 07/03/2015  4:00 PM  Dressing Changed 07/03/2015  4:00 PM  Dressing Change Frequency Daily 07/03/2015  4:00 PM  State of Healing Early/partial granulation 07/03/2015  4:00 PM  Site /  Wound Assessment Brown;Yellow 07/03/2015  4:00 PM  Peri-wound Assessment Intact 07/03/2015  4:00 PM  Wound Length (cm) 10.1 cm 07/01/2015  2:00 PM  Wound Width (cm) 6.2 cm 07/01/2015  2:00 PM  Drainage Amount None 07/03/2015  4:00 PM  Drainage Description Purulent;Odor 07/03/2015  4:00 PM  Treatment Debridement (Selective);Hydrotherapy (Pulse lavage) 07/03/2015  4:00 PM     Wound / Incision (Open or Dehisced) 06/29/15 Other (Comment) Back Left;Upper skin tear with pink base no  bleeding or drainage  (Active)  Dressing Type Foam 07/01/2015  9:20 AM  Dressing Changed New 06/30/2015  9:54 PM  Dressing Status Clean;Dry;Intact 07/01/2015  9:20 AM  Dressing Change Frequency PRN 07/01/2015  9:20 AM  Site / Wound Assessment Dressing in place / Unable to assess 07/01/2015  9:20 AM  % Wound base Red or Granulating 100% 06/30/2015 10:33 AM  % Wound base Yellow 0% 06/30/2015 10:33 AM  % Wound base Black 0% 06/30/2015 10:33 AM  % Wound base Other (Comment) 0% 06/30/2015 10:33 AM  Peri-wound Assessment Intact 06/30/2015 10:33 AM  Wound Length (cm) 5 cm 06/29/2015 10:00 PM  Wound Width (cm) 7 cm 06/29/2015 10:00 PM  Wound Depth (cm) 0 cm 06/29/2015 10:00 PM  Tunneling (cm) 0 06/29/2015 10:00 PM  Undermining (cm) 0 06/29/2015 10:00 PM  Margins Attached edges (approximated) 06/30/2015 10:33 AM  Closure None 06/30/2015 10:33 AM  Drainage Amount None 06/30/2015 10:33 AM   Hydrotherapy Pulsed lavage therapy - wound location: L buttocks/sacrum Pulsed Lavage with Suction (psi): 12 psi Pulsed Lavage with Suction - Normal Saline Used: 1000 mL Pulsed Lavage Tip: Tip with splash shield Selective Debridement Selective Debridement - Tools Used: Forceps;Scissors Selective Debridement - Tissue Removed: necrotic, brown    Wound Assessment and Plan  Wound Therapy - Assess/Plan/Recommendations Wound Therapy - Clinical Statement: Pt with  unstageable pressure ulcer, covered in slough/eschar and will benefit from pulsed lavage and sharps debridement to facilitate wound healing Wound Therapy - Functional Problem List: generalized weakness, limited mobility Factors Delaying/Impairing Wound Healing: Immobility;Multiple medical problems Hydrotherapy Plan: Debridement;Dressing change;Patient/family education;Pulsatile lavage with suction Wound Therapy - Frequency: 6X / week Wound Therapy - Follow Up Recommendations: Skilled nursing facility Wound Plan: see above  Wound Therapy  Goals- Improve the function of patient's integumentary system by progressing the wound(s) through the phases of wound healing (inflammation - proliferation - remodeling) by: Decrease Necrotic Tissue to: 80 Decrease Necrotic Tissue - Progress: Progressing toward goal Increase Granulation Tissue to: 20 Increase Granulation Tissue - Progress: Progressing toward goal Time For Goal Achievement: 2 weeks Wound Therapy - Potential for Goals: Fair  Goals will be updated until maximal potential achieved or discharge criteria met.  Discharge criteria: when goals achieved, discharge from hospital, MD decision/surgical intervention, no progress towards goals, refusal/missing three consecutive treatments without notification or medical reason.  Pt to benefit from continued Hydrotherapy and wound care at next venue.      Clide Dales 07/03/2015, 4:15 PM  Clide Dales, PT Pager: [phone] 07/03/2015

## 2015-07-04 LAB — CULTURE, BLOOD (ROUTINE X 2)
CULTURE: NO GROWTH
CULTURE: NO GROWTH

## 2015-07-07 ENCOUNTER — Non-Acute Institutional Stay (SKILLED_NURSING_FACILITY): Payer: Medicare PPO | Admitting: Internal Medicine

## 2015-07-07 DIAGNOSIS — R29898 Other symptoms and signs involving the musculoskeletal system: Secondary | ICD-10-CM

## 2015-07-07 DIAGNOSIS — W19XXXA Unspecified fall, initial encounter: Secondary | ICD-10-CM | POA: Diagnosis not present

## 2015-07-07 DIAGNOSIS — L89620 Pressure ulcer of left heel, unstageable: Secondary | ICD-10-CM | POA: Diagnosis not present

## 2015-07-07 DIAGNOSIS — E860 Dehydration: Secondary | ICD-10-CM | POA: Diagnosis not present

## 2015-07-07 DIAGNOSIS — L89310 Pressure ulcer of right buttock, unstageable: Secondary | ICD-10-CM | POA: Diagnosis not present

## 2015-07-07 DIAGNOSIS — D72829 Elevated white blood cell count, unspecified: Secondary | ICD-10-CM | POA: Diagnosis not present

## 2015-07-07 NOTE — Progress Notes (Signed)
Patient ID: Jasmine Fernandez, female   DOB: 1929/08/24, 81 y.o.   MRN: [id number]     Facility; [facility] Chief complaint; admission to SNF post admit to [facility] from 10/10 to 07/03/15  History; this is a pleasant 81 year old woman who apparently lives in her own home in [place]. She claims to be independent with ADLs and IADLs. She was found on the floor in her own home. It is not really clear of any ancillary medical information about how she got there. She does not have a history of falls or syncope. She is really uncertain about its and even states she wasn't sure how she got there. She remembers struggling tried to get up but could not. She thinks she was found I the police when obviously somebody became concerned that she would not answer the phone or her door. In the hospital she was found to have dehydration with hypernatremia this was corrected. She also had a increasing white count. Surgery was consulted about the right gluteal pressure ulcer but did not feel she needed debridement. She was given empiric antibiotics in the hospital her white count seemed to come down and she was discharged on Levaquin although her white count was still quite elevated when she left the hospital [see below]  The patient does not give a history of falling or syncope she did not use an ambulatory assist device. She did live alone however as far as the patient is concerned she was doing quite well. She does relate a 20 pound weight loss over the last 6 months. States she had difficulty swallowing with some degree of dysphagia. Other than that there does not seem to be any obvious medical issue recently as far as the patient is concerned. Her serum albumin was depressed at 2.6 during the hospitalization, 3.5 on presentation. Her urine and blood cultures were negative   Recent Results (from the past 2160 hour(s))  Urinalysis, Routine w reflex microscopic     Status: Abnormal   Collection Time: 06/22/15   4:01 PM  Result Value Ref Range   Color, Urine YELLOW YELLOW   APPearance CLOUDY (A) CLEAR   Specific Gravity, Urine 1.012 1.005 - 1.030   pH 7.0 5.0 - 8.0   Glucose, UA NEGATIVE NEGATIVE mg/dL   Hgb urine dipstick NEGATIVE NEGATIVE   Bilirubin Urine NEGATIVE NEGATIVE   Ketones, ur NEGATIVE NEGATIVE mg/dL   Protein, ur 30 (A) NEGATIVE mg/dL   Urobilinogen, UA 1.0 0.0 - 1.0 mg/dL   Nitrite NEGATIVE NEGATIVE   Leukocytes, UA SMALL (A) NEGATIVE  Urine microscopic-add on     Status: Abnormal   Collection Time: 06/22/15  4:01 PM  Result Value Ref Range   Squamous Epithelial / LPF MANY (A) RARE   WBC, UA 11-20 <3 WBC/hpf   RBC / HPF 0-2 <3 RBC/hpf   Bacteria, UA FEW (A) RARE  Urine culture     Status: None   Collection Time: 06/22/15  4:01 PM  Result Value Ref Range   Specimen Description URINE, RANDOM    Special Requests NONE    Culture      MULTIPLE SPECIES PRESENT, SUGGEST RECOLLECTION Performed at [facility]    Report Status 06/24/2015 FINAL   Comprehensive metabolic panel     Status: Abnormal   Collection Time: 06/22/15  4:10 PM  Result Value Ref Range   Sodium 130 (L) 135 - 145 mmol/L   Potassium 4.0 3.5 - 5.1 mmol/L   Chloride 96 (L)  101 - 111 mmol/L   CO2 27 22 - 32 mmol/L   Glucose, Bld 108 (H) 65 - 99 mg/dL   BUN 12 6 - 20 mg/dL   Creatinine, Ser 0.79 0.44 - 1.00 mg/dL   Calcium 9.8 8.9 - 10.3 mg/dL   Total Protein 7.0 6.5 - 8.1 g/dL   Albumin 3.6 3.5 - 5.0 g/dL   AST 22 15 - 41 U/L   ALT 12 (L) 14 - 54 U/L   Alkaline Phosphatase 74 38 - 126 U/L   Total Bilirubin 0.7 0.3 - 1.2 mg/dL   GFR calc non Af Amer >60 >60 mL/min   GFR calc Af Amer >60 >60 mL/min    Comment: (NOTE) The eGFR has been calculated using the CKD EPI equation. This calculation has not been validated in all clinical situations. eGFR's persistently <60 mL/min signify possible Chronic Kidney Disease.    Anion gap 7 5 - 15  CBC with Differential     Status: Abnormal   Collection  Time: 06/22/15  4:10 PM  Result Value Ref Range   WBC 11.7 (H) 4.0 - 10.5 K/uL   RBC 4.80 3.87 - 5.11 MIL/uL   Hemoglobin 14.1 12.0 - 15.0 g/dL   HCT 40.8 36.0 - 46.0 %   MCV 85.0 78.0 - 100.0 fL   MCH 29.4 26.0 - 34.0 pg   MCHC 34.6 30.0 - 36.0 g/dL   RDW 12.2 11.5 - 15.5 %   Platelets 325 150 - 400 K/uL   Neutrophils Relative % 77 %   Neutro Abs 9.1 (H) 1.7 - 7.7 K/uL   Lymphocytes Relative 13 %   Lymphs Abs 1.5 0.7 - 4.0 K/uL   Monocytes Relative 8 %   Monocytes Absolute 0.9 0.1 - 1.0 K/uL   Eosinophils Relative 2 %   Eosinophils Absolute 0.2 0.0 - 0.7 K/uL   Basophils Relative 0 %   Basophils Absolute 0.0 0.0 - 0.1 K/uL  I-stat troponin, ED     Status: None   Collection Time: 06/22/15  4:16 PM  Result Value Ref Range   Troponin i, poc 0.05 0.00 - 0.08 ng/mL   Comment 3            Comment: Due to the release kinetics of cTnI, a negative result within the first hours of the onset of symptoms does not rule out myocardial infarction with certainty. If myocardial infarction is still suspected, repeat the test at appropriate intervals.   CBC with Differential/Platelet     Status: Abnormal   Collection Time: 06/23/15  4:42 PM  Result Value Ref Range   WBC 14.7 (H) 4.0 - 10.5 K/uL   RBC 5.02 3.87 - 5.11 MIL/uL   Hemoglobin 14.9 12.0 - 15.0 g/dL   HCT 43.1 36.0 - 46.0 %   MCV 85.9 78.0 - 100.0 fL   MCH 29.7 26.0 - 34.0 pg   MCHC 34.6 30.0 - 36.0 g/dL   RDW 12.3 11.5 - 15.5 %   Platelets 281 150 - 400 K/uL   Neutrophils Relative % 81 %   Neutro Abs 11.8 (H) 1.7 - 7.7 K/uL   Lymphocytes Relative 11 %   Lymphs Abs 1.6 0.7 - 4.0 K/uL   Monocytes Relative 6 %   Monocytes Absolute 0.9 0.1 - 1.0 K/uL   Eosinophils Relative 2 %   Eosinophils Absolute 0.4 0.0 - 0.7 K/uL   Basophils Relative 0 %   Basophils Absolute 0.0 0.0 - 0.1 K/uL  Basic metabolic  panel     Status: Abnormal   Collection Time: 06/23/15  4:42 PM  Result Value Ref Range   Sodium 130 (L) 135 - 145 mmol/L     Comment: REPEATED TO VERIFY   Potassium 4.9 3.5 - 5.1 mmol/L    Comment: DELTA CHECK NOTED REPEATED TO VERIFY SLIGHT HEMOLYSIS    Chloride 97 (L) 101 - 111 mmol/L    Comment: REPEATED TO VERIFY   CO2 26 22 - 32 mmol/L    Comment: REPEATED TO VERIFY   Glucose, Bld 106 (H) 65 - 99 mg/dL   BUN 14 6 - 20 mg/dL   Creatinine, Ser 0.86 0.44 - 1.00 mg/dL   Calcium 9.7 8.9 - 10.3 mg/dL    Comment: REPEATED TO VERIFY   GFR calc non Af Amer >60 >60 mL/min   GFR calc Af Amer >60 >60 mL/min    Comment: (NOTE) The eGFR has been calculated using the CKD EPI equation. This calculation has not been validated in all clinical situations. eGFR's persistently <60 mL/min signify possible Chronic Kidney Disease.    Anion gap 7 5 - 15    Comment: REPEATED TO VERIFY  Urinalysis, Routine w reflex microscopic (not at [facility])     Status: Abnormal   Collection Time: 06/23/15  5:17 PM  Result Value Ref Range   Color, Urine AMBER (A) YELLOW    Comment: BIOCHEMICALS MAY BE AFFECTED BY COLOR   APPearance CLEAR CLEAR   Specific Gravity, Urine 1.017 1.005 - 1.030   pH 6.5 5.0 - 8.0   Glucose, UA NEGATIVE NEGATIVE mg/dL   Hgb urine dipstick NEGATIVE NEGATIVE   Bilirubin Urine NEGATIVE NEGATIVE   Ketones, ur NEGATIVE NEGATIVE mg/dL   Protein, ur 30 (A) NEGATIVE mg/dL   Urobilinogen, UA 1.0 0.0 - 1.0 mg/dL   Nitrite NEGATIVE NEGATIVE   Leukocytes, UA NEGATIVE NEGATIVE  Urine microscopic-add on     Status: Abnormal   Collection Time: 06/23/15  5:17 PM  Result Value Ref Range   Squamous Epithelial / LPF FEW (A) RARE   Urine-Other MUCOUS PRESENT   Basic metabolic panel     Status: Abnormal   Collection Time: 06/29/15  5:22 PM  Result Value Ref Range   Sodium 146 (H) 135 - 145 mmol/L   Potassium 4.2 3.5 - 5.1 mmol/L   Chloride 100 (L) 101 - 111 mmol/L   CO2 31 22 - 32 mmol/L   Glucose, Bld 156 (H) 65 - 99 mg/dL   BUN 85 (H) 6 - 20 mg/dL   Creatinine, Ser 1.41 (H) 0.44 - 1.00 mg/dL   Calcium 10.4 (H) 8.9  - 10.3 mg/dL   GFR calc non Af Amer 33 (L) >60 mL/min   GFR calc Af Amer 38 (L) >60 mL/min    Comment: (NOTE) The eGFR has been calculated using the CKD EPI equation. This calculation has not been validated in all clinical situations. eGFR's persistently <60 mL/min signify possible Chronic Kidney Disease.    Anion gap 15 5 - 15  CBC     Status: Abnormal   Collection Time: 06/29/15  5:22 PM  Result Value Ref Range   WBC 26.7 (H) 4.0 - 10.5 K/uL   RBC 5.88 (H) 3.87 - 5.11 MIL/uL   Hemoglobin 17.7 (H) 12.0 - 15.0 g/dL   HCT 52.3 (H) 36.0 - 46.0 %   MCV 88.9 78.0 - 100.0 fL   MCH 30.1 26.0 - 34.0 pg   MCHC 33.8 30.0 - 36.0 g/dL  RDW 12.8 11.5 - 15.5 %   Platelets 276 150 - 400 K/uL  Differential     Status: Abnormal   Collection Time: 06/29/15  5:22 PM  Result Value Ref Range   Neutrophils Relative % 89 %   Neutro Abs 23.9 (H) 1.7 - 7.7 K/uL   Lymphocytes Relative 3 %   Lymphs Abs 0.8 0.7 - 4.0 K/uL   Monocytes Relative 8 %   Monocytes Absolute 2.1 (H) 0.1 - 1.0 K/uL   Eosinophils Relative 0 %   Eosinophils Absolute 0.0 0.0 - 0.7 K/uL   Basophils Relative 0 %   Basophils Absolute 0.0 0.0 - 0.1 K/uL  Blood culture (routine x 2)     Status: None   Collection Time: 06/29/15  5:25 PM  Result Value Ref Range   Specimen Description BLOOD LEFT ANTECUBITAL    Special Requests BOTTLES DRAWN AEROBIC AND ANAEROBIC 5CC     Culture      NO GROWTH 5 DAYS Performed at [facility]    Report Status 07/04/2015 FINAL   Hepatic function panel     Status: Abnormal   Collection Time: 06/29/15  5:39 PM  Result Value Ref Range   Total Protein 8.0 6.5 - 8.1 g/dL   Albumin 3.5 3.5 - 5.0 g/dL   AST 61 (H) 15 - 41 U/L   ALT 32 14 - 54 U/L   Alkaline Phosphatase 91 38 - 126 U/L   Total Bilirubin 1.1 0.3 - 1.2 mg/dL   Bilirubin, Direct 0.2 0.1 - 0.5 mg/dL   Indirect Bilirubin 0.9 0.3 - 0.9 mg/dL  Lactic acid, plasma     Status: Abnormal   Collection Time: 06/29/15  5:39 PM  Result  Value Ref Range   Lactic Acid, Venous 2.2 (HH) 0.5 - 2.0 mmol/L    Comment: CRITICAL RESULT CALLED TO, READ BACK BY AND VERIFIED WITH: DAVIS N RN AT 1818 ON 10.10.16 BY MENDOZA B    Troponin I     Status: Abnormal   Collection Time: 06/29/15  5:39 PM  Result Value Ref Range   Troponin I 0.11 (H) <0.031 ng/mL    Comment:        PERSISTENTLY INCREASED TROPONIN VALUES IN THE RANGE OF 0.04-0.49 ng/mL CAN BE SEEN IN:       -UNSTABLE ANGINA       -CONGESTIVE HEART FAILURE       -MYOCARDITIS       -CHEST TRAUMA       -ARRYHTHMIAS       -LATE PRESENTING MYOCARDIAL INFARCTION       -COPD   CLINICAL FOLLOW-UP RECOMMENDED.   CK     Status: Abnormal   Collection Time: 06/29/15  5:39 PM  Result Value Ref Range   Total CK 919 (H) 38 - 234 U/L  I-Stat CG4 Lactic Acid, ED     Status: Abnormal   Collection Time: 06/29/15  5:44 PM  Result Value Ref Range   Lactic Acid, Venous 2.67 (HH) 0.5 - 2.0 mmol/L   Comment NOTIFIED PHYSICIAN   Urinalysis, Routine w reflex microscopic (not at [facility])     Status: Abnormal   Collection Time: 06/29/15  6:42 PM  Result Value Ref Range   Color, Urine YELLOW YELLOW   APPearance CLOUDY (A) CLEAR   Specific Gravity, Urine 1.021 1.005 - 1.030   pH 5.5 5.0 - 8.0   Glucose, UA NEGATIVE NEGATIVE mg/dL   Hgb urine dipstick SMALL (A) NEGATIVE  Bilirubin Urine NEGATIVE NEGATIVE   Ketones, ur 15 (A) NEGATIVE mg/dL   Protein, ur 30 (A) NEGATIVE mg/dL   Urobilinogen, UA 0.2 0.0 - 1.0 mg/dL   Nitrite NEGATIVE NEGATIVE   Leukocytes, UA NEGATIVE NEGATIVE  Urine microscopic-add on     Status: Abnormal   Collection Time: 06/29/15  6:42 PM  Result Value Ref Range   Squamous Epithelial / LPF FEW (A) RARE   WBC, UA 0-2 <3 WBC/hpf   RBC / HPF 0-2 <3 RBC/hpf   Bacteria, UA FEW (A) RARE   Casts HYALINE CASTS (A) NEGATIVE  Urine culture     Status: None   Collection Time: 06/29/15  6:43 PM  Result Value Ref Range   Specimen Description URINE, CLEAN CATCH    Special  Requests NONE    Culture      NO GROWTH 2 DAYS Performed at [facility]    Report Status 07/01/2015 FINAL   Blood culture (routine x 2)     Status: None   Collection Time: 06/29/15  7:05 PM  Result Value Ref Range   Specimen Description BLOOD RIGHT ANTECUBITAL    Special Requests BOTTLES DRAWN AEROBIC AND ANAEROBIC 5CC     Culture      NO GROWTH 5 DAYS Performed at [facility]    Report Status 07/04/2015 FINAL   Lactic acid, plasma     Status: None   Collection Time: 06/29/15  8:26 PM  Result Value Ref Range   Lactic Acid, Venous 1.7 0.5 - 2.0 mmol/L  Comprehensive metabolic panel     Status: Abnormal   Collection Time: 06/30/15  6:00 AM  Result Value Ref Range   Sodium 148 (H) 135 - 145 mmol/L   Potassium 3.4 (L) 3.5 - 5.1 mmol/L    Comment: DELTA CHECK NOTED REPEATED TO VERIFY    Chloride 112 (H) 101 - 111 mmol/L   CO2 29 22 - 32 mmol/L   Glucose, Bld 180 (H) 65 - 99 mg/dL   BUN 60 (H) 6 - 20 mg/dL   Creatinine, Ser 1.05 (H) 0.44 - 1.00 mg/dL   Calcium 9.1 8.9 - 10.3 mg/dL   Total Protein 5.9 (L) 6.5 - 8.1 g/dL   Albumin 2.6 (L) 3.5 - 5.0 g/dL   AST 41 15 - 41 U/L   ALT 23 14 - 54 U/L   Alkaline Phosphatase 70 38 - 126 U/L   Total Bilirubin 1.1 0.3 - 1.2 mg/dL   GFR calc non Af Amer 47 (L) >60 mL/min   GFR calc Af Amer 55 (L) >60 mL/min    Comment: (NOTE) The eGFR has been calculated using the CKD EPI equation. This calculation has not been validated in all clinical situations. eGFR's persistently <60 mL/min signify possible Chronic Kidney Disease.    Anion gap 7 5 - 15  CBC WITH DIFFERENTIAL     Status: Abnormal   Collection Time: 06/30/15  6:00 AM  Result Value Ref Range   WBC 24.7 (H) 4.0 - 10.5 K/uL   RBC 4.73 3.87 - 5.11 MIL/uL   Hemoglobin 13.9 12.0 - 15.0 g/dL    Comment: REPEATED TO VERIFY DELTA CHECK NOTED    HCT 42.3 36.0 - 46.0 %   MCV 89.4 78.0 - 100.0 fL   MCH 29.4 26.0 - 34.0 pg   MCHC 32.9 30.0 - 36.0 g/dL   RDW 12.7 11.5  - 15.5 %   Platelets 227 150 - 400 K/uL   Neutrophils  Relative % 89 %   Neutro Abs 22.1 (H) 1.7 - 7.7 K/uL   Lymphocytes Relative 3 %   Lymphs Abs 0.7 0.7 - 4.0 K/uL   Monocytes Relative 8 %   Monocytes Absolute 2.0 (H) 0.1 - 1.0 K/uL   Eosinophils Relative 0 %   Eosinophils Absolute 0.0 0.0 - 0.7 K/uL   Basophils Relative 0 %   Basophils Absolute 0.0 0.0 - 0.1 K/uL  Magnesium     Status: Abnormal   Collection Time: 06/30/15  6:00 AM  Result Value Ref Range   Magnesium 2.5 (H) 1.7 - 2.4 mg/dL  Hemoglobin A1c     Status: Abnormal   Collection Time: 06/30/15  6:00 AM  Result Value Ref Range   Hgb A1c MFr Bld 6.3 (H) 4.8 - 5.6 %    Comment: (NOTE)         Pre-diabetes: 5.7 - 6.4         Diabetes: >6.4         Glycemic control for adults with diabetes: <7.0    Mean Plasma Glucose 134 mg/dL    Comment: (NOTE) Performed At: [facility] [place], [place] [id number] Lindon Romp MD QJ:[id number]   TSH     Status: None   Collection Time: 06/30/15 10:56 AM  Result Value Ref Range   TSH 0.873 0.350 - 4.500 uIU/mL  Vitamin B12     Status: Abnormal   Collection Time: 06/30/15 10:56 AM  Result Value Ref Range   Vitamin B-12 1449 (H) 180 - 914 pg/mL    Comment: (NOTE) This assay is not validated for testing neonatal or myeloproliferative syndrome specimens for Vitamin B12 levels. Performed at [facility]   Troponin I (q 6hr x 3)     Status: Abnormal   Collection Time: 06/30/15  2:35 PM  Result Value Ref Range   Troponin I 0.10 (H) <0.031 ng/mL    Comment:        PERSISTENTLY INCREASED TROPONIN VALUES IN THE RANGE OF 0.04-0.49 ng/mL CAN BE SEEN IN:       -UNSTABLE ANGINA       -CONGESTIVE HEART FAILURE       -MYOCARDITIS       -CHEST TRAUMA       -ARRYHTHMIAS       -LATE PRESENTING MYOCARDIAL INFARCTION       -COPD   CLINICAL FOLLOW-UP RECOMMENDED.   Troponin I (q 6hr x 3)     Status: Abnormal   Collection Time: 06/30/15  8:22 PM    Result Value Ref Range   Troponin I 0.09 (H) <0.031 ng/mL    Comment:        PERSISTENTLY INCREASED TROPONIN VALUES IN THE RANGE OF 0.04-0.49 ng/mL CAN BE SEEN IN:       -UNSTABLE ANGINA       -CONGESTIVE HEART FAILURE       -MYOCARDITIS       -CHEST TRAUMA       -ARRYHTHMIAS       -LATE PRESENTING MYOCARDIAL INFARCTION       -COPD   CLINICAL FOLLOW-UP RECOMMENDED.   CBC     Status: Abnormal   Collection Time: 07/01/15  5:48 AM  Result Value Ref Range   WBC 19.7 (H) 4.0 - 10.5 K/uL   RBC 4.24 3.87 - 5.11 MIL/uL   Hemoglobin 12.4 12.0 - 15.0 g/dL   HCT 38.2 36.0 - 46.0 %   MCV 90.1 78.0 -  100.0 fL   MCH 29.2 26.0 - 34.0 pg   MCHC 32.5 30.0 - 36.0 g/dL   RDW 12.9 11.5 - 15.5 %   Platelets 195 150 - 400 K/uL  Basic metabolic panel     Status: Abnormal   Collection Time: 07/01/15  5:48 AM  Result Value Ref Range   Sodium 140 135 - 145 mmol/L    Comment: DELTA CHECK NOTED REPEATED TO VERIFY    Potassium 3.7 3.5 - 5.1 mmol/L   Chloride 106 101 - 111 mmol/L   CO2 28 22 - 32 mmol/L   Glucose, Bld 132 (H) 65 - 99 mg/dL   BUN 36 (H) 6 - 20 mg/dL   Creatinine, Ser 0.94 0.44 - 1.00 mg/dL   Calcium 8.9 8.9 - 10.3 mg/dL   GFR calc non Af Amer 54 (L) >60 mL/min   GFR calc Af Amer >60 >60 mL/min    Comment: (NOTE) The eGFR has been calculated using the CKD EPI equation. This calculation has not been validated in all clinical situations. eGFR's persistently <60 mL/min signify possible Chronic Kidney Disease.    Anion gap 6 5 - 15     Past Medical History  Diagnosis Date  . Diabetes mellitus without complication ([place])   . Hypertension   . Goiter   . Aortic stenosis   . Rheumatic fever     Current Outpatient Prescriptions on File Prior to Visit  Medication Sig Dispense Refill  . Acetaminophen (TYLENOL PO) Take 1 tablet by mouth daily as needed (pain).    Marland Kitchen aspirin EC 81 MG tablet Take 81 mg by mouth daily as needed for mild pain.    . collagenase (SANTYL) ointment  Apply topically daily. Apply to left gluteus area once a day. 15 g 0  . indapamide (LOZOL) 1.25 MG tablet Take 1.25 mg by mouth every morning.    Marland Kitchen levofloxacin (LEVAQUIN) 750 MG tablet Take 1 tablet (750 mg total) by mouth every other day. 4 tablet 0  . losartan (COZAAR) 25 MG tablet Take 25 mg by mouth daily.    . Multiple Vitamin (MULTIVITAMIN WITH MINERALS) TABS tablet Take 1 tablet by mouth daily.    Marland Kitchen saccharomyces boulardii (FLORASTOR) 250 MG capsule Take 1 capsule (250 mg total) by mouth 2 (two) times daily. 30 capsule 0  . TENORMIN 50 MG tablet Take 50 mg by mouth daily.  0    Social: The patient states that she lives in her own home and [place]. Claims to be independent with ADLs and IDL's. Was not using an ambulatory assist device. She has 4 children one of whom lives in [place].  reports that she has never smoked. She does not have any smokeless tobacco history on file. She reports that she does not drink alcohol or use illicit drugs.  indicated that her mother is deceased. She indicated that her father is deceased. She indicated that four of her five sisters are alive.   Review of systems Gen. patient relates a 20 pound weight loss over the last 6 months HEENT no visual complaints Respiratory; nonsmoker no shortness of breath on exertion no cough Cardiac no chest pain palpitations or history of syncope GI; patient states that she has mid retrosternal dysphagia mostly due to thick meats. Has to wash things down with fluids. There is no pain. No abdominal pain no nausea vomiting or change in bowel habits. GU has suprapubic pain but no dysuria or hematuria Musculoskeletal no joint pain Neurologic; does not  complain of focal weakness or loss of sensation Skin multiple areas of skin pressure ulceration especially the right buttock in discussion with the wound care nurse. Mental status; states she feels depressed because one of her children hasn't visited  Physical  examination Gen. somewhat depressed-looking elderly woman Vitals; O2 sat is 95% on room air respirations 18 pulse rate 92 HEENT; eyes look normal oral exam; moist mucous membranes appears to be a.Elonda Husky. Breasts; no masses Lymph; nonpalpable in the cervical clavicular or axillary areas Respiratory; clear entry bilaterally Cardiac heart sounds are normal there is a 3/6 pansystolic murmur that radiates into the axilla and heard all over the precordium Abdomen; no liver no spleen no tenderness no masses GU; there is suprapubic tenderness without guarding. No fullness to suggest urinary retention. No CVA tenderness Extremities; there is no active joints. Vascular peripheral pulses are absent below the femoral Neurologic;  Cranial nerves tongue deviates to the left otherwise normal  Motor; left pronator drift 3 out of 5 hip flexor weakness bilaterally as well as hip abductor weakness bilaterally. Distally 4 out of 5 equal bilaterally  Reflexes symmetric and normal right plantar is extensor left is flexor  Gait I did not attempt this. Skin; there are several areas of concern here. The major one is a large unstageable wound over the right gluteus area. There was no surrounding crepitus but some tenderness. There was a large tan Brown surface eschar. There is a black eschar over the left heel Mental status; depressed affect. Although there was some question of dementia the patient really does quite well and my general probing questions. She is orientated including the day she came here was able to tell me her address somewhat vague on the details of her admission to hospital but once again I don't know that there is an abnormality here.  Impression/plan #1 "found down" at home. The exact circumstances of this are totally unclear. #2 unstageable pressure areas over the right gluteal, left heel, left leg, left scapula. The only areas that are concerning however R the right gluteal and and to a lesser  extent the left heel. The right gluteal underwent an extensive surgical debridement of necrotic skin and subcutaneous tissue and fat. Even with this I did not get to a viable base. There was copious amounts of liquefied material either pus or necrotic material perhaps both. A culture was done of this. She is going to need parenteral antibiotics #3 leukocytosis. I would certainly worry about the gluteal area being the source of this although looking back in [place] this lady has had leukocytosis with a left shift for a number of years I would wonder about this being an early myeloproliferative state. She probably needs a peripheral smear to pathology. #4 profound lower extremity weakness proximally. Exact cause of this is not clear based on a careful bedside exam. This may all be disuse. #5 I would wonder about bilateral CVAs, her admission CT scan showed small vessel disease and atrophy #6 hypernatremia hospital which was corrected; likely secondary to her prolonged recumbency. #7 weight loss of 20 pounds per the patient with a sensation of dysphagia. She is going to need a barium swallow as a screening for an esophageal mass or stricture  Procedure note; extensive surgical debridement of the right gluteal unstageable pressure area. This removed necrotic subcutaneous tissue including skin, and necrotic subcutaneous fat. We used a #10 blade and pickups. There was no bleeding. The patient tolerated this reasonably well  The patient will need parenteral antibiotics I'm going to start her on [place] awaiting cultures from this wound. Change addressing this over alginate. Ask for therapy assistance with pulse lavage. She will need speech therapy as well as a esophagogram.

## 2015-07-14 ENCOUNTER — Non-Acute Institutional Stay (SKILLED_NURSING_FACILITY): Payer: Medicare PPO | Admitting: Internal Medicine

## 2015-07-14 DIAGNOSIS — L89310 Pressure ulcer of right buttock, unstageable: Secondary | ICD-10-CM | POA: Diagnosis not present

## 2015-07-14 DIAGNOSIS — L03317 Cellulitis of buttock: Secondary | ICD-10-CM

## 2015-07-14 DIAGNOSIS — I350 Nonrheumatic aortic (valve) stenosis: Secondary | ICD-10-CM | POA: Diagnosis not present

## 2015-07-14 DIAGNOSIS — E876 Hypokalemia: Secondary | ICD-10-CM | POA: Diagnosis not present
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
## 2015-07-21 ENCOUNTER — Non-Acute Institutional Stay (SKILLED_NURSING_FACILITY): Payer: Medicare PPO | Admitting: Internal Medicine

## 2015-07-21 DIAGNOSIS — I350 Nonrheumatic aortic (valve) stenosis: Secondary | ICD-10-CM | POA: Diagnosis not present

## 2015-07-21 DIAGNOSIS — L89620 Pressure ulcer of left heel, unstageable: Secondary | ICD-10-CM | POA: Diagnosis not present

## 2015-07-21 DIAGNOSIS — L89310 Pressure ulcer of right buttock, unstageable: Secondary | ICD-10-CM

## 2015-07-28 ENCOUNTER — Non-Acute Institutional Stay (SKILLED_NURSING_FACILITY): Payer: Medicare PPO | Admitting: Internal Medicine

## 2015-07-28 DIAGNOSIS — I350 Nonrheumatic aortic (valve) stenosis: Secondary | ICD-10-CM

## 2015-07-28 DIAGNOSIS — L03317 Cellulitis of buttock: Secondary | ICD-10-CM | POA: Diagnosis not present

## 2015-07-28 DIAGNOSIS — L89310 Pressure ulcer of right buttock, unstageable: Secondary | ICD-10-CM | POA: Diagnosis not present
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
## 2015-08-04 ENCOUNTER — Non-Acute Institutional Stay (SKILLED_NURSING_FACILITY): Payer: Medicare PPO | Admitting: Internal Medicine

## 2015-08-04 DIAGNOSIS — L03317 Cellulitis of buttock: Secondary | ICD-10-CM

## 2015-08-04 DIAGNOSIS — R634 Abnormal weight loss: Secondary | ICD-10-CM | POA: Diagnosis not present

## 2015-08-04 DIAGNOSIS — R1314 Dysphagia, pharyngoesophageal phase: Secondary | ICD-10-CM

## 2015-08-04 DIAGNOSIS — L89323 Pressure ulcer of left buttock, stage 3: Secondary | ICD-10-CM
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
## 2015-08-06 ENCOUNTER — Emergency Department (HOSPITAL_COMMUNITY)
Admission: EM | Admit: 2015-08-06 | Discharge: 2015-08-06 | Disposition: A | Discharge status: Home / Self Care | Payer: Medicare PPO | Attending: Emergency Medicine | Admitting: Emergency Medicine

## 2015-08-06 ENCOUNTER — Encounter (HOSPITAL_COMMUNITY): Payer: Self-pay | Admitting: Emergency Medicine

## 2015-08-06 DIAGNOSIS — I82411 Acute embolism and thrombosis of right femoral vein: Secondary | ICD-10-CM | POA: Diagnosis not present

## 2015-08-06 DIAGNOSIS — Z880 Allergy status to penicillin: Secondary | ICD-10-CM | POA: Diagnosis not present

## 2015-08-06 DIAGNOSIS — I10 Essential (primary) hypertension: Secondary | ICD-10-CM | POA: Diagnosis not present

## 2015-08-06 DIAGNOSIS — M79669 Pain in unspecified lower leg: Secondary | ICD-10-CM | POA: Diagnosis present

## 2015-08-06 DIAGNOSIS — L98499 Non-pressure chronic ulcer of skin of other sites with unspecified severity: Secondary | ICD-10-CM | POA: Insufficient documentation

## 2015-08-06 DIAGNOSIS — I7090 Unspecified atherosclerosis: Secondary | ICD-10-CM

## 2015-08-06 DIAGNOSIS — Z79899 Other long term (current) drug therapy: Secondary | ICD-10-CM | POA: Diagnosis not present

## 2015-08-06 DIAGNOSIS — Z7982 Long term (current) use of aspirin: Secondary | ICD-10-CM | POA: Insufficient documentation

## 2015-08-06 DIAGNOSIS — Z8619 Personal history of other infectious and parasitic diseases: Secondary | ICD-10-CM | POA: Diagnosis not present

## 2015-08-06 DIAGNOSIS — I749 Embolism and thrombosis of unspecified artery: Secondary | ICD-10-CM | POA: Insufficient documentation

## 2015-08-06 DIAGNOSIS — L97419 Non-pressure chronic ulcer of right heel and midfoot with unspecified severity: Secondary | ICD-10-CM | POA: Diagnosis not present

## 2015-08-06 DIAGNOSIS — E119 Type 2 diabetes mellitus without complications: Secondary | ICD-10-CM | POA: Diagnosis not present

## 2015-08-06 DIAGNOSIS — I82401 Acute embolism and thrombosis of unspecified deep veins of right lower extremity: Secondary | ICD-10-CM

## 2015-08-06 LAB — CBC WITH DIFFERENTIAL/PLATELET
Basophils Absolute: 0 10*3/uL (ref 0.0–0.1)
Basophils Relative: 0 %
EOS ABS: 0.4 10*3/uL (ref 0.0–0.7)
EOS PCT: 2 %
HCT: 38 % (ref 36.0–46.0)
Hemoglobin: 12.5 g/dL (ref 12.0–15.0)
Lymphocytes Relative: 14 %
Lymphs Abs: 2.1 10*3/uL (ref 0.7–4.0)
MCH: 28.6 pg (ref 26.0–34.0)
MCHC: 32.9 g/dL (ref 30.0–36.0)
MCV: 87 fL (ref 78.0–100.0)
Monocytes Absolute: 1 10*3/uL (ref 0.1–1.0)
Monocytes Relative: 6 %
Neutro Abs: 12.2 10*3/uL — ABNORMAL HIGH (ref 1.7–7.7)
Neutrophils Relative %: 78 %
PLATELETS: 312 10*3/uL (ref 150–400)
RBC: 4.37 MIL/uL (ref 3.87–5.11)
RDW: 13.1 % (ref 11.5–15.5)
WBC: 15.8 10*3/uL — AB (ref 4.0–10.5)

## 2015-08-06 LAB — CBG MONITORING, ED: GLUCOSE-CAPILLARY: 91 mg/dL (ref 65–99)

## 2015-08-06 LAB — I-STAT CHEM 8, ED
BUN: 23 mg/dL — ABNORMAL HIGH (ref 6–20)
CALCIUM ION: 1.29 mmol/L (ref 1.13–1.30)
Chloride: 98 mmol/L — ABNORMAL LOW (ref 101–111)
Creatinine, Ser: 1 mg/dL (ref 0.44–1.00)
GLUCOSE: 105 mg/dL — AB (ref 65–99)
HCT: 38 % (ref 36.0–46.0)
Hemoglobin: 12.9 g/dL (ref 12.0–15.0)
Potassium: 3.9 mmol/L (ref 3.5–5.1)
SODIUM: 136 mmol/L (ref 135–145)
TCO2: 26 mmol/L (ref 0–100)

## 2015-08-06 MED ORDER — SODIUM CHLORIDE 0.9 % IV BOLUS (SEPSIS)
1000.0000 mL | Freq: Once | INTRAVENOUS | Status: AC
  Administered 2015-08-06: 1000 mL via INTRAVENOUS

## 2015-08-06 NOTE — ED Notes (Signed)
Doppler pulse present in right foot. 76 bmp with doppler.

## 2015-08-06 NOTE — ED Notes (Signed)
Attempted to call report to the nursing facility.

## 2015-08-06 NOTE — ED Notes (Signed)
Planning for follow up with vascular surgeon.

## 2015-08-06 NOTE — ED Notes (Signed)
Patient comes from [facility] for leg pain, suspected blood clot. Doppler studies arrived with patient with exam results from 08/05/15 with "a positive duplex study with thrombus in the right common femoral vein". Patient received a dose of lovenox at midnight 08/05/15, and sent here. Vs with ems: bp 155/80, p 80, o2 sat 99% on room air, rr 16.
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
## 2015-08-06 NOTE — ED Provider Notes (Signed)
CSN: [id number]     Arrival date & time 08/06/15  [id number] History   First MD Initiated Contact with Patient 08/06/15 0435     Chief Complaint  Patient presents with  . Leg Pain     (Consider location/radiation/quality/duration/timing/severity/associated sxs/prior Treatment) HPI  Jasmine Fernandez is a 81 y.o. female with PMH pf HTN, aortic stenosis, presenting today for evaluation of an ultrasound read.  She does not know why she had an ultrasound test done.  She denies pain or swelling in her leg, but states that her heel has hurt.  She states the ulcer has been there for 3 years.  She denies history of blood clots.  US at her facility revealed a R common femoral vein DVT and a R SFA total occlusion with poor collaterals.  She has no further complaints.   10 Systems reviewed and are negative for acute change except as noted in the HPI.   Past Medical History  Diagnosis Date  . Hypertension   . Goiter   . Aortic stenosis   . Rheumatic fever   . Diabetes mellitus without complication [facility](HCC)     patient states she is not diabetic.    Past Surgical History  Procedure Laterality Date  . Tonsillectomy      During Childhood   Family History  Problem Relation Age of Onset  . Heart disease Father   . Heart attack Father    Social History  Substance Use Topics  . Smoking status: Never Smoker   . Smokeless tobacco: None  . Alcohol Use: No   OB History    No data available     Review of Systems    Allergies  Penicillins  Home Medications   Prior to Admission medications   Medication Sig Start Date End Date Taking? Authorizing Provider  Acetaminophen (TYLENOL PO) Take 1 tablet by mouth daily as needed (pain).    Historical Provider, MD  aspirin EC 81 MG tablet Take 81 mg by mouth daily as needed for mild pain.    Historical Provider, MD  collagenase (SANTYL) ointment Apply topically daily. Apply to left gluteus area once a day. 07/03/15   Marinda ElkAbraham Feliz Ortiz, MD   indapamide (LOZOL) 1.25 MG tablet Take 1.25 mg by mouth every morning.    Historical Provider, MD  levofloxacin (LEVAQUIN) 750 MG tablet Take 1 tablet (750 mg total) by mouth every other day. 07/03/15   Marinda ElkAbraham Feliz Ortiz, MD  losartan (COZAAR) 25 MG tablet Take 25 mg by mouth daily.    Historical Provider, MD  Multiple Vitamin (MULTIVITAMIN WITH MINERALS) TABS tablet Take 1 tablet by mouth daily.    Historical Provider, MD  saccharomyces boulardii (FLORASTOR) 250 MG capsule Take 1 capsule (250 mg total) by mouth 2 (two) times daily. 07/03/15   Marinda ElkAbraham Feliz Ortiz, MD  TENORMIN 50 MG tablet Take 50 mg by mouth daily. 04/06/15   Historical Provider, MD   BP 134/51 mmHg  Pulse 76  Temp(Src) 98.3 F (36.8 C) (Oral)  Resp 21  Ht 5' (1.524 m)  SpO2 96% Physical Exam  Constitutional: She is oriented to person, place, and time. She appears well-developed and well-nourished. No distress.  HENT:  Head: Normocephalic and atraumatic.  Nose: Nose normal.  Mouth/Throat: Oropharynx is clear and moist. No oropharyngeal exudate.  Eyes: Conjunctivae and EOM are normal. Pupils are equal, round, and reactive to light. No scleral icterus.  Neck: Normal range of motion. Neck supple. No JVD present. No tracheal deviation  present. No thyromegaly present.  Cardiovascular: Normal rate, regular rhythm and normal heart sounds.  Exam reveals no gallop and no friction rub.   No murmur heard. Pulmonary/Chest: Effort normal and breath sounds normal. No respiratory distress. She has no wheezes. She exhibits no tenderness.  Abdominal: Soft. Bowel sounds are normal. She exhibits no distension and no mass. There is no tenderness. There is no rebound and no guarding.  Musculoskeletal: Normal range of motion. She exhibits no edema or tenderness.  Pulses can be found with doppler on R DP, can not palpate the pulse on the R.  Easily palpated on the left.  Lymphadenopathy:    She has no cervical adenopathy.  Neurological:  She is alert and oriented to person, place, and time. No cranial nerve deficit. She exhibits normal muscle tone.  Skin: Skin is warm and dry. No rash noted. No erythema. No pallor.  Sacral ulcer.  R heel unstagable ulcer, no signs of infection  BLE are warm to touch  Nursing note and vitals reviewed.   ED Course  Procedures (including critical care time) Labs Review Labs Reviewed  CBC WITH DIFFERENTIAL/PLATELET - Abnormal; Notable for the following:    WBC 15.8 (*)    Neutro Abs 12.2 (*)    All other components within normal limits  I-STAT CHEM 8, ED - Abnormal; Notable for the following:    Chloride 98 (*)    BUN 23 (*)    Glucose, Bld 105 (*)    All other components within normal limits    Imaging Review No results found. I have personally reviewed and evaluated these images and lab results as part of my medical decision-making.   EKG Interpretation None      MDM   Final diagnoses:  None    Patient presents to the ED for further evaluation of her [place] results.  I spoke with Dr. Hart Rochester who does not recommend CTA and states she needs an angiogram and outpatient work up.  He has agreed to see the patient in the ED in the morning.  Patient will be held for disposition per Dr. Hart Rochester.  Patient will be signed out to oncoming ED provider.   Tomasita Crumble, MD 08/06/15 [phone]

## 2015-08-06 NOTE — ED Notes (Signed)
MD at bedside. 

## 2015-08-06 NOTE — Discharge Instructions (Signed)
Please contact Dr. Candie ChromanLawson's office to assure your follow-up evaluation. Deep Vein Thrombosis A deep vein thrombosis (DVT) is a blood clot (thrombus) that usually occurs in a deep, larger vein of the lower leg or the pelvis, or in an upper extremity such as the arm. These are dangerous and can lead to serious and even life-threatening complications if the clot travels to the lungs. A DVT can damage the valves in your leg veins so that instead of flowing upward, the blood pools in the lower leg. This is called post-thrombotic syndrome, and it can result in pain, swelling, discoloration, and sores on the leg. CAUSES A DVT is caused by the formation of a blood clot in your leg, pelvis, or arm. Usually, several things contribute to the formation of blood clots. A clot may develop when:  Your blood flow slows down.  Your vein becomes damaged in some way.  You have a condition that makes your blood clot more easily. RISK FACTORS A DVT is more likely to develop in:  People who are older, especially over 79 years of age.  People who are overweight (obese).  People who sit or lie still for a long time, such as during long-distance travel (over 4 hours), bed rest, hospitalization, or during recovery from certain medical conditions like a stroke.  People who do not engage in much physical activity (sedentary lifestyle).  People who have chronic breathing disorders.  People who have a personal or family history of blood clots or blood clotting disease.  People who have peripheral vascular disease (PVD), diabetes, or some types of cancer.  People who have heart disease, especially if the person had a recent heart attack or has congestive heart failure.  People who have neurological diseases that affect the legs (leg paresis).  People who have had a traumatic injury, such as breaking a hip or leg.  People who have recently had major or lengthy surgery, especially on the hip, knee, or  abdomen.  People who have had a central line placed inside a large vein.  People who take medicines that contain the hormone estrogen. These include birth control pills and hormone replacement therapy.  Pregnancy or during childbirth or the postpartum period.  Long plane flights (over 8 hours). SIGNS AND SYMPTOMS Symptoms of a DVT can include:   Swelling of your leg or arm, especially if one side is much worse.  Warmth and redness of your leg or arm, especially if one side is much worse.  Pain in your arm or leg. If the clot is in your leg, symptoms may be more noticeable or worse when you stand or walk.  A feeling of pins and needles, if the clot is in the arm. The symptoms of a DVT that has traveled to the lungs (pulmonary embolism, PE) usually start suddenly and include:  Shortness of breath while active or at rest.  Coughing or coughing up blood or blood-tinged mucus.  Chest pain that is often worse with deep breaths.  Rapid or irregular heartbeat.  Feeling light-headed or dizzy.  Fainting.  Feeling anxious.  Sweating. There may also be pain and swelling in a leg if that is where the blood clot started. These symptoms may represent a serious problem that is an emergency. Do not wait to see if the symptoms will go away. Get medical help right away. Call your local emergency services (911 in the [place] Do not drive yourself to the hospital. DIAGNOSIS Your health care provider will take a  medical history and perform a physical exam. You may also have other tests, including:  Blood tests to assess the clotting properties of your blood.  Imaging tests, such as CT, ultrasound, MRI, X-ray, and other tests to see if you have clots anywhere in your body. TREATMENT After a DVT is identified, it can be treated. The type of treatment that you receive depends on many factors, such as the cause of your DVT, your risk for bleeding or developing more clots, and other medical  conditions that you have. Sometimes, a combination of treatments is necessary. Treatment options may be combined and include:  Monitoring the blood clot with ultrasound.  Taking medicines by mouth, such as newer blood thinners (anticoagulants), thrombolytics, or warfarin.  Taking anticoagulant medicine by injection or through an IV tube.  Wearing compression stockings or using different types ofdevices.  Surgery (rare) to remove the blood clot or to place a filter in your abdomen to stop the blood clot from traveling to your lungs. Treatments for a DVT are often divided into immediate treatment and long-term treatment (up to 3 months after DVT). You can work with your health care provider to choose the treatment program that is best for you. HOME CARE INSTRUCTIONS If you are taking a newer oral anticoagulant:  Take the medicine every single day at the same time each day.  Understand what foods and drugs interact with this medicine.  Understand that there are no regular blood tests required when using this medicine.  Understand the side effects of this medicine, including excessive bruising or bleeding. Ask your health care provider or pharmacist about other possible side effects. If you are taking warfarin:  Understand how to take warfarin and know which foods can affect how warfarin works in Veterinary surgeon.  Understand that it is dangerous to take too much or too little warfarin. Too much warfarin increases the risk of bleeding. Too little warfarin continues to allow the risk for blood clots.  Follow your PT and INR blood testing schedule. The PT and INR results allow your health care provider to adjust your dose of warfarin. It is very important that you have your PT and INR tested as often as told by your health care provider.  Avoid major changes in your diet, or tell your health care provider before you change your diet. Arrange a visit with a registered dietitian to answer your  questions. Many foods, especially foods that are high in vitamin K, can interfere with warfarin and affect the PT and INR results. Eat a consistent amount of foods that are high in vitamin K, such as:  Spinach, kale, broccoli, cabbage, collard greens, turnip greens, Brussels sprouts, peas, cauliflower, seaweed, and parsley.  Beef liver and pork liver.  Green tea.  Soybean oil.  Tell your health care provider about any and all medicines, vitamins, and supplements that you take, including aspirin and other over-the-counter anti-inflammatory medicines. Be especially cautious with aspirin and anti-inflammatory medicines. Do not take those before you ask your health care provider if it is safe to do so. This is important because many medicines can interfere with warfarin and affect the PT and INR results.  Do not start or stop taking any over-the-counter or prescription medicine unless your health care provider or pharmacist tells you to do so. If you take warfarin, you will also need to do these things:  Hold pressure over cuts for longer than usual.  Tell your dentist and other health care providers that you  are taking warfarin before you have any procedures in which bleeding may occur.  Avoid alcohol or drink very small amounts. Tell your health care provider if you change your alcohol intake.  Do not use tobacco products, including cigarettes, chewing tobacco, and e-cigarettes. If you need help quitting, ask your health care provider.  Avoid contact sports. General Instructions  Take over-the-counter and prescription medicines only as told by your health care provider. Anticoagulant medicines can have side effects, including easy bruising and difficulty stopping bleeding. If you are prescribed an anticoagulant, you will also need to do these things:  Hold pressure over cuts for longer than usual.  Tell your dentist and other health care providers that you are taking anticoagulants before  you have any procedures in which bleeding may occur.  Avoid contact sports.  Wear a medical alert bracelet or carry a medical alert card that says you have had a PE.  Ask your health care provider how soon you can go back to your normal activities. Stay active to prevent new blood clots from forming.  Make sure to exercise while traveling or when you have been sitting or standing for a long period of time. It is very important to exercise. Exercise your legs by walking or by tightening and relaxing your leg muscles often. Take frequent walks.  Wear compression stockings as told by your health care provider to help prevent more blood clots from forming.  Do not use tobacco products, including cigarettes, chewing tobacco, and e-cigarettes. If you need help quitting, ask your health care provider.  Keep all follow-up appointments with your health care provider. This is important. PREVENTION Take these actions to decrease your risk of developing another DVT:  Exercise regularly. For at least 30 minutes every day, engage in:  Activity that involves moving your arms and legs.  Activity that encourages good blood flow through your body by increasing your heart rate.  Exercise your arms and legs every hour during long-distance travel (over 4 hours). Drink plenty of water and avoid drinking alcohol while traveling.  Avoid sitting or lying in bed for long periods of time without moving your legs.  Maintain a weight that is appropriate for your height. Ask your health care provider what weight is healthy for you.  If you are a woman who is over 59 years of age, avoid unnecessary use of medicines that contain estrogen. These include birth control pills.  Do not smoke, especially if you take estrogen medicines. If you need help quitting, ask your health care provider. If you are hospitalized, prevention measures may include:  Early walking after surgery, as soon as your health care provider  says that it is safe.  Receiving anticoagulants to prevent blood clots.If you cannot take anticoagulants, other options may be available, such as wearing compression stockings or using different types of devices. SEEK IMMEDIATE MEDICAL CARE IF:  You have new or increased pain, swelling, or redness in an arm or leg.  You have numbness or tingling in an arm or leg.  You have shortness of breath while active or at rest.  You have chest pain.  You have a rapid or irregular heartbeat.  You feel light-headed or dizzy.  You cough up blood.  You notice blood in your vomit, bowel movement, or urine. These symptoms may represent a serious problem that is an emergency. Do not wait to see if the symptoms will go away. Get medical help right away. Call your local emergency services (911 in  the [place] Do not drive yourself to the hospital.   This information is not intended to replace advice given to you by your health care provider. Make sure you discuss any questions you have with your health care provider.   Document Released: 09/05/2005 Document Revised: 05/27/2015 Document Reviewed: 12/31/2014 Elsevier Interactive Patient Education [employer].

## 2015-08-06 NOTE — ED Notes (Signed)
Dr. Oni at the bedside.  

## 2015-08-07 ENCOUNTER — Non-Acute Institutional Stay (SKILLED_NURSING_FACILITY): Payer: Medicare PPO | Admitting: Internal Medicine

## 2015-08-07 DIAGNOSIS — I739 Peripheral vascular disease, unspecified: Secondary | ICD-10-CM | POA: Diagnosis not present

## 2015-08-07 DIAGNOSIS — I82401 Acute embolism and thrombosis of unspecified deep veins of right lower extremity: Secondary | ICD-10-CM

## 2015-08-07 DIAGNOSIS — L89620 Pressure ulcer of left heel, unstageable: Secondary | ICD-10-CM | POA: Diagnosis not present

## 2015-08-10 ENCOUNTER — Other Ambulatory Visit: Payer: Self-pay

## 2015-08-11 ENCOUNTER — Non-Acute Institutional Stay (SKILLED_NURSING_FACILITY): Payer: Medicare PPO | Admitting: Internal Medicine

## 2015-08-11 DIAGNOSIS — I739 Peripheral vascular disease, unspecified: Secondary | ICD-10-CM

## 2015-08-11 DIAGNOSIS — I82401 Acute embolism and thrombosis of unspecified deep veins of right lower extremity: Secondary | ICD-10-CM | POA: Diagnosis not present

## 2015-08-11 DIAGNOSIS — F324 Major depressive disorder, single episode, in partial remission: Secondary | ICD-10-CM | POA: Diagnosis not present

## 2015-08-11 DIAGNOSIS — L89620 Pressure ulcer of left heel, unstageable: Secondary | ICD-10-CM

## 2015-08-11 DIAGNOSIS — L89310 Pressure ulcer of right buttock, unstageable: Secondary | ICD-10-CM

## 2015-08-11 DIAGNOSIS — R1314 Dysphagia, pharyngoesophageal phase: Secondary | ICD-10-CM | POA: Diagnosis not present

## 2015-08-11 NOTE — Progress Notes (Addendum)
Patient ID: Jyl Heinzauline D Boehning, female   DOB: 09/08/1929, 81 y.o.   MRN: [id number]                PROGRESS NOTE  DATE:  08/07/2015         [facility]                     LEVEL OF CARE:   SNF   Acute Visit                  CHIEF COMPLAINT:  Follow up DVT, chronic arterial occlusion.      HISTORY OF PRESENT ILLNESS:  This patient is a lady I saw three days ago.  She came here with a substantial wound over her left gluteal, left scapula, and also an area over her right heel with eschar which has progressively hardened since she has been here.  All of this is probably a result of a prolonged "downtime" at home for which she was originally admitted to hospital.  The entire issue is complicated at present by very poor oral intake, progressive protein calorie malnutrition.      As part of my work-up three days ago, I ordered arterial dopplers, ABIs to the right leg to assess the stability of the right heel wound for debridement.  For reasons that are still not quite clear, she did not just have arterial dopplers, she had venous dopplers.   The venous doppler showed a partially occluded DVT in the common femoral vein.  The arterial doppler I ordered showed significantly compromised arterial inflow with a complete occlusion of the right superficial femoral artery and poor distal collateralization.  She was sent to the emergency room because of the venous and arterial findings, saw Dr. Hart RochesterLawson in the emergency room who has recommended an arteriogram, to be arranged after the Thanksgiving Day vacation.  She was put on Lovenox at 50 mg b.i.d.           CURRENT MEDICATIONS:  Medication list is reviewed.             Pro-Stat 30 mL b.i.d.      Boost nutritional supplement 250 twice daily.    Remeron 15 mg p.o. q.h.s., being given for a combination of depression and poor oral intake.    Lozol 25 mg daily.    Cozaar 25 p.o. q.d.    Tenormin 50 q.d.      Florastor 250 b.i.d.      K-Dur  20 mEq p.o. daily.    Lovenox 50 mg subcu b.i.d.      REVIEW OF SYSTEMS:    HEENT:   No headache.    CHEST/RESPIRATORY:  No shortness of breath or inspiratory chest pain.     CARDIAC:  No exertional chest pain.   GI:  No nausea, vomiting, or diarrhea.             MUSCULOSKELETAL:  She is complaining of pain in the right heel.    PHYSICAL EXAMINATION:   GENERAL APPEARANCE:  The patient is as usual.  Awake, alert, but does not communicate much.  She is a very vague historian.   HEENT:   MOUTH/THROAT:  Mucous membranes are moist.   CHEST/RESPIRATORY:  Clear air entry bilaterally.    CARDIOVASCULAR:   CARDIAC:  Harsh 3/6 systolic ejection murmur radiating to at least the right carotid.  There is no S3.   GASTROINTESTINAL:   LIVER/SPLEEN/KIDNEYS:  No liver, no spleen.  No tenderness.     GENITOURINARY:   BLADDER:  No suprapubic or costovertebral angle tenderness.   CIRCULATION:   EDEMA/VARICOSITIES:  Extremities:  She does not have any edema.  I am assuming the DVT pick-up was incidental.  I did not order this initially.  There was not any reason to at the bedside.   ARTERIAL:  Peripheral pulses as per my previous exam, absent below the femoral and faintly present there bilaterally.  Her foot is warm.  Certainly no evidence of acute arterial insufficiency.    ASSESSMENT/PLAN:                 Severe PAD.  The finding that there is any collateralization of her complete occlusion of the right SFA suggests that this is not new.  She saw Dr. Hart Rochester already in the emergency room.  He said an arteriogram will be arranged after Thanksgiving.  We will look forward to that after the holiday.  I do not think there is any reason to go at this more aggressively as the foot itself does not look to be threatened.    I believe an incidental common femoral vein DVT.  She is on Lovenox.  As she is going to go for an arteriogram, I am going to continue her on the Lovenox.  After we know the results of this,  consider changing her to an alternative oral agent.    Protein calorie malnutrition.  This is actually the issue that is most concerning to me at this point.  I will need to check her recent lab work which I had ordered for earlier this week.    Stage III-IV pressure ulceration over the left gluteal.   This had improved with a wound VAC in spite of her protein calorie malnutrition, although there is not a wound VAC on now.  This is not overtly infected although she has had an elevated white count which, looking back through her records, has been present for quite a while.    I do not really know who this patient's responsible party is, if she has one.  The patient actually may speak for herself, although I am not totally confident of her ability to do so.  She was able to talk about her presentation after falling at home when she first came in, although I did not delve aggressively into her cognitive status.  It would seem reasonable now that this would need to be done to determine her competency to make complex medical decisions, including interventions in regards to her arterial insufficiency and possible enteral feeding.

## 2015-08-19 ENCOUNTER — Ambulatory Visit (HOSPITAL_COMMUNITY)
Admission: RE | Admit: 2015-08-19 | Discharge: 2015-08-19 | Disposition: A | Discharge status: Home / Self Care | Payer: Medicare PPO | Source: Ambulatory Visit | Attending: Surgery | Admitting: Surgery

## 2015-08-19 ENCOUNTER — Encounter (HOSPITAL_COMMUNITY)
Admission: RE | Disposition: A | Discharge status: Home / Self Care | Payer: Self-pay | Source: Ambulatory Visit | Attending: Surgery

## 2015-08-19 DIAGNOSIS — Z880 Allergy status to penicillin: Secondary | ICD-10-CM | POA: Diagnosis not present

## 2015-08-19 DIAGNOSIS — I70234 Atherosclerosis of native arteries of right leg with ulceration of heel and midfoot: Secondary | ICD-10-CM | POA: Insufficient documentation

## 2015-08-19 DIAGNOSIS — I701 Atherosclerosis of renal artery: Secondary | ICD-10-CM | POA: Insufficient documentation

## 2015-08-19 DIAGNOSIS — E049 Nontoxic goiter, unspecified: Secondary | ICD-10-CM | POA: Diagnosis not present

## 2015-08-19 DIAGNOSIS — I350 Nonrheumatic aortic (valve) stenosis: Secondary | ICD-10-CM | POA: Insufficient documentation

## 2015-08-19 DIAGNOSIS — E1152 Type 2 diabetes mellitus with diabetic peripheral angiopathy with gangrene: Secondary | ICD-10-CM | POA: Diagnosis not present

## 2015-08-19 DIAGNOSIS — Z7982 Long term (current) use of aspirin: Secondary | ICD-10-CM | POA: Diagnosis not present

## 2015-08-19 DIAGNOSIS — L89619 Pressure ulcer of right heel, unspecified stage: Secondary | ICD-10-CM | POA: Diagnosis not present

## 2015-08-19 DIAGNOSIS — Z8249 Family history of ischemic heart disease and other diseases of the circulatory system: Secondary | ICD-10-CM | POA: Insufficient documentation

## 2015-08-19 DIAGNOSIS — I10 Essential (primary) hypertension: Secondary | ICD-10-CM | POA: Insufficient documentation

## 2015-08-19 DIAGNOSIS — I739 Peripheral vascular disease, unspecified: Secondary | ICD-10-CM | POA: Diagnosis present

## 2015-08-19 HISTORY — PX: PERIPHERAL VASCULAR CATHETERIZATION: SHX172C

## 2015-08-19 LAB — POCT I-STAT, CHEM 8
BUN: 10 mg/dL (ref 6–20)
CALCIUM ION: 1.31 mmol/L — AB (ref 1.13–1.30)
CHLORIDE: 95 mmol/L — AB (ref 101–111)
CREATININE: 0.9 mg/dL (ref 0.44–1.00)
GLUCOSE: 122 mg/dL — AB (ref 65–99)
HCT: 35 % — ABNORMAL LOW (ref 36.0–46.0)
Hemoglobin: 11.9 g/dL — ABNORMAL LOW (ref 12.0–15.0)
Potassium: 4 mmol/L (ref 3.5–5.1)
Sodium: 134 mmol/L — ABNORMAL LOW (ref 135–145)
TCO2: 28 mmol/L (ref 0–100)

## 2015-08-19 SURGERY — LOWER EXTREMITY ANGIOGRAPHY

## 2015-08-19 MED ORDER — FENTANYL CITRATE (PF) 100 MCG/2ML IJ SOLN
INTRAMUSCULAR | Status: DC | PRN
  Administered 2015-08-19: 25 ug via INTRAVENOUS

## 2015-08-19 MED ORDER — HEPARIN (PORCINE) IN NACL 2-0.9 UNIT/ML-% IJ SOLN
INTRAMUSCULAR | Status: AC
  Filled 2015-08-19: qty 1000

## 2015-08-19 MED ORDER — GUAIFENESIN-DM 100-10 MG/5ML PO SYRP: 15.0000 mL | ORAL_SOLUTION | ORAL | Status: DC | PRN

## 2015-08-19 MED ORDER — SODIUM CHLORIDE 0.9 % IV SOLN: 1.0000 mL/kg/h | INTRAVENOUS | Status: DC

## 2015-08-19 MED ORDER — METOPROLOL TARTRATE 1 MG/ML IV SOLN: 2.0000 mg | INTRAVENOUS | Status: DC | PRN

## 2015-08-19 MED ORDER — MIDAZOLAM HCL 2 MG/2ML IJ SOLN
INTRAMUSCULAR | Status: DC | PRN
  Administered 2015-08-19: 1 mg via INTRAVENOUS

## 2015-08-19 MED ORDER — ONDANSETRON HCL 4 MG/2ML IJ SOLN: 4.0000 mg | Freq: Four times a day (QID) | INTRAMUSCULAR | Status: DC | PRN

## 2015-08-19 MED ORDER — PHENOL 1.4 % MT LIQD: 1.0000 | OROMUCOSAL | Status: DC | PRN

## 2015-08-19 MED ORDER — LIDOCAINE HCL (PF) 1 % IJ SOLN
INTRAMUSCULAR | Status: DC | PRN
  Administered 2015-08-19: 15 mL

## 2015-08-19 MED ORDER — LABETALOL HCL 5 MG/ML IV SOLN: 10.0000 mg | INTRAVENOUS | Status: DC | PRN

## 2015-08-19 MED ORDER — MIDAZOLAM HCL 2 MG/2ML IJ SOLN
INTRAMUSCULAR | Status: AC
  Filled 2015-08-19: qty 2

## 2015-08-19 MED ORDER — DOCUSATE SODIUM 100 MG PO CAPS: 100.0000 mg | ORAL_CAPSULE | Freq: Every day | ORAL | Status: DC

## 2015-08-19 MED ORDER — SODIUM CHLORIDE 0.9 % IV SOLN
INTRAVENOUS | Status: DC | PRN
  Administered 2015-08-19: 500 mL via INTRAVENOUS

## 2015-08-19 MED ORDER — OXYCODONE HCL 5 MG PO TABS: 5.0000 mg | ORAL_TABLET | ORAL | Status: DC | PRN

## 2015-08-19 MED ORDER — MORPHINE SULFATE (PF) 10 MG/ML IV SOLN: 2.0000 mg | INTRAVENOUS | Status: DC | PRN

## 2015-08-19 MED ORDER — HEPARIN (PORCINE) IN NACL 2-0.9 UNIT/ML-% IJ SOLN
INTRAMUSCULAR | Status: DC | PRN
  Administered 2015-08-19: 11:00:00

## 2015-08-19 MED ORDER — FENTANYL CITRATE (PF) 100 MCG/2ML IJ SOLN
INTRAMUSCULAR | Status: AC
  Filled 2015-08-19: qty 2

## 2015-08-19 MED ORDER — SODIUM CHLORIDE 0.9 % IV SOLN
INTRAVENOUS | Status: DC
  Administered 2015-08-19: 10:00:00 via INTRAVENOUS

## 2015-08-19 MED ORDER — ACETAMINOPHEN 325 MG RE SUPP: 325.0000 mg | RECTAL | Status: DC | PRN

## 2015-08-19 MED ORDER — HYDRALAZINE HCL 20 MG/ML IJ SOLN: 5.0000 mg | INTRAMUSCULAR | Status: DC | PRN

## 2015-08-19 MED ORDER — IODIXANOL 320 MG/ML IV SOLN
INTRAVENOUS | Status: DC | PRN
  Administered 2015-08-19: 103 mL via INTRA_ARTERIAL

## 2015-08-19 MED ORDER — ALUM & MAG HYDROXIDE-SIMETH 200-200-20 MG/5ML PO SUSP: 15.0000 mL | ORAL | Status: DC | PRN

## 2015-08-19 MED ORDER — ACETAMINOPHEN 325 MG PO TABS: 325.0000 mg | ORAL_TABLET | ORAL | Status: DC | PRN

## 2015-08-19 MED ORDER — LIDOCAINE HCL (PF) 1 % IJ SOLN
INTRAMUSCULAR | Status: AC
  Filled 2015-08-19: qty 30

## 2015-08-19 SURGICAL SUPPLY — 13 items
CATH OMNI FLUSH 5F 65CM (CATHETERS) ×3 IMPLANT
COVER PRB 48X5XTLSCP FOLD TPE (BAG) ×1 IMPLANT
COVER PROBE 5X48 (BAG) ×2
DRAPE ZERO GRAVITY STERILE (DRAPES) ×3 IMPLANT
KIT MICROINTRODUCER STIFF 5F (SHEATH) ×3 IMPLANT
KIT PV (KITS) ×3 IMPLANT
SHEATH PINNACLE 5F 10CM (SHEATH) ×3 IMPLANT
SHIELD RADPAD SCOOP 12X17 (MISCELLANEOUS) ×3 IMPLANT
SYR MEDRAD MARK V 150ML (SYRINGE) ×3 IMPLANT
TRANSDUCER W/STOPCOCK (MISCELLANEOUS) ×3 IMPLANT
TRAY PV CATH (CUSTOM PROCEDURE TRAY) ×3 IMPLANT
TUBING CIL FLEX 10 FLL-RA (TUBING) ×3 IMPLANT
WIRE BENTSON .035X145CM (WIRE) ×3 IMPLANT

## 2015-08-19 NOTE — Interval H&P Note (Signed)
History and Physical Interval Note:  08/19/2015 9:41 AM  Jasmine Fernandez  has presented today for surgery, with the diagnosis of right heal ulcer  The various methods of treatment have been discussed with the patient and family. After consideration of risks, benefits and other options for treatment, the patient has consented to  Procedure(s): Lower Extremity Angiography (N/A) as a surgical intervention .  The patient's history has been reviewed, patient examined, no change in status, stable for surgery.  I have reviewed the patient's chart and labs.  Questions were answered to the patient's satisfaction.     Durene CalBrabham, [place]

## 2015-08-19 NOTE — Progress Notes (Signed)
Called [facility] x [facility], left message to call for a report, receptionist said she would get the message to the nurse.
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
## 2015-08-19 NOTE — Progress Notes (Signed)
  Site area:Left groin a 5 french venous sheath was removed  Site Prior to Removal:  Level 0  Pressure Applied For 15 MINUTES    Minutes Beginning at 1145am  Manual:   Yes.    Patient Status During Pull:  stable  Post Pull Groin Site:  Level 0  Post Pull Instructions Given:  Yes.    Post Pull Pulses Present:  Yes.    Dressing Applied:  Yes.    Comments:  VS remain stable during sheath pull

## 2015-08-19 NOTE — Discharge Instructions (Signed)
Angiogram, Care After °Refer to this sheet in the next few weeks. These instructions provide you with information about caring for yourself after your procedure. Your health care provider may also give you more specific instructions. Your treatment has been planned according to current medical practices, but problems sometimes occur. Call your health care provider if you have any problems or questions after your procedure. °WHAT TO EXPECT AFTER THE PROCEDURE °After your procedure, it is typical to have the following: °· Bruising at the catheter insertion site that usually fades within 1-2 weeks. °· Blood collecting in the tissue (hematoma) that may be painful to the touch. It should usually decrease in size and tenderness within 1-2 weeks. °HOME CARE INSTRUCTIONS °· Take medicines only as directed by your health care provider. °· You may shower 24-48 hours after the procedure or as directed by your health care provider. Remove the bandage (dressing) and gently wash the site with plain soap and water. Pat the area dry with a clean towel. Do not rub the site, because this may cause bleeding. °· Do not take baths, swim, or use a hot tub until your health care provider approves. °· Check your insertion site every day for redness, swelling, or drainage. °· Do not apply powder or lotion to the site. °· Do not lift over 10 lb (4.5 kg) for 5 days after your procedure or as directed by your health care provider. °· Ask your health care provider when it is okay to: °¨ Return to work or school. °¨ Resume usual physical activities or sports. °¨ Resume sexual activity. °· Do not drive home if you are discharged the same day as the procedure. Have someone else drive you. °· You may drive 24 hours after the procedure unless otherwise instructed by your health care provider. °· Do not operate machinery or power tools for 24 hours after the procedure or as directed by your health care provider. °· If your procedure was done as an  outpatient procedure, which means that you went home the same day as your procedure, a responsible adult should be with you for the first 24 hours after you arrive home. °· Keep all follow-up visits as directed by your health care provider. This is important. °SEEK MEDICAL CARE IF: °· You have a fever. °· You have chills. °· You have increased bleeding from the catheter insertion site. Hold pressure on the site.  CALL 911 °SEEK IMMEDIATE MEDICAL CARE IF: °· You have unusual pain at the catheter insertion site. °· You have redness, warmth, or swelling at the catheter insertion site. °· You have drainage (other than a small amount of blood on the dressing) from the catheter insertion site. °· The catheter insertion site is bleeding, and the bleeding does not stop after 30 minutes of holding steady pressure on the site. °· The area near or just beyond the catheter insertion site becomes pale, cool, tingly, or numb. °  °This information is not intended to replace advice given to you by your health care provider. Make sure you discuss any questions you have with your health care provider. °  °Document Released: 03/24/2005 Document Revised: 09/26/2014 Document Reviewed: 02/06/2013 °Elsevier Interactive Patient Education ©2016 [employer] ° °

## 2015-08-19 NOTE — Op Note (Signed)
    Patient name: Jasmine Fernandez MRN: [id number] DOB: Aug 22, 1929 Sex: female  08/19/2015 Pre-operative Diagnosis: right foot ulcer Post-operative diagnosis:  Same Surgeon:  Durene CalBrabham, [place] Procedure Performed:  1.  Ultrasound-guided access, left femoral artery  2.  Abdominal aortogram  3.  Bilateral lower extremity runoff  4.  Second order catheterization     Indications:  .  The patient was seen and evaluated by Dr. Hart RochesterLawson.  She has a right foot ulcer and peripheral vascular disease.She is not a surgical candidate.  Procedure:  The patient was identified in the holding area and taken to room 8.  The patient was then placed supine on the table and prepped and draped in the usual sterile fashion.  A time out was called.  Ultrasound was used to evaluate the left common femoral artery.  It was patent .  A digital ultrasound image was acquired.  A micropuncture needle was used to access the left common femoral artery under ultrasound guidance.  An 018 wire was advanced without resistance and a micropuncture sheath was placed.  The 018 wire was removed and a benson wire was placed.  The micropuncture sheath was exchanged for a 5 french sheath.  An omniflush catheter was advanced over the wire to the level of L-1.  An abdominal angiogram was obtained.  Next, using the omniflush catheter and a benson wire, the aortic bifurcation was crossed and the catheter was placed into theright external iliac artery and right runoff was obtained.  left runoff was performed via retrograde sheath injections.  Findings:   Aortogram:  High-grade left renal artery stenosis.  The infrarenal abdominal aorta is heavily calcified.  No hemodynamically significant aortic or iliac stenosis.  Right Lower Extremity:  The right common femoral artery is calcified.  The profunda femoral artery is patent.  There is a flush occlusion of the superficial femoral artery with reconstitution of the above-knee popliteal artery.   Single-vessel runoff via the peroneal artery.  Left Lower Extremity:  The common femoral profunda femoral artery are patent throughout their course.  There are diffuse stenosis with several areas of total occlusion of the left superficial femoral artery with reconstitution of the popliteal artery.  There appears to be two-vessel runoff through the anterior tibial and peroneal artery, however visualization is limited  Intervention:  none  Impression:  #1  Flush occlusion of the right superficial femoral artery with reconstitution of the above-knee popliteal artery and  Single-vessel runoff via the peroneal artery  #2  Occluded left superficial femoral artery     V. Durene CalWells Abbygael Curtiss, M.D. [facility] [place] Office: [phone] Pager:  [phone]

## 2015-08-20 ENCOUNTER — Encounter (HOSPITAL_COMMUNITY): Payer: Self-pay | Admitting: Surgery

## 2015-08-20 NOTE — Progress Notes (Addendum)
Patient ID: Jasmine Fernandez, female   DOB: July 31, 1929, 81 y.o.   MRN: [id number]                PROGRESS NOTE  DATE:  08/11/2015         [facility]                      LEVEL OF CARE:   SNF   Acute Visit               CHIEF COMPLAINT:  Follow up DVT, chronic arterial occlusion.     HISTORY OF PRESENT ILLNESS:  I am seeing Jasmine Fernandez today in follow-up for predominantly her wounds, but also the plans for angiography on 08/19/2015.    Briefly, this is a patient who came to us after being found down at home and admitted to hospital.  She came here with a substantial infected wound over her left gluteal which has now been successfully debrided.  She has completed antibiotics.  She also has an area over her left scapula which I debrided today (see procedure note below).  She also has a black eschar over her left heel.    The arterial dopplers that I ordered showed complete occlusion of the right superficial femoral artery and poor distal collateralization.  She was also, I think incidentally, discovered to have a right femoral DVT and is now on Lovenox.  I have elected to continue this until after whatever surgical procedure she is going to have, if she indeed is a candidate for one, before changing her to an oral alternative.    CURRENT MEDICATIONS:  Medication list is reviewed.              Pro-Stat 30 mL b.i.d.     Boost 250 b.i.d.      Remeron 15 mg p.o. q.h.s., being given for  a combination of depression and oral intake.    Lozol 25 q.d.     Cozaar 25 q.d.      Tenormin 50 q.d.     Florastor 250 b.i.d.      K-Dur 20 mEq p.o. daily.     Lovenox 50 mg subcu b.i.d.      LABORATORY DATA:   Recent lab work:    BUN and creatinine are within normal limits.    Recent white count was 15.7 on 08/03/2015.  Repeated at 12.3 on 08/10/2015.  Hemoglobin 11.  Differential count essentially normal.    Past Medical History  Diagnosis Date  . Hypertension   . Goiter     . Aortic stenosis   . Rheumatic fever   . Diabetes mellitus without complication [facility](HCC)     patient states she is not diabetic.    Past Surgical History  Procedure Laterality Date  . Tonsillectomy      During Childhood  . Peripheral vascular catheterization N/A 08/19/2015    Procedure: Lower Extremity Angiography;  Surgeon: Nada LibmanVance W Brabham, MD;  [facility];  Service: Cardiovascular;  Laterality: N/A;    REVIEW OF SYSTEMS:    GENERAL:  The patient states she feels very itchy, especially on her back.   HEENT: No oral pain.  CHEST/RESPIRATORY:  No shortness of breath.   CARDIAC:  No chest pain.   GI:  No abdominal pain or diarrhea that I am aware of.   GU: no hamaturia MUSCULOSKELETAL:  Extremities:  She does not complain of pain in her legs.  There  is no clear claudication.   Neurologic: generally weak, no lateralizing complains PSYCHIATRIC:  Mental status:  Staff find her a little brighter on the Remeron.  Skin report stable to improved left gluteal wound, vac and pulse lavage.      PHYSICAL EXAMINATION:   GENERAL APPEARANCE:  The patient is not in any distress.  Much more conversational today than she has been in recent weeks.   CHEST/RESPIRATORY:  Clear air entry bilaterally.    CARDIOVASCULAR:   CARDIAC:  Heart sounds are normal.  There are no murmurs.   She appears to be euvolemic.    GASTROINTESTINAL:   ABDOMEN:  Soft.  No masses.     LIVER/SPLEEN/KIDNEYS:  No liver, no spleen.  No tenderness.    GENITOURINARY:   BLADDER:  No suprapubic or costovertebral angle tenderness.   SKIN:   INSPECTION:  The area over her left buttock continues to gradually improve under a wound VAC, with collagen on the deeper aspects of this wound.  Over her scapula, she underwent an aggressive surgical debridement using a curette.  She tolerated this fairly well.  We removed tight adherent surface eschar and nonviable subcutaneous tissue.  Over the right heel is a black eschar.  I  do not believe this is gangrene.  However, I am not going to attempt debridement until she has had her full vascular work-up including an arteriogram and any relevant subsequent interventions.   NEUROLOGIC: generalized weakness 4/5 but non lateralizing.  MENTAL STATUS: much improved. More engaged, much less depressed  ASSESSMENT/PLAN:                PAD.  Going for an arteriogram on 08/19/2015.  I have written her orders for pre- and post-event hydration to prevent renal toxicity.  Stop Lovenox the day before procedure. Consider xarelto post when results known  DVT: rt leg. Incidentally discovered at time of her arterial dopoplers I believe.   Deep stage III wound over the left gluteal.  There is no option here but to continue the wound VAC with collagen under the foam.  This looks to be gradually improving.    Non-stageable wound over the left scapula.  Again, part of her original presentation.  This was aggressively debrided in the facility today.  We will continue with Santyl based dressings.    Depression with resultant poor oral intake.  I think the depression angle is better, and her intake appears to be improving.    This patient is going to require a prolonged course of rehabilitation. The possibility of independent discharge seemed almost impossible 2 weeks ago however with improvement of her depression things seems slightly more optomistic. Her exact functional status prior to this is still not completely clear  Dysphagia: At one point patient described lower esophageal dysphagia. I had asked  To a barium swallow, I don't see that this was done. I will attempt to discuss again with patient

## 2015-08-25 ENCOUNTER — Non-Acute Institutional Stay (SKILLED_NURSING_FACILITY): Payer: Medicare PPO | Admitting: Internal Medicine

## 2015-08-25 DIAGNOSIS — I739 Peripheral vascular disease, unspecified: Secondary | ICD-10-CM | POA: Diagnosis not present

## 2015-08-25 DIAGNOSIS — L89310 Pressure ulcer of right buttock, unstageable: Secondary | ICD-10-CM

## 2015-08-27 NOTE — Progress Notes (Addendum)
Patient ID: Jasmine Fernandez, female   DOB: Jun 26, 1929, 81 y.o.   MRN: [id number]                PROGRESS NOTE  DATE:  08/25/2015        [facility]            LEVEL OF CARE:   SNF   Acute Visit                 CHIEF COMPLAINT:  Follow up wound issues/DVT.      HISTORY OF PRESENT ILLNESS:  Jasmine Fernandez is a patient whom I saw two weeks ago.  She has gone on to have an arteriogram of the right leg.  I [facility], although the information suggests that she is not in a situation where there is going to be any revascularization as there was the comment, "continue wound care".     At the time of her original arterial dopplers, she was incidentally discovered to have a right femoral DVT and she has been on Lovenox since.  There is really no reason to continue the Lovenox versus any of the alternatives that I am aware of, and I will probably switch her to Xarelto tomorrow.    We continue to make progress, apparently, with the left gluteal wound under a wound VAC and collagen under the foam.    Past Medical History  Diagnosis Date  . Hypertension   . Goiter   . Aortic stenosis   . Rheumatic fever   . Diabetes mellitus without complication [facility])     patient states she is not diabetic.     REVIEW OF SYSTEMS:    GENERAL:  The patient states she is continuing to feel very itchy, especially on her back.   CHEST/RESPIRATORY:  No cough.  No sputum.    CARDIAC:  No chest pain.   GI:  No abdominal pain.  No diarrhea.    PSYCHIATRIC:  Mental status:  Much better on the Remeron.    PHYSICAL EXAMINATION:   General: more allert and responsive Resp: clear a/e bilateral CVS: Hs normal, she appears to be euvolemic. 3/6 sem c/w known aortic stenosis  ABD: no liver no spleen no masses SKIN:   INSPECTION:  The coccyx wound has a nice, beefy red granulating base.   The superior aspect is deeper, although this has always been true.   There is nothing about  this that leads me to believe this is not going to continue to improve.  Over the left scapular area, this continues to look improved, as well.  There is now viable tissue, although there is still some room to benefit from the [place].  Over the right heel, again a very black eschar.  I could not "cross-hatch" this.  I am going to change the dressing to Santyl/hydrogel to soften this before I can attempt to open this.    ASSESSMENT/PLAN:              PAD.  Apparently not revascularizable.    DVT on the right leg.  Discovered incidentally at the time of her arterial dopplers.  I am going to change her from Lovenox to Xarelto, starting tomorrow.    Deep stage III wound over her left gluteal.   Good improvement on the VAC with collagen under the VAC foam.    Nonstageable wound over the left scapula.  This is improving, as well.  Depression with resultant poor oral intake.  I think things are improving.    Severe pruritus.  Her skin has multiple scratch marks.  I am not really certain what is causing this.  There is no real rash, per se.     CPT CODE: [id number]

## 2015-10-04 ENCOUNTER — Encounter (HOSPITAL_COMMUNITY): Payer: Self-pay | Admitting: Emergency Medicine

## 2015-10-04 ENCOUNTER — Emergency Department (HOSPITAL_COMMUNITY)
Admission: EM | Admit: 2015-10-04 | Discharge: 2015-10-04 | Disposition: A | Discharge status: Home / Self Care | Payer: Medicare Other | Attending: Emergency Medicine | Admitting: Emergency Medicine

## 2015-10-04 DIAGNOSIS — L97419 Non-pressure chronic ulcer of right heel and midfoot with unspecified severity: Secondary | ICD-10-CM | POA: Diagnosis not present

## 2015-10-04 DIAGNOSIS — M79671 Pain in right foot: Secondary | ICD-10-CM | POA: Diagnosis present

## 2015-10-04 DIAGNOSIS — Z79899 Other long term (current) drug therapy: Secondary | ICD-10-CM | POA: Insufficient documentation

## 2015-10-04 DIAGNOSIS — E119 Type 2 diabetes mellitus without complications: Secondary | ICD-10-CM | POA: Diagnosis not present

## 2015-10-04 DIAGNOSIS — I10 Essential (primary) hypertension: Secondary | ICD-10-CM | POA: Insufficient documentation

## 2015-10-04 DIAGNOSIS — Z880 Allergy status to penicillin: Secondary | ICD-10-CM | POA: Diagnosis not present

## 2015-10-04 MED ORDER — HYDROCODONE-ACETAMINOPHEN 5-325 MG PO TABS: 1.0000 | ORAL_TABLET | Freq: Four times a day (QID) | ORAL | Status: DC | PRN

## 2015-10-04 MED ORDER — HYDROCODONE-ACETAMINOPHEN 5-325 MG PO TABS
1.0000 | ORAL_TABLET | Freq: Once | ORAL | Status: DC
  Filled 2015-10-04: qty 1

## 2015-10-04 NOTE — Discharge Instructions (Signed)
Schedule a follow up appointment with your primary doctor at [facility]. Take the vicodin if the tylenol does not control your pain.  Wound Care    Taking care of your wound properly can help to prevent pain and infection. It can also help your wound to heal more quickly.  HOW TO CARE FOR YOUR WOUND  Take or apply over-the-counter and prescription medicines only as told by your health care provider.  If you were prescribed antibiotic medicine, take or apply it as told by your health care provider. Do not stop using the antibiotic even if your condition improves.  Clean the wound each day or as told by your health care provider.  Wash the wound with mild soap and water.  Rinse the wound with water to remove all soap.  Pat the wound dry with a clean towel. Do not rub it. There are many different ways to close and cover a wound. For example, a wound can be covered with stitches (sutures), skin glue, or adhesive strips. Follow instructions from your health care provider about:  How to take care of your wound.  When and how you should change your bandage (dressing).  When you should remove your dressing.  Removing whatever was used to close your wound. Check your wound every day for signs of infection. Watch for:  Redness, swelling, or pain.  Fluid, blood, or pus. Keep the dressing dry until your health care provider says it can be removed. Do not take baths, swim, use a hot tub, or do anything that would put your wound underwater until your health care provider approves.  Raise (elevate) the injured area above the level of your heart while you are sitting or lying down.  Do not scratch or pick at the wound.  Keep all follow-up visits as told by your health care provider. This is important. SEEK MEDICAL CARE IF:  You received a tetanus shot and you have swelling, severe pain, redness, or bleeding at the injection site.  You have a fever.  Your pain is not controlled with medicine.  You have  increased redness, swelling, or pain at the site of your wound.  You have fluid, blood, or pus coming from your wound.  You notice a bad smell coming from your wound or your dressing. SEEK IMMEDIATE MEDICAL CARE IF:  You have a red streak going away from your wound. This information is not intended to replace advice given to you by your health care provider. Make sure you discuss any questions you have with your health care provider.  Document Released: 06/14/2008 Document Revised: 01/20/2015 Document Reviewed: 09/01/2014  Elsevier Interactive Patient Education [employer]2016 Elsevier Inc.

## 2015-10-04 NOTE — ED Notes (Signed)
Pt doesn't want any pain meds b/c she says she isn't hurting. She is ready to go back to [facility].

## 2015-10-04 NOTE — ED Notes (Signed)
Pt c/o injury to right foot in October. Pt presents with gauze bandage to right foot. Black wound bed noted to heel area.

## 2015-10-04 NOTE — ED Provider Notes (Signed)
CSN: [id number]     Arrival date & time 10/04/15  1211 History   First MD Initiated Contact with Patient 10/04/15 1238     Chief Complaint  Patient presents with  . Foot Injury   HPI  Ms. Lafalce is an 82 year old female with a past medical history of HTN, DM and chronic pressure ulcers presenting with foot pain. She reports a non-healing ulcer to her right heel that has been present for 3 months. She is unsure how she originally sustained the wound; she states "it just showed up one day". She resides in [facility] after being hospitalized for an unwitnessed fall and was subsequently found to have a severe pressure ulcer of the gluteal cleft, shoulder and heel. She is followed by physicians and wound care nurses at her facility for this. She currently has a wound vac over her gluteal ulcer. She denies change in her foot wound. She states that the pain has worsened over the past few days "and they won't give me anything but tylenol". She has been wheelchair bound since hospitalization. No change in mobility. She was seen by her wound care nurse and had her heel re-wrapped yesterday. She denies fever, chills, dizziness, syncope, nausea, vomiting, myalgias, purulent discharge, erythema extending from the wound, foul odor, inability to move the foot, numbness, weakness or loss of sensation in the foot. Called [facility] and spoke with [place]. She reports pt is seen twice a week for wound care. No recent changes in her wounds and reported a good visit with wound nurse yesterday. She does confirm a PRN tylenol order but no other pain medications. She denies sending pt to ER. Pt had her son in law visiting and instructed him to bring her without informing the facility.   Past Medical History  Diagnosis Date  . Hypertension   . Goiter   . Aortic stenosis   . Rheumatic fever   . Diabetes mellitus without complication [place])     patient states she is not diabetic.    Past Surgical History  Procedure  Laterality Date  . Tonsillectomy      During Childhood  . Peripheral vascular catheterization N/A 08/19/2015    Procedure: Lower Extremity Angiography;  Surgeon: Nada Libman, MD;  Location: [facility] INVASIVE CV LAB;  Service: Cardiovascular;  Laterality: N/A;   Family History  Problem Relation Age of Onset  . Heart disease Father   . Heart attack Father    Social History  Substance Use Topics  . Smoking status: Never Smoker   . Smokeless tobacco: None  . Alcohol Use: No   OB History    No data available     Review of Systems  Skin: Positive for wound.  All other systems reviewed and are negative.     Allergies  Penicillins  Home Medications   Prior to Admission medications   Medication Sig Start Date End Date Taking? Authorizing Provider  acetaminophen (TYLENOL) 325 MG tablet Take 325 mg by mouth every 6 (six) hours as needed for mild pain.   Yes Historical Provider, MD  Amino Acids-Protein Hydrolys (FEEDING SUPPLEMENT, PRO-STAT SUGAR FREE 64,) LIQD Take 30 mLs by mouth 2 (two) times daily.   Yes Historical Provider, MD  feeding supplement (BOOST / RESOURCE BREEZE) LIQD Take 1 Container by mouth 2 (two) times daily between meals.   Yes Historical Provider, MD  indapamide (LOZOL) 1.25 MG tablet Take 1.25 mg by mouth every morning.   Yes Historical Provider, MD  losartan (COZAAR) 25 MG tablet Take 25 mg by mouth daily.   Yes Historical Provider, MD  magnesium hydroxide (MILK OF MAGNESIA) 400 MG/5ML suspension Take 30 mLs by mouth daily as needed for mild constipation.   Yes Historical Provider, MD  mirtazapine (REMERON) 15 MG tablet Take 15 mg by mouth at bedtime.   Yes Historical Provider, MD  Multiple Vitamin (MULTIVITAMIN WITH MINERALS) TABS tablet Take 1 tablet by mouth daily.   Yes Historical Provider, MD  potassium chloride SA (K-DUR,KLOR-CON) 20 MEQ tablet Take 20 mEq by mouth daily.   Yes Historical Provider, MD  promethazine (PHENERGAN) 25 MG tablet Take 25 mg by  mouth every 6 (six) hours as needed for nausea or vomiting.   Yes Historical Provider, MD  rivaroxaban (XARELTO) 20 MG TABS tablet Take 20 mg by mouth daily with supper.   Yes Historical Provider, MD  saccharomyces boulardii (FLORASTOR) 250 MG capsule Take 1 capsule (250 mg total) by mouth 2 (two) times daily. 07/03/15  Yes Marinda ElkAbraham Feliz Ortiz, MD  TENORMIN 50 MG tablet Take 50 mg by mouth daily. 04/06/15  Yes Historical Provider, MD  UNABLE TO FIND Take 1 each by mouth 2 (two) times daily. Med Name: Magic Cup   Yes Historical Provider, MD  HYDROcodone-acetaminophen (NORCO/VICODIN) 5-325 MG tablet Take 1 tablet by mouth every 6 (six) hours as needed. 10/04/15   Marios Gaiser, PA-C   BP 133/49 mmHg  Pulse 52  Temp(Src) 98.3 F (36.8 C) (Oral)  Resp 20  Ht 5' (1.524 m)  Wt 56.331 kg  BMI 24.25 kg/m2  SpO2 99% Physical Exam  Constitutional: She appears well-developed and well-nourished. No distress.  HENT:  Head: Normocephalic and atraumatic.  Eyes: Conjunctivae are normal. Right eye exhibits no discharge. Left eye exhibits no discharge. No scleral icterus.  Neck: Normal range of motion.  Cardiovascular: Normal rate, regular rhythm and intact distal pulses.   Pulmonary/Chest: Effort normal. No respiratory distress.  Musculoskeletal: Normal range of motion.  Wound with blackened eschar noted to right heel. No purulent or serous drainage. No erythema or streaking surround the wound. FROM of the ankle and toes. Pedal pulse palpable. No edema of the ankle or foot. Chart review shows similar presentation of ulcer dating back to October 2016. Wound vac over gluteal ulcer in place. Covered ulcer of left scapula. Pt moves all extremities spontaneously.   Neurological: She is alert. Coordination normal.  Skin: Skin is warm and dry.  Psychiatric: She has a normal mood and affect. Her behavior is normal.  Nursing note and vitals reviewed.   ED Course  Procedures (including critical care time) Labs  Review Labs Reviewed - No data to display  Imaging Review No results found. I have personally reviewed and evaluated these images and lab results as part of my medical decision-making.   EKG Interpretation None      MDM   Final diagnoses:  Ulcer of heel, right, with unspecified severity (HCC)   82 year old female presenting with foot pain. Pt has chronic ulcer to heel of right foot that is being monitored by her physician and wound care nurse at [facility]. Pt denies change in the wound but reports increased pain in the foot. She has been taking tylenol with no relief. D[facility] who confirms PRN tylenol but no other pain medications. She was seen by wound nurse yesterday for foot care and nurse had no concerns about wound at that time. Pt is non-toxic  appearing. Chronic ulcer noted to right heel without signs of infection. This appears to be a pain control issue. Discussed this with [facility] nurse who will schedule an appointment with pt's on site physician to discuss pain management in 2 days. Will provide pain rx for pt until then. Offered pt pain medication in ED which she declined. She stated "I'm not in any pain right now so I just want to go back". Discussed with Dr. Juleen China who has seen and evaluated the pt and agrees with stated plan. Return precautions given in discharge paperwork and discussed with pt at bedside. Pt stable for discharge     Alveta Heimlich, PA-C 10/04/15 1438  Raeford Razor, MD 10/14/15 1323

## 2015-10-05 ENCOUNTER — Other Ambulatory Visit: Payer: Self-pay | Admitting: *Deleted

## 2015-10-05 MED ORDER — HYDROCODONE-ACETAMINOPHEN 5-325 MG PO TABS: ORAL_TABLET | ORAL | Status: DC

## 2015-10-05 NOTE — Telephone Encounter (Signed)
[facility] 

## 2015-11-12 ENCOUNTER — Encounter: Payer: Self-pay | Admitting: Internal Medicine

## 2015-11-12 NOTE — Progress Notes (Signed)
A user error has taken place: encounter opened in error, closed for administrative reasons.

## 2015-11-16 ENCOUNTER — Emergency Department (HOSPITAL_COMMUNITY)
Admission: EM | Admit: 2015-11-16 | Discharge: 2015-11-17 | Disposition: A | Discharge status: Home / Self Care | Payer: Medicare Other | Attending: Emergency Medicine | Admitting: Emergency Medicine

## 2015-11-16 ENCOUNTER — Encounter (HOSPITAL_COMMUNITY): Payer: Self-pay | Admitting: Emergency Medicine

## 2015-11-16 ENCOUNTER — Emergency Department (HOSPITAL_COMMUNITY): Payer: Medicare Other

## 2015-11-16 DIAGNOSIS — M79671 Pain in right foot: Secondary | ICD-10-CM | POA: Diagnosis present

## 2015-11-16 DIAGNOSIS — Z79899 Other long term (current) drug therapy: Secondary | ICD-10-CM | POA: Insufficient documentation

## 2015-11-16 DIAGNOSIS — E119 Type 2 diabetes mellitus without complications: Secondary | ICD-10-CM | POA: Insufficient documentation

## 2015-11-16 DIAGNOSIS — L8990 Pressure ulcer of unspecified site, unspecified stage: Secondary | ICD-10-CM

## 2015-11-16 DIAGNOSIS — I10 Essential (primary) hypertension: Secondary | ICD-10-CM | POA: Diagnosis not present

## 2015-11-16 DIAGNOSIS — Z880 Allergy status to penicillin: Secondary | ICD-10-CM | POA: Diagnosis not present

## 2015-11-16 DIAGNOSIS — L89619 Pressure ulcer of right heel, unspecified stage: Secondary | ICD-10-CM | POA: Insufficient documentation

## 2015-11-16 LAB — CBC WITH DIFFERENTIAL/PLATELET
BASOS ABS: 0.1 10*3/uL (ref 0.0–0.1)
BASOS PCT: 0 %
EOS ABS: 0.4 10*3/uL (ref 0.0–0.7)
EOS PCT: 3 %
HCT: 37.2 % (ref 36.0–46.0)
Hemoglobin: 12.1 g/dL (ref 12.0–15.0)
LYMPHS ABS: 1.1 10*3/uL (ref 0.7–4.0)
Lymphocytes Relative: 9 %
MCH: 28.5 pg (ref 26.0–34.0)
MCHC: 32.5 g/dL (ref 30.0–36.0)
MCV: 87.5 fL (ref 78.0–100.0)
Monocytes Absolute: 0.7 10*3/uL (ref 0.1–1.0)
Monocytes Relative: 6 %
Neutro Abs: 10.4 10*3/uL — ABNORMAL HIGH (ref 1.7–7.7)
Neutrophils Relative %: 82 %
Platelets: 355 10*3/uL (ref 150–400)
RBC: 4.25 MIL/uL (ref 3.87–5.11)
RDW: 12.7 % (ref 11.5–15.5)
WBC: 12.6 10*3/uL — AB (ref 4.0–10.5)

## 2015-11-16 LAB — BASIC METABOLIC PANEL
ANION GAP: 7 (ref 5–15)
BUN: 27 mg/dL — ABNORMAL HIGH (ref 6–20)
CALCIUM: 9.8 mg/dL (ref 8.9–10.3)
CHLORIDE: 99 mmol/L — AB (ref 101–111)
CO2: 28 mmol/L (ref 22–32)
CREATININE: 0.91 mg/dL (ref 0.44–1.00)
GFR calc non Af Amer: 56 mL/min — ABNORMAL LOW (ref >60)
GLUCOSE: 122 mg/dL — AB (ref 65–99)
Potassium: 5 mmol/L (ref 3.5–5.1)
Sodium: 134 mmol/L — ABNORMAL LOW (ref 135–145)

## 2015-11-16 MED ORDER — PERMETHRIN 5 % EX CREA: TOPICAL_CREAM | CUTANEOUS | Status: DC

## 2015-11-16 NOTE — Discharge Instructions (Signed)
No evidence of scabies was found on exam, but if the concern remains please apply the cream as directed.   Pressure Injury A pressure injury, sometimes called a bedsore, is an injury to the skin and underlying tissue caused by pressure. Pressure on blood vessels causes decreased blood flow to the skin, which can eventually cause the skin tissue to die and break down into a wound. Pressure injuries usually occur:  Over bony parts of the body such as the tailbone, shoulders, elbows, hips, and heels.  Under medical devices such as respiratory equipment, stockings, tubes, and splints. Pressure injuries start as reddened areas on the skin and can lead to pain, muscle damage, and infection. Pressure injuries can vary in severity.  CAUSES Pressure injuries are caused by a lack of blood supply to an area of skin. They can occur from intense pressure over a short period of time or from less intense pressure over a long period of time. RISK FACTORS This condition is more likely to develop in people who:  Are in the hospital or an extended care facility.  Are bedridden or in a wheelchair.  Have an injury or disease that keeps them from:  Moving normally.  Feeling pain or pressure.  Have a condition that:  Makes them sleepy or less alert.  Causes poor blood flow.  Need to wear a medical device.  Have poor control of their bladder or bowel functions (incontinence).  Have poor nutrition (malnutrition).  Are of certain ethnicities. People of African American and Latino or Hispanic descent are at higher risk compared to other ethnic groups. If you are at risk for pressure ulcers, your health care provider may recommend certain types of bedding to help prevent them. These may include foam or gel mattresses covered with one of the following:  A sheepskin blanket.  A pad that is filled with gel, air, water, or foam. SYMPTOMS  The main symptom is a blister or change in skin color that opens  into a wound. Other symptoms include:   Red or dark areas of skin that do not turn white or pale when pressed with a finger.   Pain, warmth, or change of skin texture.  DIAGNOSIS This condition is diagnosed with a medical history and physical exam. You may also have tests, including:   Blood tests to check for infection or signs of poor nutrition.  Imaging studies to check for damage to the deep tissues under your skin.  Blood flow studies. Your pressure injury will be staged to determine its severity. Staging is an assessment of:  The depth of the pressure injury.  Which tissues are exposed because of the pressure injury.  The causes of the pressure injury. TREATMENT The main focus of treatment is to help your injury heal. This may be done by:   Relieving or redistributing pressure on your skin. This includes:  Frequently changing your position.  Eliminating or minimizing positions that caused the wound or that can make the wound worse.  Using specific bed mattresses and chair cushions.  Refitting, resizing, or replacing any medical devices, or padding the skin under them.  Using creams or powders to prevent rubbing (friction) on the skin.  Keeping your skin clean and dry. This may include using a skin cleanser or skin protectant as told by your health care provider. This may be a lotion, ointment, or spray.  Cleaning your injury and removing any dead tissue from the wound (debridement).  Placing a bandage (dressing) over your  injury.  Preventing or treating infection. This may include antibiotic, antimicrobial, or antiseptic medicines. Treatment may also include medicine for pain. Sometimes surgery is needed to close the wound with a flap of healthy skin or a piece of skin from another area of your body (graft). You may need surgery if other treatments are not working or if your injury is very deep. HOME CARE INSTRUCTIONS Wound Care  Follow instructions from your  health care provider about:  How to take care of your wound.  When and how you should change your dressing.  When you should remove your dressing. If your dressing is dry and stuck when you try to remove it, moisten or wet the dressing with saline or water so that it can be removed without harming your skin or wound tissue.  Check your wound every day for signs of infection. Have a caregiver do this for you if you are not able. Watch for:  More redness, swelling, or pain.  More fluid, blood, or pus.  A bad smell. Skin Care  Keep your skin clean and dry. Gently pat your skin dry.  Do not rub or massage your skin.  Use a skin protectant only as told by your health care provider.  Check your skin every day for any changes in color or any new blisters or sores (ulcers). Have a caregiver do this for you if you are not able. Medicines  Take over-the-counter and prescription medicines only as told by your health care provider.  If you were prescribed an antibiotic medicine, take it or apply it as told by your health care provider. Do not stop taking or using the antibiotic even if your condition improves. Reducing and Redistributing Pressure  Do not lie or sit in one position for a long time. Move or change position every two hours or as told by your health care provider.  Use pillows or cushions to reduce pressure. Ask your health care provider to recommend cushions or pads for you.  Use medical devices that do not rub your skin. Tell your health care provider if one of your medical devices is causing a pressure injury to develop. General Instructions  Eat a healthy diet that includes lots of protein. Ask your health care provider for diet advice.  Drink enough fluid to keep your urine clear or pale yellow.  Be as active as you can every day. Ask your health care provider to suggest safe exercises or activities.  Do not abuse drugs or alcohol.  Keep all follow-up visits as  told by your health care provider. This is important.  Do not smoke. SEEK MEDICAL CARE IF:  You have chills or fever.  Your pain medicine is not helping.  You have any changes in skin color.  You have new blisters or sores.  You develop warmth, redness, or swelling near a pressure injury.  You have a bad odor or pus coming from your pressure injury.  You lose control of your bowels or bladder.  You develop new symptoms.  Your wound does not improve after 1-2 weeks of treatment.  You develop a new medical condition, such as diabetes, peripheral vascular disease, or conditions that affect your defense (immune) system.   This information is not intended to replace advice given to you by your health care provider. Make sure you discuss any questions you have with your health care provider.   Document Released: 09/05/2005 Document Revised: 05/27/2015 Document Reviewed: 01/14/2015 Elsevier Interactive Patient Education 2016 Elsevier  Inc.

## 2015-11-16 NOTE — ED Notes (Addendum)
Pt from [facility], staff wants to rule out scabies and pt c/o right foot/heal pain. Pt was in rehab for foot and states its not healing fast enough, so she wants to get it evaluated.

## 2015-11-16 NOTE — ED Provider Notes (Signed)
CSN: [id number]     Arrival date & time 11/16/15  1841 History   First MD Initiated Contact with Patient 11/16/15 1906     Chief Complaint  Patient presents with  . Foot Pain  . possible scabies      (Consider location/radiation/quality/duration/timing/severity/associated sxs/prior Treatment) HPI Comments: Patient presents to the emergency department with chief complaint of right heel pain. She states that she has a chronic ulcer on her right heel. She states that she "just wanted to get it checked." She is staying at [facility]. She states that she has not seen a physician in the past 2 months, so she requested to come to the emergency department for evaluation. Per nursing staff, [facility] also like the patient checked for scabies. Patient denies any rash or itching. She denies any fevers or chills. She denies any increased pain or new injuries.  The history is provided by the patient. No language interpreter was used.    Past Medical History  Diagnosis Date  . Hypertension   . Goiter   . Aortic stenosis   . Rheumatic fever   . Diabetes mellitus without complication [facility])     patient states she is not diabetic.    Past Surgical History  Procedure Laterality Date  . Tonsillectomy      During Childhood  . Peripheral vascular catheterization N/A 08/19/2015    Procedure: Lower Extremity Angiography;  Surgeon: Nada Libman, MD;  Location: [facility] INVASIVE CV LAB;  Service: Cardiovascular;  Laterality: N/A;   Family History  Problem Relation Age of Onset  . Heart disease Father   . Heart attack Father    Social History  Substance Use Topics  . Smoking status: Never Smoker   . Smokeless tobacco: None  . Alcohol Use: No   OB History    No data available     Review of Systems  All other systems reviewed and are negative.     Allergies  Penicillins  Home Medications   Prior to Admission medications   Medication Sig Start Date End Date Taking? Authorizing  Provider  acetaminophen (TYLENOL) 325 MG tablet Take 325 mg by mouth every 6 (six) hours as needed for mild pain.    Historical Provider, MD  alum & mag hydroxide-simeth (MAALOX PLUS) 400-400-40 MG/5ML suspension 30cc by mouth every 4 hours as needed for indigestion    Historical Provider, MD  Amino Acids-Protein Hydrolys (FEEDING SUPPLEMENT, PRO-STAT SUGAR FREE 64,) LIQD Take 30 mLs by mouth 2 (two) times daily.    Historical Provider, MD  diphenhydrAMINE (BENADRYL) 25 mg capsule Take one tablet by mouth every 6 hours as needed for itching    Historical Provider, MD  feeding supplement (BOOST / RESOURCE BREEZE) LIQD Take 1 Container by mouth 2 (two) times daily between meals.    Historical Provider, MD  HYDROcodone-acetaminophen (NORCO/VICODIN) 5-325 MG tablet Take one tablet by mouth every 6 hours as needed for pain. Do not exceed 4gm of Tylenol in 24 hours 10/05/15   Tiffany L Reed, DO  indapamide (LOZOL) 1.25 MG tablet Take 1.25 mg by mouth every morning.    Historical Provider, MD  losartan (COZAAR) 25 MG tablet Take 25 mg by mouth daily.    Historical Provider, MD  mirtazapine (REMERON) 15 MG tablet Take 15 mg by mouth at bedtime.    Historical Provider, MD  Multiple Vitamin (MULTIVITAMIN WITH MINERALS) TABS tablet Take 1 tablet by mouth daily.    Historical Provider, MD  potassium chloride  SA (K-DUR,KLOR-CON) 20 MEQ tablet Take 20 mEq by mouth daily.    Historical Provider, MD  promethazine (PHENERGAN) 25 MG suppository Place 25 mg rectally every 6 (six) hours as needed for nausea or vomiting (as needed for nausea and vomiting).    Historical Provider, MD  promethazine (PHENERGAN) 25 MG tablet Take 25 mg by mouth every 6 (six) hours as needed for nausea or vomiting.    Historical Provider, MD  rivaroxaban (XARELTO) 20 MG TABS tablet Take 20 mg by mouth daily with supper.    Historical Provider, MD  saccharomyces boulardii (FLORASTOR) 250 MG capsule Take 1 capsule (250 mg total) by mouth 2 (two)  times daily. 07/03/15   Marinda Elk, MD  TENORMIN 50 MG tablet Take 50 mg by mouth daily. 04/06/15   Historical Provider, MD   BP 152/41 mmHg  Pulse 57  Temp(Src) 98.2 F (36.8 C) (Oral)  Resp 22  SpO2 97% Physical Exam  Constitutional: She is oriented to person, place, and time. She appears well-developed and well-nourished.  HENT:  Head: Normocephalic and atraumatic.  Eyes: Conjunctivae and EOM are normal. Pupils are equal, round, and reactive to light.  Neck: Normal range of motion. Neck supple.  Cardiovascular: Normal rate and regular rhythm.  Exam reveals no gallop and no friction rub.   No murmur heard. Pulmonary/Chest: Effort normal and breath sounds normal. No respiratory distress. She has no wheezes. She has no rales. She exhibits no tenderness.  Abdominal: Soft. Bowel sounds are normal. She exhibits no distension and no mass. There is no tenderness. There is no rebound and no guarding.  Musculoskeletal: Normal range of motion. She exhibits no edema or tenderness.  Neurological: She is alert and oriented to person, place, and time.  Skin: Skin is warm and dry.  Right heel pressure ulcer, no sign of cellulitis, no discharge or drainage No rashes  Psychiatric: She has a normal mood and affect. Her behavior is normal. Judgment and thought content normal.  Nursing note and vitals reviewed.   ED Course  Procedures (including critical care time) Results for orders placed or performed during the hospital encounter of 11/16/15  CBC with Differential/Platelet  Result Value Ref Range   WBC 12.6 (H) 4.0 - 10.5 K/uL   RBC 4.25 3.87 - 5.11 MIL/uL   Hemoglobin 12.1 12.0 - 15.0 g/dL   HCT 16.1 09.6 - 04.5 %   MCV 87.5 78.0 - 100.0 fL   MCH 28.5 26.0 - 34.0 pg   MCHC 32.5 30.0 - 36.0 g/dL   RDW 40.9 81.1 - 91.4 %   Platelets 355 150 - 400 K/uL   Neutrophils Relative % 82 %   Neutro Abs 10.4 (H) 1.7 - 7.7 K/uL   Lymphocytes Relative 9 %   Lymphs Abs 1.1 0.7 - 4.0 K/uL    Monocytes Relative 6 %   Monocytes Absolute 0.7 0.1 - 1.0 K/uL   Eosinophils Relative 3 %   Eosinophils Absolute 0.4 0.0 - 0.7 K/uL   Basophils Relative 0 %   Basophils Absolute 0.1 0.0 - 0.1 K/uL  Basic metabolic panel  Result Value Ref Range   Sodium 134 (L) 135 - 145 mmol/L   Potassium 5.0 3.5 - 5.1 mmol/L   Chloride 99 (L) 101 - 111 mmol/L   CO2 28 22 - 32 mmol/L   Glucose, Bld 122 (H) 65 - 99 mg/dL   BUN 27 (H) 6 - 20 mg/dL   Creatinine, Ser 7.82 0.44 - 1.00 mg/dL  Calcium 9.8 8.9 - 10.3 mg/dL   GFR calc non Af Amer 56 (L) >60 mL/min   GFR calc Af Amer >60 >60 mL/min   Anion gap 7 5 - 15   Dg Ankle Complete Right  11/16/2015  CLINICAL DATA:  Painful open wound on the right heel since a fall in October 2016. EXAM: RIGHT ANKLE - COMPLETE 3+ VIEW COMPARISON:  None. FINDINGS: There are minimal degenerative changes of the ankle joint. Small plantar calcaneal spur. Soft tissue ulceration at the posterior aspect of the heel. No evidence of osteomyelitis. No visible foreign body in the soft tissues. IMPRESSION: No acute osseous abnormality.  Soft tissue ulceration Electronically Signed   By: Francene Boyers M.D.   On: 11/16/2015 19:33    I have personally reviewed and evaluated these images and lab results as part of my medical decision-making.    MDM   Final diagnoses:  Pressure ulcer    Patient with pressure ulcer on right heel. This does not appear acutely infected. There is no discharge drainage. No surrounding erythema or sign of cellulitis.  Nursing home also wanted patient treated or evaluated for scabies. I do not see anything that looks like scabies, but if concern remains, do not think would be harmful to give the patient permethrin.  Patient seen by and discussed with Dr. Donnald Garre, who agrees with the plan.    Roxy Horseman, PA-C 11/16/15 2308  Arby Barrette, MD 11/18/15 2047

## 2015-11-16 NOTE — Progress Notes (Signed)
[facility] received consult from EDPA.  Per EDPA patient reports she has not been seen by a doctor in three months.  EDCM called [facility] and spoke to RN April who reports patient is on a skilled unit.  Patient received physical therapy and patient receives daily wound care to her right heel.  RN reports patient's heel is cleansed, santyl ointment applied and bandaged applied.  April reports the MD is on vacation but patient has been seen by Synetta Fail May CMA at the nursing facility today.  Discussed patient with EDPA.  No further EDCM needs at this time.

## 2015-12-07 ENCOUNTER — Encounter (HOSPITAL_BASED_OUTPATIENT_CLINIC_OR_DEPARTMENT_OTHER): Payer: Medicare PPO | Attending: Internal Medicine

## 2015-12-17 ENCOUNTER — Other Ambulatory Visit (HOSPITAL_COMMUNITY): Payer: Self-pay | Admitting: Foot & Ankle Surgery

## 2015-12-17 DIAGNOSIS — I739 Peripheral vascular disease, unspecified: Secondary | ICD-10-CM

## 2015-12-18 ENCOUNTER — Encounter: Payer: Self-pay | Admitting: Internal Medicine

## 2015-12-18 ENCOUNTER — Non-Acute Institutional Stay (SKILLED_NURSING_FACILITY): Payer: Medicare Other | Admitting: Internal Medicine

## 2015-12-18 DIAGNOSIS — L8990 Pressure ulcer of unspecified site, unspecified stage: Secondary | ICD-10-CM | POA: Diagnosis not present

## 2015-12-18 NOTE — Progress Notes (Signed)
Patient ID: Jasmine Fernandez, female   DOB: July 24, 1929, 82 y.o.   MRN: [id number]  Location:  [facility] Room Number: 401A Place of Service:  SNF (31) Provider: Kimber Relic, MD  Patient Care Team: Kimber Relic, MD as PCP - General (Internal Medicine)  Extended Emergency Contact Information Primary Emergency Contact: Harlan Stains [place] Home Phone: [phone] Relation: Son Secondary Emergency Contact: Ardeen Jourdain [address] [place]: [phone] Relation: Daughter  Code Status:  full Goals of care: Advanced Directive information Advanced Directives 12/18/2015  Does patient have an advance directive? No  Would patient like information on creating an advanced directive? No - patient declined information     Chief Complaint  Patient presents with  . Acute Visit    right heel wound, patient refuses medications    HPI:  Pt is a 82 y.o. female seen today for an acute visit for Evaluation of right heel wound that has a large eschar. Patient was recently seen by a podiatrist,N'tuma, Jah, DPM at [facility] [place]. [address] [place] [place], [place] [address]. He recommended that she get vascular studies at [facility] heart and vascular, change bandages daily, continue with applications of Santyl. There was concern about infection. Prescription was written for doxycycline 100 mg twice a day for 14 days.  Patient states there is some pain at the ulceration with eschar at the right heel. She is afebrile.  Staff had additional concerns. Sometimes she refuses medications. Patient would like medications reduced if possible. Patient has been spending more time away from the facility. Patient states this is because her daughter is ill and she goes to sit with her until her husband returns from work. Apparently, she has required hospitalization. Staff was worried that  they were unable to treat the patient if she refused medications will was absent from the facility. Patient continues to need assistance with bandage changes daily. There may be mild short-term memory losses as well.   Past Medical History  Diagnosis Date  . Hypertension   . Goiter   . Aortic stenosis   . Rheumatic fever   . Diabetes mellitus without complication [facility])     patient states she is not diabetic.    Past Surgical History  Procedure Laterality Date  . Tonsillectomy      During Childhood  . Peripheral vascular catheterization N/A 08/19/2015    Procedure: Lower Extremity Angiography;  Surgeon: Nada Libman, MD;  Location: [facility] INVASIVE CV LAB;  Service: Cardiovascular;  Laterality: N/A;    Allergies  Allergen Reactions  . Penicillins Swelling    Has patient had a PCN reaction causing immediate rash, facial/tongue/throat swelling, SOB or lightheadedness with hypotension: YES Has patient had a PCN reaction causing severe rash involving mucus membranes or skin necrosis:no Has patient had a PCN reaction that required hospitalization: no Has patient had a PCN reaction occurring within the last 10 years: No If all of the above answers are "NO", then may proceed with Cephalosporin use.       Medication List       This list is accurate as of: 12/18/15  1:26 PM.  Always use your most recent med list.               acetaminophen 325 MG tablet  Commonly known as:  TYLENOL  Take 325 mg by mouth every 4 (  four) hours as needed for mild pain.     alum & mag hydroxide-simeth 400-400-40 MG/5ML suspension  Commonly known as:  MAALOX PLUS  Take 30 mLs by mouth every 6 (six) hours as needed for indigestion.     diphenhydrAMINE 25 mg capsule  Commonly known as:  BENADRYL  Take 25 mg by mouth every 6 (six) hours as needed for itching.     feeding supplement Liqd  Take 1 Container by mouth 2 (two) times daily between meals.     HYDROcodone-acetaminophen 5-325 MG tablet    Commonly known as:  NORCO/VICODIN  Take one tablet by mouth every 6 hours as needed for pain. Do not exceed 4gm of Tylenol in 24 hours     indapamide 1.25 MG tablet  Commonly known as:  LOZOL  Take 1.25 mg by mouth every morning.     losartan 25 MG tablet  Commonly known as:  COZAAR  Take 25 mg by mouth daily.     mirtazapine 15 MG tablet  Commonly known as:  REMERON  Take 15 mg by mouth at bedtime.     multivitamin with minerals Tabs tablet  Take 1 tablet by mouth daily.     permethrin 5 % cream  Commonly known as:  ELIMITE  Apply to entire body other than face - let sit for 14 hours then wash off, may repeat in 1 week if still having symptoms     potassium chloride SA 20 MEQ tablet  Commonly known as:  K-DUR,KLOR-CON  Take 20 mEq by mouth daily.     promethazine 25 MG suppository  Commonly known as:  PHENERGAN  Place 25 mg rectally every 6 (six) hours as needed for nausea or vomiting (as needed for nausea and vomiting).     rivaroxaban 20 MG Tabs tablet  Commonly known as:  XARELTO  Take 20 mg by mouth daily with breakfast.     saccharomyces boulardii 250 MG capsule  Commonly known as:  FLORASTOR  Take 1 capsule (250 mg total) by mouth 2 (two) times daily.     TENORMIN 50 MG tablet  Generic drug:  atenolol  Take 50 mg by mouth daily.        Review of Systems  Constitutional: Negative for fever, chills, diaphoresis, activity change, appetite change, fatigue and unexpected weight change.  HENT: Negative for congestion, ear discharge, ear pain, hearing loss, postnasal drip, rhinorrhea, sore throat, tinnitus, trouble swallowing and voice change.   Eyes: Negative for pain, redness, itching and visual disturbance.  Respiratory: Negative for cough, choking, shortness of breath and wheezing.   Cardiovascular: Negative for chest pain, palpitations and leg swelling.       History DVT  Gastrointestinal: Negative for nausea, abdominal pain, diarrhea, constipation and  abdominal distention.       History of dysphagia  Endocrine: Negative for cold intolerance, heat intolerance, polydipsia, polyphagia and polyuria.  Genitourinary: Negative for dysuria, urgency, frequency, hematuria, flank pain, vaginal discharge, difficulty urinating and pelvic pain.  Musculoskeletal: Negative for myalgias, back pain, arthralgias, gait problem, neck pain and neck stiffness.  Skin: Negative for color change, pallor and rash.       Painful ulceration right heel with eschar  Allergic/Immunologic: Negative.   Neurological: Negative for dizziness, tremors, seizures, syncope, weakness, numbness and headaches.  Hematological: Negative for adenopathy. Does not bruise/bleed easily.  Psychiatric/Behavioral: Negative for suicidal ideas, hallucinations, behavioral problems, confusion, sleep disturbance, dysphoric mood and agitation. The patient is not nervous/anxious and is not  hyperactive.     Immunization History  Administered Date(s) Administered  . Influenza,inj,Quad PF,36+ Mos 06/30/2015   Pertinent  Health Maintenance Due  Topic Date Due  . FOOT EXAM  03/16/1941  . OPHTHALMOLOGY EXAM  03/16/1941  . DEXA SCAN  03/16/1996  . PNA vac Low Risk Adult (1 of 2 - PCV13) 03/16/1996  . HEMOGLOBIN A1C  12/29/2015  . INFLUENZA VACCINE  04/19/2016   No flowsheet data found. Functional Status Survey:    Filed Vitals:   12/18/15 1312  BP: 128/58  Pulse: 70  Temp: 97 F (36.1 C)  Resp: 20  Height: 5' (1.524 m)  Weight: 124 lb (56.246 kg)   Body mass index is 24.22 kg/(m^2). Physical Exam  Constitutional: She is oriented to person, place, and time. She appears well-developed and well-nourished. No distress.  HENT:  Right Ear: External ear normal.  Left Ear: External ear normal.  Nose: Nose normal.  Mouth/Throat: Oropharynx is clear and moist. No oropharyngeal exudate.  Eyes: Conjunctivae and EOM are normal. Pupils are equal, round, and reactive to light. No scleral icterus.    Neck: No JVD present. No tracheal deviation present. No thyromegaly present.  Cardiovascular: Normal rate, regular rhythm, normal heart sounds and intact distal pulses.  Exam reveals no gallop and no friction rub.   No murmur heard. Pulmonary/Chest: Effort normal. No respiratory distress. She has no wheezes. She has no rales. She exhibits no tenderness.  Abdominal: She exhibits no distension and no mass. There is no tenderness.  Musculoskeletal: Normal range of motion. She exhibits no edema or tenderness.  Lymphadenopathy:    She has no cervical adenopathy.  Neurological: She is alert and oriented to person, place, and time. No cranial nerve deficit. Coordination normal.  Skin: No rash noted. She is not diaphoretic. No erythema. No pallor.  Large eschar of the right heel with some separation at the margins. Patient currently on sagittal daily.  Psychiatric: She has a normal mood and affect. Her behavior is normal. Judgment and thought content normal.    Labs reviewed:  Recent Labs  06/30/15 0600 07/01/15 0548 08/06/15 0507 08/19/15 0949 11/16/15 1916  NA 148* 140 136 134* 134*  K 3.4* 3.7 3.9 4.0 5.0  CL 112* 106 98* 95* 99*  CO2 29 28  --   --  28  GLUCOSE 180* 132* 105* 122* 122*  BUN 60* 36* 23* 10 27*  CREATININE 1.05* 0.94 1.00 0.90 0.91  CALCIUM 9.1 8.9  --   --  9.8  MG 2.5*  --   --   --   --     Recent Labs  06/22/15 1610 06/29/15 1739 06/30/15 0600  AST 22 61* 41  ALT 12* 32 23  ALKPHOS 74 91 70  BILITOT 0.7 1.1 1.1  PROT 7.0 8.0 5.9*  ALBUMIN 3.6 3.5 2.6*    Recent Labs  06/30/15 0600 07/01/15 0548 08/06/15 0456 08/06/15 0507 08/19/15 0949 11/16/15 1916  WBC 24.7* 19.7* 15.8*  --   --  12.6*  NEUTROABS 22.1*  --  12.2*  --   --  10.4*  HGB 13.9 12.4 12.5 12.9 11.9* 12.1  HCT 42.3 38.2 38.0 38.0 35.0* 37.2  MCV 89.4 90.1 87.0  --   --  87.5  PLT 227 195 312  --   --  355   Lab Results  Component Value Date   TSH 0.873 06/30/2015   Lab  Results  Component Value Date   HGBA1C 6.3* 06/30/2015  Assessment/Plan 1. Pressure ulcer Continue Santyl Start doxycycline 100 mg twice a day 14 days Continue daily dressing changes

## 2015-12-24 ENCOUNTER — Inpatient Hospital Stay (HOSPITAL_COMMUNITY): Admission: RE | Admit: 2015-12-24 | Payer: Medicare PPO | Source: Ambulatory Visit

## 2015-12-30 ENCOUNTER — Encounter: Payer: Self-pay | Admitting: Nurse Practitioner

## 2015-12-30 NOTE — Progress Notes (Signed)
This encounter was created in error - please disregard.

## 2016-01-11 ENCOUNTER — Encounter (HOSPITAL_COMMUNITY): Payer: Self-pay | Admitting: Foot & Ankle Surgery

## 2016-01-15 ENCOUNTER — Inpatient Hospital Stay (HOSPITAL_COMMUNITY)
Admission: EM | Admit: 2016-01-15 | Discharge: 2016-01-21 | DRG: 300 | Disposition: A | Discharge status: Home Health | Payer: Medicare Other | Attending: Family Medicine | Admitting: Family Medicine

## 2016-01-15 ENCOUNTER — Emergency Department (HOSPITAL_COMMUNITY): Payer: Medicare Other

## 2016-01-15 ENCOUNTER — Encounter (HOSPITAL_COMMUNITY): Payer: Self-pay | Admitting: *Deleted

## 2016-01-15 DIAGNOSIS — E11621 Type 2 diabetes mellitus with foot ulcer: Secondary | ICD-10-CM | POA: Diagnosis present

## 2016-01-15 DIAGNOSIS — Z6824 Body mass index (BMI) 24.0-24.9, adult: Secondary | ICD-10-CM | POA: Diagnosis not present

## 2016-01-15 DIAGNOSIS — E440 Moderate protein-calorie malnutrition: Secondary | ICD-10-CM | POA: Diagnosis present

## 2016-01-15 DIAGNOSIS — Z9114 Patient's other noncompliance with medication regimen: Secondary | ICD-10-CM | POA: Diagnosis not present

## 2016-01-15 DIAGNOSIS — Z7901 Long term (current) use of anticoagulants: Secondary | ICD-10-CM

## 2016-01-15 DIAGNOSIS — E1165 Type 2 diabetes mellitus with hyperglycemia: Secondary | ICD-10-CM | POA: Diagnosis present

## 2016-01-15 DIAGNOSIS — E049 Nontoxic goiter, unspecified: Secondary | ICD-10-CM | POA: Diagnosis present

## 2016-01-15 DIAGNOSIS — E11622 Type 2 diabetes mellitus with other skin ulcer: Secondary | ICD-10-CM | POA: Diagnosis present

## 2016-01-15 DIAGNOSIS — E876 Hypokalemia: Secondary | ICD-10-CM | POA: Diagnosis present

## 2016-01-15 DIAGNOSIS — K219 Gastro-esophageal reflux disease without esophagitis: Secondary | ICD-10-CM | POA: Diagnosis present

## 2016-01-15 DIAGNOSIS — I350 Nonrheumatic aortic (valve) stenosis: Secondary | ICD-10-CM | POA: Diagnosis not present

## 2016-01-15 DIAGNOSIS — L97509 Non-pressure chronic ulcer of other part of unspecified foot with unspecified severity: Secondary | ICD-10-CM | POA: Diagnosis present

## 2016-01-15 DIAGNOSIS — I7092 Chronic total occlusion of artery of the extremities: Secondary | ICD-10-CM | POA: Diagnosis present

## 2016-01-15 DIAGNOSIS — I10 Essential (primary) hypertension: Secondary | ICD-10-CM | POA: Diagnosis present

## 2016-01-15 DIAGNOSIS — Z8249 Family history of ischemic heart disease and other diseases of the circulatory system: Secondary | ICD-10-CM | POA: Diagnosis not present

## 2016-01-15 DIAGNOSIS — Z79899 Other long term (current) drug therapy: Secondary | ICD-10-CM | POA: Diagnosis not present

## 2016-01-15 DIAGNOSIS — L97419 Non-pressure chronic ulcer of right heel and midfoot with unspecified severity: Secondary | ICD-10-CM | POA: Diagnosis present

## 2016-01-15 DIAGNOSIS — I82411 Acute embolism and thrombosis of right femoral vein: Secondary | ICD-10-CM | POA: Diagnosis present

## 2016-01-15 DIAGNOSIS — E1151 Type 2 diabetes mellitus with diabetic peripheral angiopathy without gangrene: Secondary | ICD-10-CM | POA: Diagnosis present

## 2016-01-15 DIAGNOSIS — Z86718 Personal history of other venous thrombosis and embolism: Secondary | ICD-10-CM

## 2016-01-15 DIAGNOSIS — I00 Rheumatic fever without heart involvement: Secondary | ICD-10-CM | POA: Diagnosis present

## 2016-01-15 DIAGNOSIS — Z9119 Patient's noncompliance with other medical treatment and regimen: Secondary | ICD-10-CM | POA: Diagnosis not present

## 2016-01-15 DIAGNOSIS — I110 Hypertensive heart disease with heart failure: Secondary | ICD-10-CM | POA: Diagnosis present

## 2016-01-15 DIAGNOSIS — F341 Dysthymic disorder: Secondary | ICD-10-CM | POA: Diagnosis present

## 2016-01-15 DIAGNOSIS — L97503 Non-pressure chronic ulcer of other part of unspecified foot with necrosis of muscle: Secondary | ICD-10-CM | POA: Diagnosis not present

## 2016-01-15 DIAGNOSIS — L03115 Cellulitis of right lower limb: Secondary | ICD-10-CM | POA: Diagnosis present

## 2016-01-15 DIAGNOSIS — I5032 Chronic diastolic (congestive) heart failure: Secondary | ICD-10-CM | POA: Diagnosis present

## 2016-01-15 DIAGNOSIS — I739 Peripheral vascular disease, unspecified: Secondary | ICD-10-CM | POA: Diagnosis present

## 2016-01-15 DIAGNOSIS — M79671 Pain in right foot: Secondary | ICD-10-CM | POA: Diagnosis present

## 2016-01-15 DIAGNOSIS — Z880 Allergy status to penicillin: Secondary | ICD-10-CM

## 2016-01-15 DIAGNOSIS — F0390 Unspecified dementia without behavioral disturbance: Secondary | ICD-10-CM | POA: Diagnosis present

## 2016-01-15 DIAGNOSIS — E119 Type 2 diabetes mellitus without complications: Secondary | ICD-10-CM

## 2016-01-15 DIAGNOSIS — I050 Rheumatic mitral stenosis: Secondary | ICD-10-CM | POA: Diagnosis not present

## 2016-01-15 DIAGNOSIS — L98499 Non-pressure chronic ulcer of skin of other sites with unspecified severity: Secondary | ICD-10-CM

## 2016-01-15 DIAGNOSIS — L97519 Non-pressure chronic ulcer of other part of right foot with unspecified severity: Secondary | ICD-10-CM | POA: Diagnosis not present

## 2016-01-15 DIAGNOSIS — M790 Rheumatism, unspecified: Secondary | ICD-10-CM | POA: Diagnosis present

## 2016-01-15 DIAGNOSIS — L0390 Cellulitis, unspecified: Secondary | ICD-10-CM | POA: Diagnosis present

## 2016-01-15 DIAGNOSIS — L97412 Non-pressure chronic ulcer of right heel and midfoot with fat layer exposed: Secondary | ICD-10-CM | POA: Diagnosis not present

## 2016-01-15 LAB — CBC WITH DIFFERENTIAL/PLATELET
BASOS ABS: 0 10*3/uL (ref 0.0–0.1)
BASOS PCT: 0 %
Eosinophils Absolute: 0.3 10*3/uL (ref 0.0–0.7)
Eosinophils Relative: 3 %
HEMATOCRIT: 33.7 % — AB (ref 36.0–46.0)
HEMOGLOBIN: 11.3 g/dL — AB (ref 12.0–15.0)
Lymphocytes Relative: 12 %
Lymphs Abs: 1.4 10*3/uL (ref 0.7–4.0)
MCH: 28.7 pg (ref 26.0–34.0)
MCHC: 33.5 g/dL (ref 30.0–36.0)
MCV: 85.5 fL (ref 78.0–100.0)
MONOS PCT: 8 %
Monocytes Absolute: 0.9 10*3/uL (ref 0.1–1.0)
NEUTROS ABS: 9.5 10*3/uL — AB (ref 1.7–7.7)
NEUTROS PCT: 77 %
Platelets: 237 10*3/uL (ref 150–400)
RBC: 3.94 MIL/uL (ref 3.87–5.11)
RDW: 13.6 % (ref 11.5–15.5)
WBC: 12.2 10*3/uL — ABNORMAL HIGH (ref 4.0–10.5)

## 2016-01-15 LAB — COMPREHENSIVE METABOLIC PANEL
ALBUMIN: 3.8 g/dL (ref 3.5–5.0)
ALT: 14 U/L (ref 14–54)
ANION GAP: 10 (ref 5–15)
AST: 22 U/L (ref 15–41)
Alkaline Phosphatase: 67 U/L (ref 38–126)
BILIRUBIN TOTAL: 0.4 mg/dL (ref 0.3–1.2)
BUN: 12 mg/dL (ref 6–20)
CHLORIDE: 105 mmol/L (ref 101–111)
CO2: 27 mmol/L (ref 22–32)
Calcium: 9.6 mg/dL (ref 8.9–10.3)
Creatinine, Ser: 0.81 mg/dL (ref 0.44–1.00)
GFR calc Af Amer: >60 mL/min (ref >60)
GFR calc non Af Amer: >60 mL/min (ref >60)
GLUCOSE: 135 mg/dL — AB (ref 65–99)
POTASSIUM: 3.3 mmol/L — AB (ref 3.5–5.1)
Sodium: 142 mmol/L (ref 135–145)
TOTAL PROTEIN: 7 g/dL (ref 6.5–8.1)

## 2016-01-15 LAB — I-STAT CG4 LACTIC ACID, ED
LACTIC ACID, VENOUS: 1.62 mmol/L (ref 0.5–2.0)
Lactic Acid, Venous: 0.89 mmol/L (ref 0.5–2.0)

## 2016-01-15 LAB — URINALYSIS, ROUTINE W REFLEX MICROSCOPIC
BILIRUBIN URINE: NEGATIVE
GLUCOSE, UA: NEGATIVE mg/dL
HGB URINE DIPSTICK: NEGATIVE
KETONES UR: NEGATIVE mg/dL
Leukocytes, UA: NEGATIVE
NITRITE: NEGATIVE
PH: 7 (ref 5.0–8.0)
Protein, ur: NEGATIVE mg/dL
Specific Gravity, Urine: 1.012 (ref 1.005–1.030)

## 2016-01-15 MED ORDER — ACETAMINOPHEN 650 MG RE SUPP: 650.0000 mg | Freq: Four times a day (QID) | RECTAL | Status: DC | PRN

## 2016-01-15 MED ORDER — POLYETHYLENE GLYCOL 3350 17 G PO PACK: 17.0000 g | PACK | Freq: Every day | ORAL | Status: DC | PRN

## 2016-01-15 MED ORDER — SACCHAROMYCES BOULARDII 250 MG PO CAPS
250.0000 mg | ORAL_CAPSULE | Freq: Two times a day (BID) | ORAL | Status: DC
  Administered 2016-01-16 – 2016-01-21 (×9): 250 mg via ORAL
  Filled 2016-01-15 (×11): qty 1

## 2016-01-15 MED ORDER — CLINDAMYCIN PHOSPHATE 600 MG/50ML IV SOLN
600.0000 mg | Freq: Once | INTRAVENOUS | Status: AC
  Administered 2016-01-15: 600 mg via INTRAVENOUS
  Filled 2016-01-15: qty 50

## 2016-01-15 MED ORDER — DOCUSATE SODIUM 100 MG PO CAPS
100.0000 mg | ORAL_CAPSULE | Freq: Two times a day (BID) | ORAL | Status: DC
  Administered 2016-01-16 (×2): 100 mg via ORAL
  Filled 2016-01-15 (×2): qty 1

## 2016-01-15 MED ORDER — BOOST / RESOURCE BREEZE PO LIQD
1.0000 | Freq: Every day | ORAL | Status: DC
  Administered 2016-01-17 – 2016-01-19 (×3): 1 via ORAL

## 2016-01-15 MED ORDER — ACETAMINOPHEN 325 MG PO TABS: 650.0000 mg | ORAL_TABLET | Freq: Four times a day (QID) | ORAL | Status: DC | PRN

## 2016-01-15 MED ORDER — HYDROCODONE-ACETAMINOPHEN 5-325 MG PO TABS
1.0000 | ORAL_TABLET | ORAL | Status: DC | PRN
  Administered 2016-01-16 (×2): 2 via ORAL
  Filled 2016-01-15 (×2): qty 2

## 2016-01-15 MED ORDER — INSULIN ASPART 100 UNIT/ML ~~LOC~~ SOLN
0.0000 [IU] | Freq: Three times a day (TID) | SUBCUTANEOUS | Status: DC
Start: 2016-01-16 — End: 2016-01-21
  Administered 2016-01-19: 1 [IU] via SUBCUTANEOUS

## 2016-01-15 MED ORDER — ATENOLOL 50 MG PO TABS
50.0000 mg | ORAL_TABLET | Freq: Every day | ORAL | Status: DC
  Administered 2016-01-16 – 2016-01-21 (×4): 50 mg via ORAL
  Filled 2016-01-15 (×5): qty 1

## 2016-01-15 MED ORDER — ONDANSETRON HCL 4 MG PO TABS
4.0000 mg | ORAL_TABLET | Freq: Four times a day (QID) | ORAL | Status: DC | PRN
  Filled 2016-01-15: qty 1

## 2016-01-15 MED ORDER — RIVAROXABAN 20 MG PO TABS
20.0000 mg | ORAL_TABLET | Freq: Every day | ORAL | Status: DC
  Administered 2016-01-16: 20 mg via ORAL
  Filled 2016-01-15 (×2): qty 1

## 2016-01-15 MED ORDER — COLLAGENASE 250 UNIT/GM EX OINT
1.0000 "application " | TOPICAL_OINTMENT | Freq: Two times a day (BID) | CUTANEOUS | Status: DC
  Administered 2016-01-16 – 2016-01-21 (×7): 1 via TOPICAL
  Filled 2016-01-15: qty 30

## 2016-01-15 MED ORDER — ONDANSETRON HCL 4 MG/2ML IJ SOLN
4.0000 mg | Freq: Four times a day (QID) | INTRAMUSCULAR | Status: DC | PRN
  Administered 2016-01-18 – 2016-01-19 (×2): 4 mg via INTRAVENOUS
  Filled 2016-01-15 (×3): qty 2

## 2016-01-15 MED ORDER — POTASSIUM CHLORIDE CRYS ER 20 MEQ PO TBCR
40.0000 meq | EXTENDED_RELEASE_TABLET | Freq: Once | ORAL | Status: AC
  Administered 2016-01-15: 40 meq via ORAL
  Filled 2016-01-15: qty 2

## 2016-01-15 MED ORDER — SODIUM CHLORIDE 0.9 % IV SOLN
INTRAVENOUS | Status: AC
  Administered 2016-01-16: via INTRAVENOUS

## 2016-01-15 MED ORDER — LOSARTAN POTASSIUM 50 MG PO TABS
25.0000 mg | ORAL_TABLET | Freq: Every day | ORAL | Status: DC
  Administered 2016-01-16 – 2016-01-21 (×4): 25 mg via ORAL
  Filled 2016-01-15 (×5): qty 1

## 2016-01-15 MED ORDER — INSULIN ASPART 100 UNIT/ML ~~LOC~~ SOLN: 0.0000 [IU] | Freq: Every day | SUBCUTANEOUS | Status: DC

## 2016-01-15 MED ORDER — MIRTAZAPINE 15 MG PO TABS
15.0000 mg | ORAL_TABLET | Freq: Every day | ORAL | Status: DC
  Administered 2016-01-16 – 2016-01-17 (×2): 15 mg via ORAL
  Filled 2016-01-15 (×3): qty 1

## 2016-01-15 MED ORDER — DOXYCYCLINE HYCLATE 100 MG PO TABS
100.0000 mg | ORAL_TABLET | Freq: Two times a day (BID) | ORAL | Status: DC
  Administered 2016-01-16 (×2): 100 mg via ORAL
  Filled 2016-01-15 (×2): qty 1

## 2016-01-15 MED ORDER — SODIUM CHLORIDE 0.9% FLUSH
3.0000 mL | Freq: Two times a day (BID) | INTRAVENOUS | Status: DC
Start: 2016-01-16 — End: 2016-01-21
  Administered 2016-01-16 – 2016-01-19 (×5): 3 mL via INTRAVENOUS

## 2016-01-15 MED ORDER — FAMOTIDINE 20 MG PO TABS
20.0000 mg | ORAL_TABLET | Freq: Every day | ORAL | Status: DC | PRN
  Administered 2016-01-17: 20 mg via ORAL
  Filled 2016-01-15: qty 1

## 2016-01-15 MED ORDER — CIPROFLOXACIN IN D5W 400 MG/200ML IV SOLN
400.0000 mg | Freq: Once | INTRAVENOUS | Status: AC
  Administered 2016-01-15: 400 mg via INTRAVENOUS
  Filled 2016-01-15: qty 200

## 2016-01-15 NOTE — ED Provider Notes (Signed)
CSN: [id number]     Arrival date & time 01/15/16  1648 History   First MD Initiated Contact with Patient 01/15/16 1830     Chief Complaint  Patient presents with  . Foot Ulcer     The history is provided by the patient. No language interpreter was used.  Jasmine Fernandez is a 82 y.o. female who presents to the Emergency Department complaining of foot pain.  History is provided by the patient and her daughter. About 5 days ago she started to express increased pain and difficulty walking on her right foot. She left her nursing facility 6 days ago. She left without any of her medications. She has not been caring for her foot wounds or changing the dressings. Since leaving the nursing facility the pain has continued to increase and she now has a foul smell from the wounds and redness. At the beginning of April she was started on doxycycline for possible infection to the wound. She has completed the antibiotic course. No fevers, chest pain, shortness of breath, abdominal pain, vomiting, chills.   Past Medical History  Diagnosis Date  . Hypertension   . Goiter   . Aortic stenosis   . Rheumatic fever   . Diabetes mellitus without complication [facility])     patient states she is not diabetic.    Past Surgical History  Procedure Laterality Date  . Tonsillectomy      During Childhood  . Peripheral vascular catheterization N/A 08/19/2015    Procedure: Lower Extremity Angiography;  Surgeon: Nada Libman, MD;  Location: [facility] INVASIVE CV LAB;  Service: Cardiovascular;  Laterality: N/A;   Family History  Problem Relation Age of Onset  . Heart disease Father   . Heart attack Father    Social History  Substance Use Topics  . Smoking status: Never Smoker   . Smokeless tobacco: None  . Alcohol Use: No   OB History    No data available     Review of Systems  All other systems reviewed and are negative.     Allergies  Penicillins  Home Medications   Prior to Admission medications    Medication Sig Start Date End Date Taking? Authorizing Provider  acetaminophen (TYLENOL) 325 MG tablet Take 325 mg by mouth every 4 (four) hours as needed for mild pain.    Yes Historical Provider, MD  alum & mag hydroxide-simeth (MAALOX PLUS) 400-400-40 MG/5ML suspension Take 30 mLs by mouth every 6 (six) hours as needed for indigestion.    Yes Historical Provider, MD  diphenhydrAMINE (BENADRYL) 25 mg capsule Take 25 mg by mouth every 6 (six) hours as needed for itching.    Yes Historical Provider, MD  famotidine (PEPCID) 20 MG tablet Take 20 mg by mouth daily as needed for heartburn or indigestion.   Yes Historical Provider, MD  feeding supplement (BOOST / RESOURCE BREEZE) LIQD Take 240 mLs by mouth daily after breakfast.    Yes Historical Provider, MD  HYDROcodone-acetaminophen (NORCO/VICODIN) 5-325 MG tablet Take one tablet by mouth every 6 hours as needed for pain. Do not exceed 4gm of Tylenol in 24 hours Patient taking differently: Take 1 tablet by mouth every 6 (six) hours as needed. pain. Do not exceed 4gm of Tylenol in 24 hours 10/05/15  Yes Tiffany L Reed, DO  indapamide (LOZOL) 1.25 MG tablet Take 1.25 mg by mouth every morning.   Yes Historical Provider, MD  losartan (COZAAR) 25 MG tablet Take 25 mg by mouth daily.  Yes Historical Provider, MD  mirtazapine (REMERON) 15 MG tablet Take 15 mg by mouth at bedtime.   Yes Historical Provider, MD  potassium chloride SA (K-DUR,KLOR-CON) 20 MEQ tablet Take 20 mEq by mouth daily.   Yes Historical Provider, MD  promethazine (PHENERGAN) 25 MG tablet Take 25 mg by mouth every 4 (four) hours as needed for nausea or vomiting.   Yes Historical Provider, MD  rivaroxaban (XARELTO) 20 MG TABS tablet Take 20 mg by mouth daily with breakfast.    Yes Historical Provider, MD  saccharomyces boulardii (FLORASTOR) 250 MG capsule Take 1 capsule (250 mg total) by mouth 2 (two) times daily. 07/03/15  Yes Marinda ElkAbraham Feliz Ortiz, MD  SANTYL ointment Apply 1 application  topically 2 (two) times daily. For wound care. 12/25/15  Yes Historical Provider, MD  TENORMIN 50 MG tablet Take 50 mg by mouth daily. 04/06/15  Yes Historical Provider, MD   BP 155/60 mmHg  Pulse 73  Temp(Src) 98.2 F (36.8 C) (Oral)  Resp 18  SpO2 99% Physical Exam  Constitutional: She is oriented to person, place, and time. She appears well-developed and well-nourished.  HENT:  Head: Normocephalic and atraumatic.  Cardiovascular: Normal rate and regular rhythm.   SEM  Pulmonary/Chest: Effort normal and breath sounds normal. No respiratory distress.  Abdominal: Soft. There is no tenderness. There is no rebound and no guarding.  Musculoskeletal:  trace pitting edema of RLE.  Faint DP pulses.  Large eschar and ulceration over the right heel with surrounding erythema and tenderness.    Neurological: She is alert and oriented to person, place, and time.  Skin: Skin is warm and dry.  Psychiatric: She has a normal mood and affect. Her behavior is normal.  Nursing note and vitals reviewed.   ED Course  Procedures (including critical care time) Labs Review Labs Reviewed  COMPREHENSIVE METABOLIC PANEL - Abnormal; Notable for the following:    Potassium 3.3 (*)    Glucose, Bld 135 (*)    All other components within normal limits  CBC WITH DIFFERENTIAL/PLATELET - Abnormal; Notable for the following:    WBC 12.2 (*)    Hemoglobin 11.3 (*)    HCT 33.7 (*)    Neutro Abs 9.5 (*)    All other components within normal limits  CULTURE, BLOOD (ROUTINE X 2)  CULTURE, BLOOD (ROUTINE X 2)  URINE CULTURE  URINALYSIS, ROUTINE W REFLEX MICROSCOPIC (NOT AT [facility])  I-STAT CG4 LACTIC ACID, ED  I-STAT CG4 LACTIC ACID, ED    Imaging Review Dg Foot Complete Right  01/15/2016  CLINICAL DATA:  Heel ulcer since October. EXAM: RIGHT FOOT COMPLETE - 3+ VIEW COMPARISON:  11/16/2015 FINDINGS: Unchanged appearance of skin irregularity and gas over the heel consistent with ulcer. There is no evidence of  osteomyelitis. No opaque foreign body. Hallux valgus and bony bunion.  Osteopenia. IMPRESSION: Heel ulcer without opaque foreign body or evidence of osteomyelitis. Electronically Signed   By: Marnee SpringJonathon  Watts M.D.   On: 01/15/2016 21:26   I have personally reviewed and evaluated these images and lab results as part of my medical decision-making.   EKG Interpretation None      MDM   Final diagnoses:  None    Patient with history of diabetes and medical noncompliance here with increased pain and redness to a diabetic foot ulcer. Examination is concerning for infected ulcer. Plan to treat with antibiotics and admit for observation. Patient is in agreement with plan.    Tilden FossaElizabeth Cecylia Brazill, MD 01/15/16  2344 

## 2016-01-15 NOTE — H&P (Signed)
JAMYE BALICKI XBJ:[id number] DOB: August 05, 1929 DOA: 01/15/2016   Referring MD Pecola Leisure PCP: Kimber Relic, MD   Outpatient Specialists: [facility],   Patient coming from: home  Chief Complaint: Foot redness and foul smell  HPI: Jasmine Fernandez is a 82 y.o. female with medical history significant of peripheral vascular disease with complete occlusion of the right superficial femoral artery diabetes mellitus hypertension medical noncompliance right heel wound chronic    Presented with foul discharge from the right foot around the wound as well as some reddening of the skin She had had a dressing change for past 6 days.  Patient has known right foot ankle wound in March she was seen by podiatrist there was some suspicion for infection at that time and she was started on doxycycline commendations at that time was for her to have vascular studies done Patient has been refusing medications she has had some mild short-term memory losses. Patient has been leaving the facility and staying at her daughter's place because her daughter has psychiatric problems and had to be comitted. She stopped changing her dressings and had no proper care for the wound. When another family a daughter who is a Engineer, civil (consulting)  member have visited today they noted foul smell and patient was brought to emergency department  Regarding pertinent Chronic problems: Patient is diabetic with poor control she also has known history of hypertension but possibly not compliant medications. In 2016 she was found to have complete occlusion of the right superficial femoral artery with poor distal collateralization She thought to be not a candidate for revascularization At the same time she was incidentally found to have femoral DVT and was treated with Lovenox. She has also had wound over the left gluteal in the past requiring wound VAC which started to heal. In the past she has struggled with dysphagia  IN ER: Blood but the  coloration 12.2 hemoglobin 11.3 potassium 3.3 lactic acid 1.6 Brain imaging showing no evidence of osteomyelitis    Hospitalist was called for admission for infected right foot ulcer in the setting of diabetes  Review of Systems:    Pertinent positives include:foul discharge from right foot  Constitutional:  No weight loss, night sweats, Fevers, chills, fatigue, weight loss  HEENT:  No headaches, Difficulty swallowing,Tooth/dental problems,Sore throat,  No sneezing, itching, ear ache, nasal congestion, post nasal drip,  Cardio-vascular:  No chest pain, Orthopnea, PND, anasarca, dizziness, palpitations.no Bilateral lower extremity swelling  GI:  No heartburn, indigestion, abdominal pain, nausea, vomiting, diarrhea, change in bowel habits, loss of appetite, melena, blood in stool, hematemesis Resp:  no shortness of breath at rest. No dyspnea on exertion, No excess mucus, no productive cough, No non-productive cough, No coughing up of blood.No change in color of mucus.No wheezing. Skin:  no rash or lesions. No jaundice GU:  no dysuria, change in color of urine, no urgency or frequency. No straining to urinate.  No flank pain.  Musculoskeletal:  No joint pain or no joint swelling. No decreased range of motion. No back pain.  Psych:  No change in mood or affect. No depression or anxiety. No memory loss.  Neuro: no localizing neurological complaints, no tingling, no weakness, no double vision, no gait abnormality, no slurred speech, no confusion  As per HPI otherwise 10 point review of systems negative.   Past Medical History: Past Medical History  Diagnosis Date  . Hypertension   . Goiter   . Aortic stenosis   . Rheumatic fever   .  Diabetes mellitus without complication [facility])     patient states she is not diabetic.    Past Surgical History  Procedure Laterality Date  . Tonsillectomy      During Childhood  . Peripheral vascular catheterization N/A 08/19/2015    Procedure:  Lower Extremity Angiography;  Surgeon: Nada Libman, MD;  Location: [facility] INVASIVE CV LAB;  Service: Cardiovascular;  Laterality: N/A;     Social History:  Ambulatory   walker   Lives at home With family for the past 6 days prior to this was at [facility] although was in stay and therefore all the duration     reports that she has never smoked. She does not have any smokeless tobacco history on file. She reports that she does not drink alcohol or use illicit drugs.  Allergies:   Allergies  Allergen Reactions  . Penicillins Swelling    Has patient had a PCN reaction causing immediate rash, facial/tongue/throat swelling, SOB or lightheadedness with hypotension: YES Has patient had a PCN reaction causing severe rash involving mucus membranes or skin necrosis:no Has patient had a PCN reaction that required hospitalization: no Has patient had a PCN reaction occurring within the last 10 years: No If all of the above answers are "NO", then may proceed with Cephalosporin use.        Family History:    Family History  Problem Relation Age of Onset  . Heart disease Father   . Heart attack Father     Medications: Prior to Admission medications   Medication Sig Start Date End Date Taking? Authorizing Provider  acetaminophen (TYLENOL) 325 MG tablet Take 325 mg by mouth every 4 (four) hours as needed for mild pain.    Yes Historical Provider, MD  alum & mag hydroxide-simeth (MAALOX PLUS) 400-400-40 MG/5ML suspension Take 30 mLs by mouth every 6 (six) hours as needed for indigestion.    Yes Historical Provider, MD  diphenhydrAMINE (BENADRYL) 25 mg capsule Take 25 mg by mouth every 6 (six) hours as needed for itching.    Yes Historical Provider, MD  famotidine (PEPCID) 20 MG tablet Take 20 mg by mouth daily as needed for heartburn or indigestion.   Yes Historical Provider, MD  feeding supplement (BOOST / RESOURCE BREEZE) LIQD Take 240 mLs by mouth daily after breakfast.    Yes  Historical Provider, MD  HYDROcodone-acetaminophen (NORCO/VICODIN) 5-325 MG tablet Take one tablet by mouth every 6 hours as needed for pain. Do not exceed 4gm of Tylenol in 24 hours Patient taking differently: Take 1 tablet by mouth every 6 (six) hours as needed. pain. Do not exceed 4gm of Tylenol in 24 hours 10/05/15  Yes Tiffany L Reed, DO  promethazine (PHENERGAN) 25 MG tablet Take 25 mg by mouth every 4 (four) hours as needed for nausea or vomiting.   Yes Historical Provider, MD  rivaroxaban (XARELTO) 20 MG TABS tablet Take 20 mg by mouth daily with breakfast.    Yes Historical Provider, MD  indapamide (LOZOL) 1.25 MG tablet Take 1.25 mg by mouth every morning.    Historical Provider, MD  lamoTRIgine (LAMICTAL) 25 MG tablet Take 50 mg by mouth at bedtime.    Historical Provider, MD  losartan (COZAAR) 25 MG tablet Take 25 mg by mouth daily.    Historical Provider, MD  mirtazapine (REMERON) 15 MG tablet Take 15 mg by mouth at bedtime.    Historical Provider, MD  potassium chloride SA (K-DUR,KLOR-CON) 20 MEQ tablet Take 20 mEq by  mouth daily.    Historical Provider, MD  saccharomyces boulardii (FLORASTOR) 250 MG capsule Take 1 capsule (250 mg total) by mouth 2 (two) times daily. 07/03/15   Marinda ElkAbraham Feliz Ortiz, MD  TENORMIN 50 MG tablet Take 50 mg by mouth daily. 04/06/15   Historical Provider, MD    Physical Exam: Patient Vitals for the past 24 hrs:  BP Temp Temp src Pulse Resp SpO2  01/15/16 1832 (!) 166/48 mmHg - - 78 16 98 %  01/15/16 1830 - - - 73 - 98 %  01/15/16 1815 147/68 mmHg - - - - -  01/15/16 1653 173/57 mmHg 98.2 F (36.8 C) Oral 76 16 99 %    1. General:  in No Acute distress 2. Psychological: Alert and  Oriented to self, not situation frequently repeats self 3. Head/ENT:     Dry Mucous Membranes                          Head Non traumatic, neck supple                          Poor Dentition 4. SKIN:   decreased Skin turgor,  Skin clean Dry redness over the right foot with  ulceration present    5. Heart: Regular rate and rhythm loud systolic Murmur, Rub or gallop 6. Lungs:  Clear to auscultation bilaterally, no wheezes or crackles   7. Abdomen: Soft, non-tender, Non distended 8. Lower extremities: no clubbing, cyanosis, or edema 9. Neurologically Grossly intact, moving all 4 extremities equally 10. MSK: Normal range of motion   body mass index is unknown because there is no weight on file.  Labs on Admission:   Labs on Admission: I have personally reviewed following labs and imaging studies  CBC:  Recent Labs Lab 01/15/16 1730  WBC 12.2*  NEUTROABS 9.5*  HGB 11.3*  HCT 33.7*  MCV 85.5  PLT 237   Basic Metabolic Panel:  Recent Labs Lab 01/15/16 1730  NA 142  K 3.3*  CL 105  CO2 27  GLUCOSE 135*  BUN 12  CREATININE 0.81  CALCIUM 9.6   GFR: CrCl cannot be calculated (Unknown ideal weight.). Liver Function Tests:  Recent Labs Lab 01/15/16 1730  AST 22  ALT 14  ALKPHOS 67  BILITOT 0.4  PROT 7.0  ALBUMIN 3.8   No results for input(s): LIPASE, AMYLASE in the last 168 hours. No results for input(s): AMMONIA in the last 168 hours. Coagulation Profile: No results for input(s): INR, PROTIME in the last 168 hours. Cardiac Enzymes: No results for input(s): CKTOTAL, CKMB, CKMBINDEX, TROPONINI in the last 168 hours. BNP (last 3 results) No results for input(s): PROBNP in the last 8760 hours. HbA1C: No results for input(s): HGBA1C in the last 72 hours. CBG: No results for input(s): GLUCAP in the last 168 hours. Lipid Profile: No results for input(s): CHOL, HDL, LDLCALC, TRIG, CHOLHDL, LDLDIRECT in the last 72 hours. Thyroid Function Tests: No results for input(s): TSH, T4TOTAL, FREET4, T3FREE, THYROIDAB in the last 72 hours. Anemia Panel: No results for input(s): VITAMINB12, FOLATE, FERRITIN, TIBC, IRON, RETICCTPCT in the last 72 hours. Urine analysis:    Component Value Date/Time   COLORURINE YELLOW 01/15/2016 2023    APPEARANCEUR CLEAR 01/15/2016 2023   LABSPEC 1.012 01/15/2016 2023   PHURINE 7.0 01/15/2016 2023   GLUCOSEU NEGATIVE 01/15/2016 2023   HGBUR NEGATIVE 01/15/2016 2023   BILIRUBINUR NEGATIVE  01/15/2016 2023   KETONESUR NEGATIVE 01/15/2016 2023   PROTEINUR NEGATIVE 01/15/2016 2023   UROBILINOGEN 0.2 06/29/2015 1842   NITRITE NEGATIVE 01/15/2016 2023   LEUKOCYTESUR NEGATIVE 01/15/2016 2023   Sepsis Labs: @LABRCNTIP (procalcitonin:4,lacticidven:4) )No results found for this or any previous visit (from the past 240 hour(s)).     UA  no evidence of UTI  Lab Results  Component Value Date   HGBA1C 6.3* 06/30/2015    CrCl cannot be calculated (Unknown ideal weight.).  BNP (last 3 results) No results for input(s): PROBNP in the last 8760 hours.   ECG REPORT Not obtained  There were no vitals filed for this visit.   Cultures:    Component Value Date/Time   SDES BLOOD RIGHT ANTECUBITAL 06/29/2015 1905   SPECREQUEST BOTTLES DRAWN AEROBIC AND ANAEROBIC 5CC  06/29/2015 1905   CULT  06/29/2015 1905    NO GROWTH 5 DAYS Performed at [facility]    REPTSTATUS 07/04/2015 FINAL 06/29/2015 1905     Radiological Exams on Admission: Dg Foot Complete Right  01/15/2016  CLINICAL DATA:  Heel ulcer since October. EXAM: RIGHT FOOT COMPLETE - 3+ VIEW COMPARISON:  11/16/2015 FINDINGS: Unchanged appearance of skin irregularity and gas over the heel consistent with ulcer. There is no evidence of osteomyelitis. No opaque foreign body. Hallux valgus and bony bunion.  Osteopenia. IMPRESSION: Heel ulcer without opaque foreign body or evidence of osteomyelitis. Electronically Signed   By: Marnee Spring M.D.   On: 01/15/2016 21:26    Chart has been reviewed    Assessment/Plan  82 y.o. female with medical history significant of peripheral vascular disease with complete occlusion of the right superficial femoral artery diabetes mellitus hypertension medical noncompliance with chronic right  heel wound that has worsened due to medical noncompliance due to social problems   Present on Admission:  . Diabetic foot ulcer associated with type 2 diabetes mellitus (HCC) - will obtain sedimentation rate prealbumin wound care consult patient was given IV antibiotics emerge department, for now continue doxycycline patient has history of peripheral vascular disease she has seen seen in the past by vascular surgeon at that time it seems like medical management was recommendation likely progression of disease secondary to poor wound care. Due to poor social situation was obtain social work consult patient may need to be readmitted to rehabilitation facility for wound care . Hypokalemia  - will replace  . Cellulitis - mild surrounding erythema over wound. File drawer smell has resolved after wound dressing has been changed. Continue for now on doxycycline and changes necessary  . Hypertension stable continue home medications Other plan as per orders.  DVT prophylaxis:    Lovenox     Code Status:  FULL CODE as per patient    Family Communication:   Family not at  Bedside    Disposition Plan:    likely will need placement for rehabilitation                         Consults called: none  Admission status:  Inpatient     Level of care    medical floor       I have spent a total of 56 min on this admission    Archimedes Harold 01/15/2016, 11:14 PM    [facility]  Pager [phone]   after 2 AM please page floor coverage PA If 7AM-7PM, please contact the day team taking care of the patient  [email or web address]  Password [email or web address]

## 2016-01-15 NOTE — ED Notes (Signed)
Pt has wound on rt foot, has been residing at [facility], daughter took her out of facility about 6 days ago and has not changed dressing. Another daughter come to commit daughter taking care of mother, at this time noticed foul odor from dressing.

## 2016-01-16 DIAGNOSIS — L97509 Non-pressure chronic ulcer of other part of unspecified foot with unspecified severity: Secondary | ICD-10-CM

## 2016-01-16 DIAGNOSIS — L97503 Non-pressure chronic ulcer of other part of unspecified foot with necrosis of muscle: Secondary | ICD-10-CM

## 2016-01-16 LAB — COMPREHENSIVE METABOLIC PANEL
ALT: 12 U/L — AB (ref 14–54)
AST: 17 U/L (ref 15–41)
Albumin: 3.6 g/dL (ref 3.5–5.0)
Alkaline Phosphatase: 60 U/L (ref 38–126)
Anion gap: 8 (ref 5–15)
BILIRUBIN TOTAL: 0.6 mg/dL (ref 0.3–1.2)
BUN: 10 mg/dL (ref 6–20)
CO2: 27 mmol/L (ref 22–32)
CREATININE: 0.85 mg/dL (ref 0.44–1.00)
Calcium: 9.2 mg/dL (ref 8.9–10.3)
Chloride: 108 mmol/L (ref 101–111)
Glucose, Bld: 74 mg/dL (ref 65–99)
POTASSIUM: 3.5 mmol/L (ref 3.5–5.1)
Sodium: 143 mmol/L (ref 135–145)
TOTAL PROTEIN: 6.7 g/dL (ref 6.5–8.1)

## 2016-01-16 LAB — CBC
HCT: 34.8 % — ABNORMAL LOW (ref 36.0–46.0)
Hemoglobin: 11.3 g/dL — ABNORMAL LOW (ref 12.0–15.0)
MCH: 28.7 pg (ref 26.0–34.0)
MCHC: 32.5 g/dL (ref 30.0–36.0)
MCV: 88.3 fL (ref 78.0–100.0)
PLATELETS: 263 10*3/uL (ref 150–400)
RBC: 3.94 MIL/uL (ref 3.87–5.11)
RDW: 13.7 % (ref 11.5–15.5)
WBC: 13.2 10*3/uL — AB (ref 4.0–10.5)

## 2016-01-16 LAB — GLUCOSE, CAPILLARY
GLUCOSE-CAPILLARY: 88 mg/dL (ref 65–99)
GLUCOSE-CAPILLARY: 85 mg/dL (ref 65–99)
Glucose-Capillary: 118 mg/dL — ABNORMAL HIGH (ref 65–99)
Glucose-Capillary: 81 mg/dL (ref 65–99)
Glucose-Capillary: 82 mg/dL (ref 65–99)

## 2016-01-16 LAB — MRSA PCR SCREENING: MRSA by PCR: POSITIVE — AB

## 2016-01-16 LAB — MAGNESIUM: Magnesium: 2 mg/dL (ref 1.7–2.4)

## 2016-01-16 LAB — PREALBUMIN: PREALBUMIN: 14.4 mg/dL — AB (ref 18–38)

## 2016-01-16 LAB — TSH: TSH: 0.847 u[IU]/mL (ref 0.350–4.500)

## 2016-01-16 LAB — C-REACTIVE PROTEIN: CRP: 0.5 mg/dL (ref <1.0)

## 2016-01-16 LAB — PHOSPHORUS: PHOSPHORUS: 3 mg/dL (ref 2.5–4.6)

## 2016-01-16 LAB — SEDIMENTATION RATE: Sed Rate: 17 mm/hr (ref 0–22)

## 2016-01-16 MED ORDER — SODIUM CHLORIDE 0.9 % IV SOLN
INTRAVENOUS | Status: AC
  Administered 2016-01-16: 19:00:00 via INTRAVENOUS

## 2016-01-16 MED ORDER — CHLORHEXIDINE GLUCONATE CLOTH 2 % EX PADS
6.0000 | MEDICATED_PAD | Freq: Every day | CUTANEOUS | Status: AC
  Administered 2016-01-16 – 2016-01-19 (×3): 6 via TOPICAL

## 2016-01-16 MED ORDER — DIPHENHYDRAMINE HCL 25 MG PO CAPS
25.0000 mg | ORAL_CAPSULE | Freq: Three times a day (TID) | ORAL | Status: DC | PRN
  Administered 2016-01-16: 25 mg via ORAL
  Filled 2016-01-16: qty 1

## 2016-01-16 MED ORDER — MUPIROCIN 2 % EX OINT
1.0000 "application " | TOPICAL_OINTMENT | Freq: Two times a day (BID) | CUTANEOUS | Status: AC
  Administered 2016-01-16 – 2016-01-20 (×8): 1 via NASAL
  Filled 2016-01-16 (×2): qty 22

## 2016-01-16 MED ORDER — DIPHENHYDRAMINE HCL 25 MG PO CAPS
25.0000 mg | ORAL_CAPSULE | Freq: Once | ORAL | Status: AC
  Administered 2016-01-16: 25 mg via ORAL
  Filled 2016-01-16: qty 1

## 2016-01-16 MED ORDER — POLYETHYLENE GLYCOL 3350 17 G PO PACK
17.0000 g | PACK | Freq: Every day | ORAL | Status: DC
  Administered 2016-01-18 – 2016-01-21 (×3): 17 g via ORAL
  Filled 2016-01-16 (×4): qty 1

## 2016-01-16 MED ORDER — CLINDAMYCIN PHOSPHATE 600 MG/50ML IV SOLN
600.0000 mg | Freq: Three times a day (TID) | INTRAVENOUS | Status: DC
  Administered 2016-01-16 – 2016-01-19 (×8): 600 mg via INTRAVENOUS
  Filled 2016-01-16 (×8): qty 50

## 2016-01-16 MED ORDER — CIPROFLOXACIN IN D5W 400 MG/200ML IV SOLN
400.0000 mg | Freq: Two times a day (BID) | INTRAVENOUS | Status: DC
  Administered 2016-01-16 – 2016-01-19 (×5): 400 mg via INTRAVENOUS
  Filled 2016-01-16 (×6): qty 200

## 2016-01-16 NOTE — Progress Notes (Addendum)
PROGRESS NOTE    Jasmine Fernandez  DUK:[id number] DOB: [birth year] DOA: 01/15/2016 PCP: Kimber RelicGREEN, ARTHUR G, MD  Outpatient Specialists:  Reyes IvanPodiatrist,N'tuma, Jah, DPM at [facility], [place]. [address] Hart RochesterLawson, V[facility]    Brief Narrative:  [age] ? Complex vascular history -history complete left-sided RAS ste will nosis - R SFA occlusion, femoral DVT + Lovenox 06/2015  -Seen in the emergency room 07/2015-noted to have chronic nonhealing ulcer then 3 cm diameter -Had angiogram to determine if PTA/stenting R SFA feasible-NOT operative candidate per Dr. Candie ChromanLawson's note  stage III left L gluteal wound10/07/2015 status post fall,? LOC aortic valve stenosis + rheumatic fever history as a child Memory deficit, dementia  right femoral DVT currently on Xarelto Goiter  Dysthymia, depression    [facility] worsening [facility] Heel wound  On admission white count 13, hemoglobin 11, platelet 263, sedimentation rate 17 CMET wnl Foot Xray-no evidence Osteo    Assessment & Plan:   Active Problems:   Hypertension   Diabetic foot ulcer (HCC)   Diabetes mellitus (HCC)   Hypokalemia   Cellulitis   Foot ulcer (HCC)   Diabetic foot ulcer associated with type 2 diabetes mellitus (HCC)  chronic right heel ulcer -Discussed in detail with Dr. Imogene Burnhen, went over angiogram results from 11 2016- -Amputation only operative option-no chances for re-vascularization -Will discuss with patient/family moving forward -Continue IV antibiotics Cipro, Clinda-discontinue doxycycline for now -Norco one to 2 tablets every 4 when necessary pain  -appreciate vascular surgeon input  Diabetes mellitus type 2 -Patient has stated in the past "I'm not diabetic" -blood sugars ranging from 122-135 -Note history of documented noncompliance with medications in the outpatient setting-skilled facility  Hypertension -Continue  losartan 25 daily -continue atenolol 50 daily  Stage III gluteal wound -We will look at patient's wound tomorrow -For now continue Santyl twice a day to wound  History DVT right side -Continue Xarelto 20 daily -Consideration for transition to Lovenox needed if planning surgery? -consider orthopedics to see the patient in a.m.  Moderate protein energy malnutrition -Give boost supplement 1 container daily lunch  Reflux -Continue Pepcid 20 daily when necessary     prophylaxis: anticoagulated Code Status: Full Family Communication: -Unclear who to update-patient perseverating and tangential with regards to who she wants me to give information 2. To be discussed  Disposition Plan: suspect this admission patient will need amputation. Expect this will be done non-emergently in the next 48 hours  Consultants:   Dr Chen-[facility]-telephone consult  Procedures:     Antimicrobials:  Cefepime 4/28 slightly confused No nausea no vomiting Not really hungry as somewhat upset at being in the hospital No diarrhea No chills No rash at present however nursing reports has had a rash prior to admission at the facility nda 4/28    Subjective: Fair    Objective: Filed Vitals:   01/15/16 2230 01/15/16 2245 01/15/16 2300 01/15/16 2313  BP:    165/55  Pulse: 70 73 73 72  Temp:    98 F (36.7 C)  TempSrc:    Oral  Resp:    18  Height:    5' (1.524 m)  Weight:    56 kg (123 lb 7.3 oz)  SpO2: 100% 99% 99% 100%   No intake or output data in the 24 hours ending 01/16/16 1442 Filed Weights   01/15/16 2313  Weight: 56 kg (123 lb 7.3 oz)  Examination:  General exam: Appears calm , mod dentition Respiratory system: Clear to auscultation. Respiratory effort normal. Cardiovascular system: S1 & S2 heard, 4/6 HSM- no radiation Gastrointestinal system: Abdomen is nondistended, soft and nontender.  Central nervous system: Alert and oriented. No focal neurological deficits. Extremities:  Symmetric 5 x 5 power. Skin: No rashes, lesions or ulcers Psychiatry: Judgement and insight appear normal. Mood & affect appropriate.     Data Reviewed: I have personally reviewed following labs and imaging studies  CBC:  Recent Labs Lab 01/15/16 1730 01/16/16 0324  WBC 12.2* 13.2*  NEUTROABS 9.5*  --   HGB 11.3* 11.3*  HCT 33.7* 34.8*  MCV 85.5 88.3  PLT 237 263   Basic Metabolic Panel:  Recent Labs Lab 01/15/16 1730 01/16/16 0324  NA 142 143  K 3.3* 3.5  CL 105 108  CO2 27 27  GLUCOSE 135* 74  BUN 12 10  CREATININE 0.81 0.85  CALCIUM 9.6 9.2  MG  --  2.0  PHOS  --  3.0   GFR: Estimated Creatinine Clearance: 38.7 mL/min (by C-G formula based on Cr of 0.85). Liver Function Tests:  Recent Labs Lab 01/15/16 1730 01/16/16 0324  AST 22 17  ALT 14 12*  ALKPHOS 67 60  BILITOT 0.4 0.6  PROT 7.0 6.7  ALBUMIN 3.8 3.6   No results for input(s): LIPASE, AMYLASE in the last 168 hours. No results for input(s): AMMONIA in the last 168 hours. Coagulation Profile: No results for input(s): INR, PROTIME in the last 168 hours. Cardiac Enzymes: No results for input(s): CKTOTAL, CKMB, CKMBINDEX, TROPONINI in the last 168 hours. BNP (last 3 results) No results for input(s): PROBNP in the last 8760 hours. HbA1C: No results for input(s): HGBA1C in the last 72 hours. CBG:  Recent Labs Lab 01/16/16 0010 01/16/16 0739 01/16/16 1131  GLUCAP 118* 88 82   Lipid Profile: No results for input(s): CHOL, HDL, LDLCALC, TRIG, CHOLHDL, LDLDIRECT in the last 72 hours. Thyroid Function Tests:  Recent Labs  01/16/16 0324  TSH 0.847   Anemia Panel: No results for input(s): VITAMINB12, FOLATE, FERRITIN, TIBC, IRON, RETICCTPCT in the last 72 hours. Urine analysis:    Component Value Date/Time   COLORURINE YELLOW 01/15/2016 2023   APPEARANCEUR CLEAR 01/15/2016 2023   LABSPEC 1.012 01/15/2016 2023   PHURINE 7.0 01/15/2016 2023   GLUCOSEU NEGATIVE 01/15/2016 2023   HGBUR  NEGATIVE 01/15/2016 2023   BILIRUBINUR NEGATIVE 01/15/2016 2023   KETONESUR NEGATIVE 01/15/2016 2023   PROTEINUR NEGATIVE 01/15/2016 2023   UROBILINOGEN 0.2 06/29/2015 1842   NITRITE NEGATIVE 01/15/2016 2023   LEUKOCYTESUR NEGATIVE 01/15/2016 2023   Sepsis Labs: (procalcitonin:4,lacticidven:4)  ) Recent Results (from the past 240 hour(s))  Culture, blood (Routine x 2)     Status: None (Preliminary result)   Collection Time: 01/15/16  5:31 PM  Result Value Ref Range Status   Specimen Description LEFT ANTECUBITAL  Final   Special Requests BOTTLES DRAWN AEROBIC AND ANAEROBIC 5CC  Final   Culture   Final    NO GROWTH < 24 HOURS Performed at [facility]    Report Status PENDING  Incomplete  Culture, blood (Routine x 2)     Status: None (Preliminary result)   Collection Time: 01/15/16  6:16 PM  Result Value Ref Range Status   Specimen Description BLOOD RIGHT ANTECUBITAL  Final   Special Requests BOTTLES DRAWN AEROBIC AND ANAEROBIC 5 ML  Final   Culture   Final    NO  GROWTH < 12 HOURS Performed at [facility]    Report Status PENDING  Incomplete  Urine culture     Status: None (Preliminary result)   Collection Time: 01/27/16  8:23 PM  Result Value Ref Range Status   Specimen Description URINE, CLEAN CATCH  Final   Special Requests NONE  Final   Culture   Final    TOO YOUNG TO READ Performed at [facility]    Report Status PENDING  Incomplete  MRSA PCR Screening     Status: Abnormal   Collection Time: 01/16/16 12:41 AM  Result Value Ref Range Status   MRSA by PCR POSITIVE (A) NEGATIVE Final    Comment:        The GeneXpert MRSA Assay (FDA approved for NASAL specimens only), is one component of a comprehensive MRSA colonization surveillance program. It is not intended to diagnose MRSA infection nor to guide or monitor treatment for MRSA infections. RESULT CALLED TO, READ BACK BY AND VERIFIED WITH: Karlene Lineman AT 1610 ON 04.29.2017 BY  NBROOKS          Radiology Studies: Dg Foot Complete Right  Jan 27, 2016  CLINICAL DATA:  Heel ulcer since October. EXAM: RIGHT FOOT COMPLETE - 3+ VIEW COMPARISON:  11/16/2015 FINDINGS: Unchanged appearance of skin irregularity and gas over the heel consistent with ulcer. There is no evidence of osteomyelitis. No opaque foreign body. Hallux valgus and bony bunion.  Osteopenia. IMPRESSION: Heel ulcer without opaque foreign body or evidence of osteomyelitis. Electronically Signed   By: Marnee Spring M.D.   On: 2016-01-27 21:26        Scheduled Meds: . atenolol  50 mg Oral Daily  . Chlorhexidine Gluconate Cloth  6 each Topical Q0600  . collagenase  1 application Topical BID  . feeding supplement  1 Container Oral QPC lunch  . insulin aspart  0-5 Units Subcutaneous QHS  . insulin aspart  0-9 Units Subcutaneous TID WC  . losartan  25 mg Oral Daily  . mirtazapine  15 mg Oral QHS  . mupirocin ointment  1 application Nasal BID  . polyethylene glycol  17 g Oral Daily  . rivaroxaban  20 mg Oral Q breakfast  . saccharomyces boulardii  250 mg Oral BID  . sodium chloride flush  3 mL Intravenous Q12H   Continuous Infusions:    LOS: 1 day    Time spent: 86    Pleas Koch, MD [facility] ([phone]   If 7PM-7AM, please contact night-coverage [email or web address] Password [email or web address] 01/16/2016, 2:42 PM

## 2016-01-16 NOTE — Progress Notes (Signed)
Patient stated that the Dr can talk to her daughter Towanda [place]Mary [place], [place] [phone], MD paged. Will continue to monitor.

## 2016-01-17 LAB — GLUCOSE, CAPILLARY
GLUCOSE-CAPILLARY: 80 mg/dL (ref 65–99)
GLUCOSE-CAPILLARY: 82 mg/dL (ref 65–99)
GLUCOSE-CAPILLARY: 105 mg/dL — AB (ref 65–99)
GLUCOSE-CAPILLARY: 90 mg/dL (ref 65–99)

## 2016-01-17 LAB — URINE CULTURE

## 2016-01-17 NOTE — Progress Notes (Signed)
Pt refused evening meds.  Pt woke up, refused to get back in bed, very agitated, request to have IV stopped, IV ordered had expired and pt saline locked.  Pt stated, "I have patience, I will stay up until the cows come home. I am not getting back into that bed, it has an alarm on it.  You have me up here on the 3rd floor, trying to keep me in prison, My (daughter, sister or brother) is coming to pick me up.  Called daughter, Towanda MalkinMary Alice, at worked and talked to husband, asked if wife would be able to call and talk to pt. No call back.  AC, Maralyn SagoSarah talked with her, no prevail.  On call provider notified, no new orders at this time.  NT and I took turns sitting with pt.  Eventually pt returned to bed and fell asleep.  Pt refused am labs.

## 2016-01-17 NOTE — Progress Notes (Signed)
PT Cancellation Note  Patient Details Name: Jyl Heinzauline D Resh MRN: [id number] DOB: [birth year]   Cancelled Treatment:     PT order received but eval deferred.  RN advises pt "refusing everything" today.  Will follow.   Jane Birkel 01/17/2016, 10:48 AM

## 2016-01-17 NOTE — Progress Notes (Signed)
PROGRESS NOTE    Jasmine Fernandez  ZOX:[id number] DOB: 06-11-1929 DOA: 01/15/2016 PCP: Kimber Relic, MD  Outpatient Specialists:  Reyes Ivan, DPM at [facility] [place]. [address] Hart Rochester, VVS    Brief Narrative:  62 ? Complex vascular history -history complete left-sided RAS stenosis - R SFA occlusion, femoral DVT + Lovenox 06/2015  -Seen in the emergency room 07/2015-noted to have chronic nonhealing ulcer then 3 cm diameter -Had angiogram to determine if PTA/stenting R SFA feasible-NOT operative candidate per Dr. Candie Chroman note  stage III left L gluteal wound10/07/2015 status post fall,? LOC aortic valve stenosis + rheumatic fever history as a child Memory deficit, dementia  right femoral DVT currently on Xarelto Goiter  Dysthymia, depression    recently allegedly left [facility] admitted with sepsis, worsening R Heel wound  On admission white count 13, hemoglobin 11, platelet 263, sedimentation rate 17 CMET wnl Foot Xray-no evidence Osteo    Assessment & Plan:   Active Problems:   Hypertension   Diabetic foot ulcer (HCC)   Diabetes mellitus (HCC)   Hypokalemia   Cellulitis   Foot ulcer (HCC)   Diabetic foot ulcer associated with type 2 diabetes mellitus (HCC)  chronic right heel ulcer -Discussed in detail with Dr. Imogene Burn, went over angiogram results from 11 2016 -Amputation only operative option-no chances for re-vascularization -have d/w patient-she is agreeable to proceure -Continue IV antibiotics Cipro, Clinda-discontinue doxycycline for now -Norco one to 2 tablets every 4 when necessary pain  -appreciate vascular surgeon input -Long discussion with patient + son at the bedside and explained on  01/17/2016 That the patient has no real other options other than amputation of the lower extremity and that this probably would not do a bleeding heel with long-term antibiotics -I  will consult orthopedics 01/18/2016 to evaluate for amputation  Diabetes mellitus type 2 -Patient has stated in the past "I'm not diabetic" -blood sugars ranging from 122-135 -Note history of documented noncompliance with medications in the outpatient setting-skilled facility  Hypertension -Continue losartan 25 daily -continue atenolol 50 daily  Stage III gluteal wound -We will look at patient's wound 01/18/16 -For now continue Santyl twice a day to wound  History DVT right side -Continue Xarelto 20 daily -Consideration for transition to Lovenox needed if planning surgery?  Moderate protein energy malnutrition -Give boost supplement 1 container daily lunch  Mod-severe A0S Diastolic heart failure-compensated and nonacute PASP 31 mm hg -Need cardiology input prior to decision for surgery given history of rheumatism and severe aortic stenosis per echo 07/01/2015  Reflux -Continue Pepcid 20 daily when necessary     prophylaxis: anticoagulated Code Status: Full Family Communication: -Updated son fully at the bedside Disposition Plan: suspect this admission patient will need amputation.  Consultants:   Dr Chen-[facility]-telephone consult  Procedures:     Antimicrobials:  Cefepime 4/28    Subjective: Fair  Nursing reports that patient has been refusing care medications etc. etc. Her son came to visit her today She is tolerating some diet only fairly She is adamant that she does not want to go back to a skilled facility and seems to blame them for these issues that are currently plaguing her   Objective: Filed Vitals:   01/16/16 1350 01/16/16 2109 01/17/16 0554 01/17/16 1315  BP: 140/61 120/45 137/67 139/60  Pulse: 80 62 70 79  Temp: 98.2 F (36.8 C) 97.9 F (36.6 C) 98.4 F (36.9 C) 98.3 F (36.8  C)  TempSrc: Oral Oral Oral Oral  Resp: Height:      Weight:      SpO2: 97% 94% 98% 98%    Intake/Output Summary (Last 24 hours) at 01/17/16  1440 Last data filed at 01/17/16 1230  Gross per 24 hour  Intake   1175 ml  Output      0 ml  Net   1175 ml   Filed Weights   01/15/16 2313  Weight: 56 kg (123 lb 7.3 oz)    Examination:  General exam: Appears calm , mod dentition Respiratory system: Clear to auscultation. Respiratory effort normal. Cardiovascular system: S1 & S2 heard, 4/6 HSM- no radiation Gastrointestinal system: Abdomen is nondistended, soft and nontender.  Central nervous system: Alert and oriented. No focal neurological deficits. Extremities: Symmetric 5 x 5 power. Skin: No rashes, lesions or ulcers Psychiatry: Judgement and insight appear normal. Mood & affect appropriate.     Data Reviewed: I have personally reviewed following labs and imaging studies  CBC:  Recent Labs Lab 01/15/16 1730 01/16/16 0324  WBC 12.2* 13.2*  NEUTROABS 9.5*  --   HGB 11.3* 11.3*  HCT 33.7* 34.8*  MCV 85.5 88.3  PLT 237 263   Basic Metabolic Panel:  Recent Labs Lab 01/15/16 1730 01/16/16 0324  NA 142 143  K 3.3* 3.5  CL 105 108  CO2 27 27  GLUCOSE 135* 74  BUN 12 10  CREATININE 0.81 0.85  CALCIUM 9.6 9.2  MG  --  2.0  PHOS  --  3.0   GFR: Estimated Creatinine Clearance: 38.7 mL/min (by C-G formula based on Cr of 0.85). Liver Function Tests:  Recent Labs Lab 01/15/16 1730 01/16/16 0324  AST 22 17  ALT 14 12*  ALKPHOS 67 60  BILITOT 0.4 0.6  PROT 7.0 6.7  ALBUMIN 3.8 3.6   No results for input(s): LIPASE, AMYLASE in the last 168 hours. No results for input(s): AMMONIA in the last 168 hours. Coagulation Profile: No results for input(s): INR, PROTIME in the last 168 hours. Cardiac Enzymes: No results for input(s): CKTOTAL, CKMB, CKMBINDEX, TROPONINI in the last 168 hours. BNP (last 3 results) No results for input(s): PROBNP in the last 8760 hours. HbA1C: No results for input(s): HGBA1C in the last 72 hours. CBG:  Recent Labs Lab 01/16/16 1131 01/16/16 1650 01/16/16 2105  01/17/16 0744 01/17/16 1114  GLUCAP 82 81 85 80 82   Lipid Profile: No results for input(s): CHOL, HDL, LDLCALC, TRIG, CHOLHDL, LDLDIRECT in the last 72 hours. Thyroid Function Tests:  Recent Labs  01/16/16 0324  TSH 0.847   Anemia Panel: No results for input(s): VITAMINB12, FOLATE, FERRITIN, TIBC, IRON, RETICCTPCT in the last 72 hours. Urine analysis:    Component Value Date/Time   COLORURINE YELLOW 01/15/2016 2023   APPEARANCEUR CLEAR 01/15/2016 2023   LABSPEC 1.012 01/15/2016 2023   PHURINE 7.0 01/15/2016 2023   GLUCOSEU NEGATIVE 01/15/2016 2023   HGBUR NEGATIVE 01/15/2016 2023   BILIRUBINUR NEGATIVE 01/15/2016 2023   KETONESUR NEGATIVE 01/15/2016 2023   PROTEINUR NEGATIVE 01/15/2016 2023   UROBILINOGEN 0.2 06/29/2015 1842   NITRITE NEGATIVE 01/15/2016 2023   LEUKOCYTESUR NEGATIVE 01/15/2016 2023   Sepsis Labs: (procalcitonin:4,lacticidven:4)  ) Recent Results (from the past 240 hour(s))  Culture, blood (Routine x 2)     Status: None (Preliminary result)   Collection Time: 01/15/16  5:31 PM  Result Value Ref Range Status   Specimen Description LEFT ANTECUBITAL  Final  Special Requests BOTTLES DRAWN AEROBIC AND ANAEROBIC 5CC  Final   Culture   Final    NO GROWTH 2 DAYS [facility]    Report Status PENDING  Incomplete  Culture, blood (Routine x 2)     Status: None (Preliminary result)   Collection Time: 01/15/16  6:16 PM  Result Value Ref Range Status   Specimen Description BLOOD RIGHT ANTECUBITAL  Final   Special Requests BOTTLES DRAWN AEROBIC AND ANAEROBIC 5 ML  Final   Culture   Final    NO GROWTH 2 DAYS [facility]    Report Status PENDING  Incomplete  Urine culture     Status: Abnormal   Collection Time: 01/15/16  8:23 PM  Result Value Ref Range Status   Specimen Description URINE, CLEAN CATCH  Final   Special Requests NONE  Final   Culture MULTIPLE SPECIES PRESENT, SUGGEST RECOLLECTION (A)  Final    Report Status 01/17/2016 FINAL  Final  MRSA PCR Screening     Status: Abnormal   Collection Time: 01/16/16 12:41 AM  Result Value Ref Range Status   MRSA by PCR POSITIVE (A) NEGATIVE Final    Comment:        The GeneXpert MRSA Assay (FDA approved for NASAL specimens only), is one component of a comprehensive MRSA colonization surveillance program. It is not intended to diagnose MRSA infection nor to guide or monitor treatment for MRSA infections. RESULT CALLED TO, READ BACK BY AND VERIFIED WITH: Karlene LinemanM MOYE AT [id number] ON 04.29.2017 BY NBROOKS          Radiology Studies: Dg Foot Complete Right  01/15/2016  CLINICAL DATA:  Heel ulcer since October. EXAM: RIGHT FOOT COMPLETE - 3+ VIEW COMPARISON:  11/16/2015 FINDINGS: Unchanged appearance of skin irregularity and gas over the heel consistent with ulcer. There is no evidence of osteomyelitis. No opaque foreign body. Hallux valgus and bony bunion.  Osteopenia. IMPRESSION: Heel ulcer without opaque foreign body or evidence of osteomyelitis. Electronically Signed   By: Marnee SpringJonathon  Watts M.D.   On: 01/15/2016 21:26        Scheduled Meds: . atenolol  50 mg Oral Daily  . Chlorhexidine Gluconate Cloth  6 each Topical Q0600  . ciprofloxacin  400 mg Intravenous Q12H  . clindamycin (CLEOCIN) IV  600 mg Intravenous Q8H  . collagenase  1 application Topical BID  . feeding supplement  1 Container Oral QPC lunch  . insulin aspart  0-5 Units Subcutaneous QHS  . insulin aspart  0-9 Units Subcutaneous TID WC  . losartan  25 mg Oral Daily  . mirtazapine  15 mg Oral QHS  . mupirocin ointment  1 application Nasal BID  . polyethylene glycol  17 g Oral Daily  . rivaroxaban  20 mg Oral Q breakfast  . saccharomyces boulardii  250 mg Oral BID  . sodium chloride flush  3 mL Intravenous Q12H   Continuous Infusions:    LOS: 2 days    Time spent: 3145    Pleas KochJai Autym Siess, MD [facility] [phone]) [phone]   If 7PM-7AM, please contact  night-coverage [email or web address] Password [email or web address] 01/17/2016, 2:40 PM

## 2016-01-17 NOTE — Progress Notes (Signed)
Initial Nutrition Assessment  INTERVENTION:   Continue Boost Breeze po with lunch meal, each supplement provides 250 kcal and 9 grams of protein Once patient more accepting of interventions: recommend Prostat liquid protein PO 30 ml BID with meals, each supplement provides 100 kcal, 15 grams protein.  RD to attempt to speak with patient at a later time.  NUTRITION DIAGNOSIS:   Increased nutrient needs related to wound healing as evidenced by estimated needs.  GOAL:   Patient will meet greater than or equal to 90% of their needs  MONITOR:   PO intake, Supplement acceptance, Labs, Weight trends, Skin, I & O's  REASON FOR ASSESSMENT:   Consult Wound healing  ASSESSMENT:   82 y.o. female with medical history significant of peripheral vascular disease with complete occlusion of the right superficial femoral artery diabetes mellitus hypertension medical noncompliance right heel wound chronic  Per chart review, pt is currently declining most care today. Pt is refusing medications at this time. RD to attempt to speak with patient at later time. Pt is consuming 50-90% of her meals, is on a CHO modified diet. Pt has been ordered [employer]ost Breeze with lunch only. Pt would benefit from additional protein, would recommend once patient is more open to interventions and medications, provide Prostat BID.   Per weight history, pt has lost 17 lb since November 2016 (12% wt loss x 5 months, significant for time frame). Suspect some degree of malnutrition but unable to diagnose. Will attempt NFPE at follow-up.  Labs reviewed. Medications: Remeron tablet daily  Diet Order:  Diet Carb Modified Fluid consistency:: Thin; Room service appropriate?: Yes  Skin:  Wound (see comment) (DM foot ulcer)  Last BM:  4/26  Height:   Ht Readings from Last 1 Encounters:  01/15/16 5' (1.524 m)    Weight:   Wt Readings from Last 1 Encounters:  01/15/16 123 lb 7.3 oz (56 kg)    Ideal Body Weight:  45.5  kg  BMI:  Body mass index is 24.11 kg/(m^2).  Estimated Nutritional Needs:   Kcal:  1700-1900  Protein:  75-85g  Fluid:  1.9L/day  EDUCATION NEEDS:   No education needs identified at this time  Tilda FrancoLindsey Jada Fass, MS, RD, LDN Pager: [phone] After Hours Pager: [phone]

## 2016-01-17 NOTE — Clinical Social Work Note (Signed)
CSW has received consult for medication needs on patient. CSW has made weekend [facility]. CSW signing off.   Roddie McBryant Terralyn Matsumura MSW, [place]CS[place], [place], [phone]

## 2016-01-18 DIAGNOSIS — L97519 Non-pressure chronic ulcer of other part of right foot with unspecified severity: Secondary | ICD-10-CM

## 2016-01-18 DIAGNOSIS — I350 Nonrheumatic aortic (valve) stenosis: Secondary | ICD-10-CM

## 2016-01-18 DIAGNOSIS — I050 Rheumatic mitral stenosis: Secondary | ICD-10-CM

## 2016-01-18 DIAGNOSIS — E11621 Type 2 diabetes mellitus with foot ulcer: Secondary | ICD-10-CM

## 2016-01-18 LAB — COMPREHENSIVE METABOLIC PANEL
ALT: 12 U/L — ABNORMAL LOW (ref 14–54)
ANION GAP: 9 (ref 5–15)
AST: 16 U/L (ref 15–41)
Albumin: 3.5 g/dL (ref 3.5–5.0)
Alkaline Phosphatase: 60 U/L (ref 38–126)
BUN: 22 mg/dL — ABNORMAL HIGH (ref 6–20)
CHLORIDE: 104 mmol/L (ref 101–111)
CO2: 26 mmol/L (ref 22–32)
Calcium: 9.6 mg/dL (ref 8.9–10.3)
Creatinine, Ser: 0.9 mg/dL (ref 0.44–1.00)
GFR, EST NON AFRICAN AMERICAN: 57 mL/min — AB (ref >60)
Glucose, Bld: 119 mg/dL — ABNORMAL HIGH (ref 65–99)
POTASSIUM: 4 mmol/L (ref 3.5–5.1)
Sodium: 139 mmol/L (ref 135–145)
Total Bilirubin: 0.7 mg/dL (ref 0.3–1.2)
Total Protein: 6.6 g/dL (ref 6.5–8.1)

## 2016-01-18 LAB — GLUCOSE, CAPILLARY
GLUCOSE-CAPILLARY: 108 mg/dL — AB (ref 65–99)
Glucose-Capillary: 111 mg/dL — ABNORMAL HIGH (ref 65–99)
Glucose-Capillary: 111 mg/dL — ABNORMAL HIGH (ref 65–99)

## 2016-01-18 LAB — CBC
HCT: 35.6 % — ABNORMAL LOW (ref 36.0–46.0)
Hemoglobin: 11.5 g/dL — ABNORMAL LOW (ref 12.0–15.0)
MCH: 28.4 pg (ref 26.0–34.0)
MCHC: 32.3 g/dL (ref 30.0–36.0)
MCV: 87.9 fL (ref 78.0–100.0)
PLATELETS: 309 10*3/uL (ref 150–400)
RBC: 4.05 MIL/uL (ref 3.87–5.11)
RDW: 13.9 % (ref 11.5–15.5)
WBC: 14.1 10*3/uL — AB (ref 4.0–10.5)

## 2016-01-18 LAB — APTT: aPTT: 32 seconds (ref 24–37)

## 2016-01-18 LAB — PROTIME-INR
INR: 1.04 (ref 0.00–1.49)
PROTHROMBIN TIME: 13.8 s (ref 11.6–15.2)

## 2016-01-18 LAB — HEMOGLOBIN A1C
Hgb A1c MFr Bld: 5.9 % — ABNORMAL HIGH (ref 4.8–5.6)
Mean Plasma Glucose: 123 mg/dL

## 2016-01-18 LAB — HEPARIN LEVEL (UNFRACTIONATED): Heparin Unfractionated: <0.1 IU/mL — ABNORMAL LOW (ref 0.30–0.70)

## 2016-01-18 MED ORDER — PROCHLORPERAZINE EDISYLATE 5 MG/ML IJ SOLN
5.0000 mg | Freq: Four times a day (QID) | INTRAMUSCULAR | Status: DC | PRN
Start: 2016-01-18 — End: 2016-01-21

## 2016-01-18 MED ORDER — ACETAMINOPHEN-CODEINE #3 300-30 MG PO TABS: 1.0000 | ORAL_TABLET | Freq: Four times a day (QID) | ORAL | Status: DC | PRN

## 2016-01-18 MED ORDER — FAMOTIDINE 20 MG PO TABS
40.0000 mg | ORAL_TABLET | Freq: Every day | ORAL | Status: DC
  Administered 2016-01-18 – 2016-01-21 (×3): 40 mg via ORAL
  Filled 2016-01-18 (×3): qty 2

## 2016-01-18 MED ORDER — PANTOPRAZOLE SODIUM 40 MG PO TBEC
40.0000 mg | DELAYED_RELEASE_TABLET | Freq: Every day | ORAL | Status: DC
  Administered 2016-01-18 – 2016-01-21 (×3): 40 mg via ORAL
  Filled 2016-01-18 (×3): qty 1

## 2016-01-18 MED ORDER — HEPARIN (PORCINE) IN NACL 100-0.45 UNIT/ML-% IJ SOLN
800.0000 [IU]/h | INTRAMUSCULAR | Status: DC
  Administered 2016-01-18: 800 [IU]/h via INTRAVENOUS
  Filled 2016-01-18: qty 250

## 2016-01-18 NOTE — Progress Notes (Signed)
Nursing Note: Pt had HUGE vomitus all over the floor bedside the bed.Pt says she felt nauseated earlier and called. No mention by staff that pt called.Floor cleaned then bed changed. Pt had small amount on covers. Most of it was on floor,red,undigested food,looked like tomatoes and carrots.Pt states she felt much better after she vomitted Pt cleaned and resting quietly in bed.wbb.

## 2016-01-18 NOTE — Progress Notes (Signed)
PT Cancellation Note  Patient Details Name: Jasmine Fernandez MRN: [id number] DOB: 03/23/1929   Cancelled Treatment:    Reason Eval/Treat Not Completed: Medical issues which prohibited therapy Awaiting plan for amputation.   Please re-order after surgery.  Will check back if pt declines surgical option.   Rozella Servello,KATHrine E 01/18/2016, 12:22 PM Zenovia JarredKati Haisley Arens, PT, DPT 01/18/2016 Pager: [phone]

## 2016-01-18 NOTE — Consult Note (Signed)
WOC wound consult note Reason for Consult:Right heel  Unstageable pressure injury Wound type: vascular insufficiency in the presence of a non-healing wound (pressure injury) Pressure Ulcer POA: Yes Measurement: 2.5cm x 6cm mostly adherent black eschar; 30% separation noted revealing a wound covered with necrotic slough.  Wound bed:As described above Drainage (amount, consistency, odor) scant brown Periwound:intact Dressing procedure/placement/frequency: I have asked nursing to implement once daily dressings using betadine swabstick to clean and clean dry gauze dressings to top.  A pressure redistribution heel boot (which can be used on the left following surgery on the right) is provided. WOC nursing team will not follow, but will remain available to this patient, the nursing and medical teams.  Please re-consult if needed. Thanks, Ladona MowLaurie Edon Hoadley, MSN, RN, GNP, Hans EdenCWOCN, CWON-AP, FAAN  Pager# [phone]

## 2016-01-18 NOTE — Progress Notes (Addendum)
Nursing Note: Pt had very small emesis,bright yellow, liquid and saliva.Paged on-call and made aware,wbb

## 2016-01-18 NOTE — Care Management Important Message (Signed)
Important Message  Patient Details IM Letter given to Jasmine Fernandez/Case Manager to present to Patient Name: Jasmine Fernandez MRN: [id number] Date of Birth: 1929/02/02   Medicare Important Message Given:  Yes    Haskell FlirtJamison, Viha Kriegel 01/18/2016, 9:33 AM

## 2016-01-18 NOTE — Progress Notes (Signed)
ANTICOAGULATION CONSULT NOTE - Initial Consult  Pharmacy Consult for heparin Indication: history of DVT  Allergies  Allergen Reactions  . Penicillins Swelling    Has patient had a PCN reaction causing immediate rash, facial/tongue/throat swelling, SOB or lightheadedness with hypotension: YES Has patient had a PCN reaction causing severe rash involving mucus membranes or skin necrosis:no Has patient had a PCN reaction that required hospitalization: no Has patient had a PCN reaction occurring within the last 10 years: No If all of the above answers are "NO", then may proceed with Cephalosporin use.     Patient Measurements: Height: 5' (152.4 cm) Weight: 123 lb 7.3 oz (56 kg) IBW/kg (Calculated) : 45.5 Heparin Dosing Weight: 56kg  Vital Signs: Temp: 98.5 F (36.9 C) (05/01 0449) Temp Source: Oral (05/01 0449) BP: 184/66 mmHg (05/01 0449) Pulse Rate: 75 (05/01 0449)  Labs:  Recent Labs  01/15/16 1730 01/16/16 0324 01/18/16 0334  HGB 11.3* 11.3* 11.5*  HCT 33.7* 34.8* 35.6*  PLT 237 263 309  LABPROT  --   --  13.8  INR  --   --  1.04  CREATININE 0.81 0.85 0.90    Estimated Creatinine Clearance: 36.5 mL/min (by C-G formula based on Cr of 0.9).   Medical History: Past Medical History  Diagnosis Date  . Hypertension   . Goiter   . Aortic stenosis   . Rheumatic fever   . Diabetes mellitus without complication [facility](HCC)     patient states she is not diabetic.    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  She has a R heal wound.  She is to be evaluated for amputation as she is not candidate for re-vascularization.  She is taking xarelto for prior DVT. Plans to bridge with heparin gtt starting at 18:00 5/1 and plans for orthopedics to assess for possible surgery   01/18/2016  Last dose of xarelto 4/29 at 11:55am  CBC: Hgb low/stable  Goal of Therapy:  Heparin level 0.3-0.7 units/ml Monitor platelets by anticoagulation  protocol: Yes   Plan:   Start heparin at 18:00 today (per [facility] to allow time for ortho to evaluate patient) at 800 units/hr  Check 8h heparin level  Daily heparin level and CBC  Awaiting possible plans for surgery  Juliette Alcideustin Otto Caraway, PharmD, BCPS.   Pager: [phone] 01/18/2016 9:42 AM
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
## 2016-01-19 ENCOUNTER — Inpatient Hospital Stay (HOSPITAL_COMMUNITY): Payer: Medicare Other

## 2016-01-19 DIAGNOSIS — I350 Nonrheumatic aortic (valve) stenosis: Secondary | ICD-10-CM

## 2016-01-19 DIAGNOSIS — E1165 Type 2 diabetes mellitus with hyperglycemia: Secondary | ICD-10-CM

## 2016-01-19 DIAGNOSIS — L98499 Non-pressure chronic ulcer of skin of other sites with unspecified severity: Secondary | ICD-10-CM | POA: Insufficient documentation

## 2016-01-19 DIAGNOSIS — L97412 Non-pressure chronic ulcer of right heel and midfoot with fat layer exposed: Secondary | ICD-10-CM

## 2016-01-19 DIAGNOSIS — Z880 Allergy status to penicillin: Secondary | ICD-10-CM

## 2016-01-19 LAB — CBC
HCT: 30.9 % — ABNORMAL LOW (ref 36.0–46.0)
Hemoglobin: 10.3 g/dL — ABNORMAL LOW (ref 12.0–15.0)
MCH: 29.2 pg (ref 26.0–34.0)
MCHC: 33.3 g/dL (ref 30.0–36.0)
MCV: 87.5 fL (ref 78.0–100.0)
PLATELETS: 244 10*3/uL (ref 150–400)
RBC: 3.53 MIL/uL — AB (ref 3.87–5.11)
RDW: 14 % (ref 11.5–15.5)
WBC: 11.2 10*3/uL — AB (ref 4.0–10.5)

## 2016-01-19 LAB — ECHOCARDIOGRAM COMPLETE
HEIGHTINCHES: 60 in
Weight: 1975.32 oz

## 2016-01-19 LAB — GLUCOSE, CAPILLARY
GLUCOSE-CAPILLARY: 155 mg/dL — AB (ref 65–99)
GLUCOSE-CAPILLARY: 131 mg/dL — AB (ref 65–99)
Glucose-Capillary: 135 mg/dL — ABNORMAL HIGH (ref 65–99)

## 2016-01-19 LAB — HEPARIN LEVEL (UNFRACTIONATED)
HEPARIN UNFRACTIONATED: 0.68 [IU]/mL (ref 0.30–0.70)
Heparin Unfractionated: 0.39 IU/mL (ref 0.30–0.70)

## 2016-01-19 MED ORDER — HEPARIN (PORCINE) IN NACL 100-0.45 UNIT/ML-% IJ SOLN
800.0000 [IU]/h | INTRAMUSCULAR | Status: DC
  Administered 2016-01-19: 800 [IU]/h via INTRAVENOUS
  Filled 2016-01-19 (×2): qty 250

## 2016-01-19 MED ORDER — METRONIDAZOLE 500 MG PO TABS
500.0000 mg | ORAL_TABLET | Freq: Three times a day (TID) | ORAL | Status: DC
  Administered 2016-01-19: 500 mg via ORAL
  Filled 2016-01-19: qty 1

## 2016-01-19 NOTE — Progress Notes (Signed)
ANTICOAGULATION CONSULT NOTE - Follow Up Consult  Pharmacy Consult for Heparin Indication: DVT  Allergies  Allergen Reactions  . Penicillins Swelling    Has patient had a PCN reaction causing immediate rash, facial/tongue/throat swelling, SOB or lightheadedness with hypotension: YES Has patient had a PCN reaction causing severe rash involving mucus membranes or skin necrosis:no Has patient had a PCN reaction that required hospitalization: no Has patient had a PCN reaction occurring within the last 10 years: No If all of the above answers are "NO", then may proceed with Cephalosporin use.     Patient Measurements: Height: 5' (152.4 cm) Weight: 123 lb 7.3 oz (56 kg) IBW/kg (Calculated) : 45.5 Heparin Dosing Weight:   Vital Signs: Temp: 98.2 F (36.8 C) (05/01 2217) Temp Source: Oral (05/01 2217) BP: 129/44 mmHg (05/01 2217) Pulse Rate: 59 (05/01 2217)  Labs:  Recent Labs  01/18/16 0334 01/18/16 1548 01/19/16 0130  HGB 11.5*  --  10.3*  HCT 35.6*  --  30.9*  PLT 309  --  244  APTT  --  32  --   LABPROT 13.8  --   --   INR 1.04  --   --   HEPARINUNFRC  --  <0.10* 0.39  CREATININE 0.90  --   --     Estimated Creatinine Clearance: 36.5 mL/min (by C-G formula based on Cr of 0.9).   Medications:  Infusions:  . heparin 800 Units/hr (01/18/16 1826)    Assessment: Patient with heparin level at goal.  No heparin issues per RN.  Goal of Therapy:  Heparin level 0.3-0.7 units/ml Monitor platelets by anticoagulation protocol: Yes   Plan:  Continue heparin drip at current rate Recheck level at 1000  Darlina GuysGrimsley Jr, FreerJulian Crowford 01/19/2016,6:47 AM

## 2016-01-19 NOTE — Consult Note (Signed)
Reason for Consult: Nonhealing R diabetic foot ulcer with concerns for osteomyelitis Referring Physician: Nita Sells, MD   HPI: Jasmine Fernandez is an 82 year old female, with PMH stated below, that was brought to the [facility] ED on 01/15/16 by her daughter with concerns of infection in her right heel.  Patient reports that the wound has been there since 04/2015 and that she has had treatment for it at [facility].  She left the facility AMA over a week ago and the dressing had not been changed since her stay at the SNF.  The patient had increased discharge and malodorous aroma since leaving [facility] to go live with her family. The patient was then admitted to the hospital, started on IV cipro and clindamycin.  Radiographs were obtained that do not demonstrate osteomyelitis.  Ortho was then consulted for definitive evaluation of osteomyelitis.  Cardiology consulted and revascularization is not option.  Patient reports that she has pain on her right with pressure, described as a sharp throbby sensation, and that her pain is relieved when she is NWB.  The patient denies any previous injury or surgery to the foot.  Patient has struggled with short term memory loss and is a poor historian.  The patient was evaluated by a podiatrist in March 2017 and was started on doxycycline.  The patient apparently refused medications.  Her son and daughters are not present upon examination.  She denies autoimmune disorders, cancers, and she is a nonsmoker.    Surgical considerations: Hx of nonhealing diabetic foot ulcer, Right femoral DVT on xarelto, PVD and history of peripheral revascuarization, DM II, history of rheumatic fever and aortic stenosis, HTN, goiter  Past Medical History  Diagnosis Date  . Hypertension   . Goiter   . Aortic stenosis   . Rheumatic fever   . Diabetes mellitus without complication [facility])     patient states she is not diabetic.     Past Surgical History  Procedure Laterality Date  .  Tonsillectomy      During Childhood  . Peripheral vascular catheterization N/A 08/19/2015    Procedure: Lower Extremity Angiography;  Surgeon: Serafina Mitchell, MD;  Location: [place] CV LAB;  Service: Cardiovascular;  Laterality: N/A;    Family History  Problem Relation Age of Onset  . Heart disease Father   . Heart attack Father     Social History:  reports that she has never smoked. She does not have any smokeless tobacco history on file. She reports that she does not drink alcohol or use illicit drugs.  Allergies:  Allergies  Allergen Reactions  . Penicillins Swelling    Has patient had a PCN reaction causing immediate rash, facial/tongue/throat swelling, SOB or lightheadedness with hypotension: YES Has patient had a PCN reaction causing severe rash involving mucus membranes or skin necrosis:no Has patient had a PCN reaction that required hospitalization: no Has patient had a PCN reaction occurring within the last 10 years: No If all of the above answers are "NO", then may proceed with Cephalosporin use.     Medications: I have reviewed the patient's medication list that she takes at home, and is currently on during this admission.  Results for orders placed or performed during the hospital encounter of 01/15/16 (from the past 48 hour(s))  Glucose, capillary     Status: Abnormal   Collection Time: 01/17/16  4:43 PM  Result Value Ref Range   Glucose-Capillary 105 (H) 65 - 99 mg/dL  Glucose, capillary  Status: None   Collection Time: 01/17/16  9:23 PM  Result Value Ref Range   Glucose-Capillary 90 65 - 99 mg/dL  CBC     Status: Abnormal   Collection Time: 01/18/16  3:34 AM  Result Value Ref Range   WBC 14.1 (H) 4.0 - 10.5 K/uL   RBC 4.05 3.87 - 5.11 MIL/uL   Hemoglobin 11.5 (L) 12.0 - 15.0 g/dL   HCT 35.6 (L) 36.0 - 46.0 %   MCV 87.9 78.0 - 100.0 fL   MCH 28.4 26.0 - 34.0 pg   MCHC 32.3 30.0 - 36.0 g/dL   RDW 13.9 11.5 - 15.5 %   Platelets 309 150 - 400 K/uL   Comprehensive metabolic panel     Status: Abnormal   Collection Time: 01/18/16  3:34 AM  Result Value Ref Range   Sodium 139 135 - 145 mmol/L   Potassium 4.0 3.5 - 5.1 mmol/L   Chloride 104 101 - 111 mmol/L   CO2 26 22 - 32 mmol/L   Glucose, Bld 119 (H) 65 - 99 mg/dL   BUN 22 (H) 6 - 20 mg/dL   Creatinine, Ser 0.90 0.44 - 1.00 mg/dL   Calcium 9.6 8.9 - 10.3 mg/dL   Total Protein 6.6 6.5 - 8.1 g/dL   Albumin 3.5 3.5 - 5.0 g/dL   AST 16 15 - 41 U/L   ALT 12 (L) 14 - 54 U/L   Alkaline Phosphatase 60 38 - 126 U/L   Total Bilirubin 0.7 0.3 - 1.2 mg/dL   GFR calc non Af Amer 57 (L) >60 mL/min   GFR calc Af Amer >60 >60 mL/min    Comment: (NOTE) The eGFR has been calculated using the CKD EPI equation. This calculation has not been validated in all clinical situations. eGFR's persistently <60 mL/min signify possible Chronic Kidney Disease.    Anion gap 9 5 - 15  Protime-INR     Status: None   Collection Time: 01/18/16  3:34 AM  Result Value Ref Range   Prothrombin Time 13.8 11.6 - 15.2 seconds   INR 1.04 0.00 - 1.49  Glucose, capillary     Status: Abnormal   Collection Time: 01/18/16  7:57 AM  Result Value Ref Range   Glucose-Capillary 111 (H) 65 - 99 mg/dL   Comment 1 Notify RN    Comment 2 Document in Chart   Glucose, capillary     Status: Abnormal   Collection Time: 01/18/16 12:10 PM  Result Value Ref Range   Glucose-Capillary 111 (H) 65 - 99 mg/dL  APTT     Status: None   Collection Time: 01/18/16  3:48 PM  Result Value Ref Range   aPTT 32 24 - 37 seconds  Heparin level (unfractionated)     Status: Abnormal   Collection Time: 01/18/16  3:48 PM  Result Value Ref Range   Heparin Unfractionated <0.10 (L) 0.30 - 0.70 IU/mL    Comment:        IF HEPARIN RESULTS ARE BELOW EXPECTED VALUES, AND PATIENT DOSAGE HAS BEEN CONFIRMED, SUGGEST FOLLOW UP TESTING OF ANTITHROMBIN III LEVELS.   Glucose, capillary     Status: Abnormal   Collection Time: 01/18/16  5:33 PM  Result  Value Ref Range   Glucose-Capillary 108 (H) 65 - 99 mg/dL   Comment 1 Notify RN    Comment 2 Document in Chart   Glucose, capillary     Status: Abnormal   Collection Time: 01/18/16 10:29 PM  Result Value  Ref Range   Glucose-Capillary 155 (H) 65 - 99 mg/dL   Comment 1 Notify RN   Heparin level (unfractionated)     Status: None   Collection Time: 01/19/16  1:30 AM  Result Value Ref Range   Heparin Unfractionated 0.39 0.30 - 0.70 IU/mL    Comment:        IF HEPARIN RESULTS ARE BELOW EXPECTED VALUES, AND PATIENT DOSAGE HAS BEEN CONFIRMED, SUGGEST FOLLOW UP TESTING OF ANTITHROMBIN III LEVELS.   CBC     Status: Abnormal   Collection Time: 01/19/16  1:30 AM  Result Value Ref Range   WBC 11.2 (H) 4.0 - 10.5 K/uL   RBC 3.53 (L) 3.87 - 5.11 MIL/uL   Hemoglobin 10.3 (L) 12.0 - 15.0 g/dL   HCT 78.2 (L) 96.0 - 39.0 %   MCV 87.5 78.0 - 100.0 fL   MCH 29.2 26.0 - 34.0 pg   MCHC 33.3 30.0 - 36.0 g/dL   RDW 56.4 69.8 - 06.0 %   Platelets 244 150 - 400 K/uL  Glucose, capillary     Status: Abnormal   Collection Time: 01/19/16  8:22 AM  Result Value Ref Range   Glucose-Capillary 131 (H) 65 - 99 mg/dL   Comment 1 Notify RN    Comment 2 Document in Chart   Heparin level (unfractionated)     Status: None   Collection Time: 01/19/16  9:49 AM  Result Value Ref Range   Heparin Unfractionated 0.68 0.30 - 0.70 IU/mL    Comment:        IF HEPARIN RESULTS ARE BELOW EXPECTED VALUES, AND PATIENT DOSAGE HAS BEEN CONFIRMED, SUGGEST FOLLOW UP TESTING OF ANTITHROMBIN III LEVELS.     No results found.  ROS:   Gen: Denies fever, chills, weight change, fatigue, night sweats MSK: Pain on Right heel with walking and pressure Neuro: Denies headache, numbness, weakness, slurred speech, loss of memory or consciousness Derm: Black scab on R heel. HEENT: Denies blurred vision, double vision, neck stiffness, dysphagia PULM: Denies shortness of breath, cough, sputum production, hemoptysis, wheezing CV:  Denies chest pain, edema, orthopnea, palpitations GI: Denies abdominal pain, nausea, vomiting, diarrhea, hematochezia, melena, constipation  Endocrine: Denies hot or cold intolerance, polyuria, polyphagia or appetite change Heme: Denies easy bruising, bleeding, bleeding gums  PE:  Blood pressure 133/40, pulse 67, temperature 99 F (37.2 C), temperature source Oral, resp. rate 16, height 5' (1.524 m), weight 56 kg (123 lb 7.3 oz), SpO2 97 %. General: WDWN patient in NAD. Psych:  Appropriate mood and affect. Neuro:  A&O x 3, Moving all extremities, sensation intact to light touch HEENT:  EOMs intact Chest:  Even non-labored respirations Skin: R heel stage 3 ulcer with escar cap 3 cm in diameter.  A sterile probe was utilized to penetrate the lateral border of the escar and was penetrated 0.5cm, however bone was not visible or felt during probing.  Sterile dressing was reapplied to wound. Extremities: warm/dry, no edema, mild erythema around escar cap, no echymosis.  No lymphadenopathy.  Trophic nail changes noted. Pulses: Dorsalis pedis and post tibialis 1+ MSK:  ROM: EHL/FHL intact, full knee ROM, MMT: patient is able to perform quad set   Assessment/Plan: R heel stage 3 diabetic ulcer R foot Cellulitis  Due to the fact that radiographs were negative for osteomyelitis, there was no exposed bone, and I was unable to probe to bone the patient can be treated nonoperatively with continued wound care and ABX.  I will discuss  with Dr. Doran Durand, but unless bone is involved there is no need for BKA at this time.  With the patient's PMH of PVD and DM there is a risk that this wound may progress and need for BKA is indicated in the future.  The patient was informed of this possibilty.  Dr. Doran Durand will call and inform patient's son. Continue with soft pillow heel protector. ABX per Medicine team.  Jasmine Claude, PA-C, [facility] Office:  [phone]

## 2016-01-19 NOTE — Progress Notes (Signed)
Echocardiogram 2D Echocardiogram has been performed.  Jasmine Fernandez, Jasmine Fernandez 01/19/2016, 1:47 PM

## 2016-01-19 NOTE — Progress Notes (Addendum)
PROGRESS NOTE    Jasmine Fernandez  WUJ:[id number] DOB: 10-10-1928 DOA: 01/15/2016 PCP: Kimber Relic, MD  Outpatient Specialists:  Reyes Ivan, DPM at [facility], [place]. [address] Dr. Hart [place], [facility]  Brief Narrative:  2 ? Complex vascular history -history complete left-sided RAS stenosis - R SFA occlusion, femoral DVT + Lovenox 06/2015  -Seen in the emergency room 07/2015-noted to have chronic nonhealing ulcer then 3 cm diameter -Had angiogram to determine if PTA/stenting R SFA feasible-NOT operative candidate per Dr. Candie Chroman note  stage III left L gluteal wound10/07/2015 status post fall,? LOC aortic valve stenosis + rheumatic fever history as a child Memory deficit, dementia  right femoral DVT currently on Xarelto Goiter  Dysthymia, depression    recently allegedly left AMA [facility]  Admitted 4.28.17 with sepsis, worsening R Heel wound  On admission white count 13, hemoglobin 11, platelet 263, sedimentation rate 17 CMET wnl Foot Xray-no evidence Osteo  [facility] and Ortho consulted  Will need amputation most liekly after consensus discussionw ith ID/Ortho Dr. Garey Ham    Assessment & Plan:   Active Problems:   Hypertension   Diabetic foot ulcer (HCC)   Diabetes mellitus (HCC)   Hypokalemia   Cellulitis   Foot ulcer (HCC)   Diabetic foot ulcer associated with type 2 diabetes mellitus (HCC)  Chronic right heel ulcer -Discussed in detail with Dr. Imogene Burn, went over angiogram results from 11 2016 -Amputation only operative option-no chances for re-vascularization -have d/w patient-she is agreeable to procedure after much discussion with her son presetn in room 4/30-"I ant a 2nd opinion"--Dr. Madelin Rear had been made aware prior to her request.  -Id consulted if she absolutely refuses to have admissison for duration of Abx -1/2 Bc growing "Gram variable rods" -Patient tentatively scheduled for OR  time 01/21/2016 12:15 PM under Dr. Victorino Dike who will perform BKA if patient is agreeable to the same -Patient will need lateral transfer to [facility] for this to occur -Continue IV antibiotics Cipro, Clinda-discontinue doxycycline for now -d/c Norco  4 when necessary pain--somnlent and nausea today 5.1.  Use tylenol #3   Diabetes mellitus type 2 -Patient has stated in the past "I'm not diabetic" -blood sugars ranging from 131-135 -Note history of documented noncompliance with medications in the outpatient setting-skilled facility  Hypertension -Continue losartan 25 daily -continue atenolol 50 daily  Stage III gluteal wound - continue Santyl twice a day to wound  History DVT right side -Continue Xarelto 20 daily -transition to heparin gtt 5/1 -We'll continue on heparin until patient and family finalize decision making regarding amputation  Moderate protein energy malnutrition -Give boost supplement 1 container daily lunch  Mod-severe AoS -critical stenosis based on echo 01/19/16 Very poor candidate for any type of operative management of her valve -She has chronic underlying infection which needs to be addressed first Diastolic heart failure-compensated and nonacute PASP 31 mm hg   Reflux -Continue Pepcid 20 daily when necessary -given nausea have increased this PEpcid-p>40 -add protonix 40 and re-eval in am   prophylaxis: anticoagulated Code Status: Full Family Communication: -Have called both S1 Oswald Hillock as well as daughter-no answer on telephone. I will try again this evening. Disposition Plan: suspect this admission patient will need amputation.  Consultants:   Dr Chen-[facility]-telephone consult  Dr. Kelly-cardiology  Dr. Victorino Dike, Dr. Marzetta Board  Dr. Zenaida Niece dam-ID  Procedures:     Antimicrobials:  Cefepime 4/28 Ciprofloxacin/28--> Clindamycin/28--> Doxycycline/28-/29    Subjective:  In better spirits today Absolutely refuses to consider  transfer back to skilled facility and wants to go home Seems happy that she does not appear to need surgery as per orthopedics PA input however subsequent to this discussion-I have had a discussion with both Dr. Victorino DikeHewitt and Dr. Zenaida NieceVan dam concur that her wound has extremely poor chances of healing without surgery  Otherwise tolerating diet   Objective: Filed Vitals:   01/18/16 1510 01/18/16 2217 01/19/16 0711 01/19/16 1402  BP: 148/57 129/44 133/40 129/41  Pulse: 70 59 67 61  Temp: 97.7 F (36.5 C) 98.2 F (36.8 C) 99 F (37.2 C) 98.5 F (36.9 C)  TempSrc: Oral Oral Oral Oral  Resp: 16 16 16 16   Height:      Weight:      SpO2: 100% 98% 97% 99%    Intake/Output Summary (Last 24 hours) at 01/19/16 1510 Last data filed at 01/19/16 0825  Gross per 24 hour  Intake    240 ml  Output      0 ml  Net    240 ml   Filed Weights   01/15/16 2313  Weight: 56 kg (123 lb 7.3 oz)    Examination:  General exam: Appears calm , mod dentition Respiratory system: Clear to auscultation. Respiratory effort normal. Cardiovascular system: S1 & S2 heard, 4/6 HSM- no radiation Gastrointestinal system: Abdomen is nondistended, soft and nontender.  Central nervous system: Alert and oriented. No focal neurological deficits. Extremities: Symmetric 5 x 5 power. Skin: No rashes, lesions or ulcers Psychiatry: Calm    Data Reviewed: I have personally reviewed following labs and imaging studies  CBC:  Recent Labs Lab 01/15/16 1730 01/16/16 0324 01/18/16 0334 01/19/16 0130  WBC 12.2* 13.2* 14.1* 11.2*  NEUTROABS 9.5*  --   --   --   HGB 11.3* 11.3* 11.5* 10.3*  HCT 33.7* 34.8* 35.6* 30.9*  MCV 85.5 88.3 87.9 87.5  PLT 237 263 309 244   Basic Metabolic Panel:  Recent Labs Lab 01/15/16 1730 01/16/16 0324 01/18/16 0334  NA 142 143 139  K 3.3* 3.5 4.0  CL 105 108 104  CO2 27 27 26   GLUCOSE 135* 74 119*  BUN 12 10 22*  CREATININE 0.81 0.85 0.90  CALCIUM 9.6 9.2 9.6  MG  --  2.0  --     PHOS  --  3.0  --    GFR: Estimated Creatinine Clearance: 36.5 mL/min (by C-G formula based on Cr of 0.9). Liver Function Tests:  Recent Labs Lab 01/15/16 1730 01/16/16 0324 01/18/16 0334  AST 22 17 16   ALT 14 12* 12*  ALKPHOS 67 60 60  BILITOT 0.4 0.6 0.7  PROT 7.0 6.7 6.6  ALBUMIN 3.8 3.6 3.5   No results for input(s): LIPASE, AMYLASE in the last 168 hours. No results for input(s): AMMONIA in the last 168 hours. Coagulation Profile:  Recent Labs Lab 01/18/16 0334  INR 1.04   Cardiac Enzymes: No results for input(s): CKTOTAL, CKMB, CKMBINDEX, TROPONINI in the last 168 hours. BNP (last 3 results) No results for input(s): PROBNP in the last 8760 hours. HbA1C: No results for input(s): HGBA1C in the last 72 hours. CBG:  Recent Labs Lab 01/18/16 1210 01/18/16 1733 01/18/16 2229 01/19/16 0822 01/19/16 1402  GLUCAP 111* 108* 155* 131* 135*   Lipid Profile: No results for input(s): CHOL, HDL, LDLCALC, TRIG, CHOLHDL, LDLDIRECT in the last 72 hours. Thyroid Function Tests: No results for input(s): TSH, T4TOTAL, FREET4, T3FREE, THYROIDAB in  the last 72 hours. Anemia Panel: No results for input(s): VITAMINB12, FOLATE, FERRITIN, TIBC, IRON, RETICCTPCT in the last 72 hours. Urine analysis:    Component Value Date/Time   COLORURINE YELLOW 01/15/2016 2023   APPEARANCEUR CLEAR 01/15/2016 2023   LABSPEC 1.012 01/15/2016 2023   PHURINE 7.0 01/15/2016 2023   GLUCOSEU NEGATIVE 01/15/2016 2023   HGBUR NEGATIVE 01/15/2016 2023   BILIRUBINUR NEGATIVE 01/15/2016 2023   KETONESUR NEGATIVE 01/15/2016 2023   PROTEINUR NEGATIVE 01/15/2016 2023   UROBILINOGEN 0.2 06/29/2015 1842   NITRITE NEGATIVE 01/15/2016 2023   LEUKOCYTESUR NEGATIVE 01/15/2016 2023   Recent Results (from the past 240 hour(s))  Culture, blood (Routine x 2)     Status: None (Preliminary result)   Collection Time: 01/15/16  5:31 PM  Result Value Ref Range Status   Specimen Description LEFT ANTECUBITAL   Final   Special Requests BOTTLES DRAWN AEROBIC AND ANAEROBIC 5CC  Final   Culture   Final    NO GROWTH 4 DAYS Performed at [facility]    Report Status PENDING  Incomplete  Culture, blood (Routine x 2)     Status: None (Preliminary result)   Collection Time: 01/15/16  6:16 PM  Result Value Ref Range Status   Specimen Description BLOOD RIGHT ANTECUBITAL  Final   Special Requests BOTTLES DRAWN AEROBIC AND ANAEROBIC 5 ML  Final   Culture  Setup Time   Final    GRAM VARIABLE ROD AEROBIC BOTTLE ONLY CRITICAL RESULT CALLED TO, READ BACK BY AND VERIFIED WITH: F. HERNDON,RN AT 1518 ON [id number] BY Lucienne Capers    Culture   Final    GRAM VARIABLE ROD CULTURE REINCUBATED FOR BETTER GROWTH Performed at [facility]    Report Status PENDING  Incomplete  Urine culture     Status: Abnormal   Collection Time: 01/15/16  8:23 PM  Result Value Ref Range Status   Specimen Description URINE, CLEAN CATCH  Final   Special Requests NONE  Final   Culture MULTIPLE SPECIES PRESENT, SUGGEST RECOLLECTION (A)  Final   Report Status 01/17/2016 FINAL  Final  MRSA PCR Screening     Status: Abnormal   Collection Time: 01/16/16 12:41 AM  Result Value Ref Range Status   MRSA by PCR POSITIVE (A) NEGATIVE Final    Comment:        The GeneXpert MRSA Assay (FDA approved for NASAL specimens only), is one component of a comprehensive MRSA colonization surveillance program. It is not intended to diagnose MRSA infection nor to guide or monitor treatment for MRSA infections. RESULT CALLED TO, READ BACK BY AND VERIFIED WITH: Karlene Lineman AT 0609 ON 04.29.2017 BY NBROOKS          Radiology Studies: No results found.  Scheduled Meds: . atenolol  50 mg Oral Daily  . Chlorhexidine Gluconate Cloth  6 each Topical Q0600  . ciprofloxacin  400 mg Intravenous Q12H  . collagenase  1 application Topical BID  . famotidine  40 mg Oral Daily  . feeding supplement  1 Container Oral QPC lunch  . insulin  aspart  0-5 Units Subcutaneous QHS  . insulin aspart  0-9 Units Subcutaneous TID WC  . losartan  25 mg Oral Daily  . metroNIDAZOLE  500 mg Oral Q8H  . mupirocin ointment  1 application Nasal BID  . pantoprazole  40 mg Oral Daily  . polyethylene glycol  17 g Oral Daily  . saccharomyces boulardii  250 mg Oral BID  . sodium  chloride flush  3 mL Intravenous Q12H   Continuous Infusions: . heparin 800 Units/hr (01/18/16 1826)     LOS: 4 days    Time spent: 73    Pleas Koch, MD [facility]   If 7PM-7AM, please contact night-coverage [email or web address] Password [email or web address] 01/19/2016, 3:10 PM
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
## 2016-01-19 NOTE — Progress Notes (Signed)
Pt became agitated, removed the iv from her L arm.. Still, she preserved her iv at her right arm, but refused a second iv start for her heparin. MD paged, okay'd her heparin to be paused for one hour for each dose of Cipro.

## 2016-01-19 NOTE — Progress Notes (Signed)
Physical Therapy Discharge Patient Details Name: Jasmine Fernandez MRN: [id number] DOB: Apr 21, 1929 Today's Date: 01/19/2016 Time:  -     Patient discharged from PT services secondary to surgery - will need to re-order PT to resume therapy services.per  [name],  Work up for surgical amputation, not yet scheduled.   GP    [name] Jewel PT [phone]  [name], [name] [name] 01/19/2016, 7:37 AM

## 2016-01-19 NOTE — Progress Notes (Signed)
ANTICOAGULATION CONSULT NOTE - Initial Consult  Pharmacy Consult for heparin Indication: history of DVT  Allergies  Allergen Reactions  . Penicillins Swelling    Has patient had a PCN reaction causing immediate rash, facial/tongue/throat swelling, SOB or lightheadedness with hypotension: YES Has patient had a PCN reaction causing severe rash involving mucus membranes or skin necrosis:no Has patient had a PCN reaction that required hospitalization: no Has patient had a PCN reaction occurring within the last 10 years: No If all of the above answers are "NO", then may proceed with Cephalosporin use.     Patient Measurements: Height: 5' (152.4 cm) Weight: 123 lb 7.3 oz (56 kg) IBW/kg (Calculated) : 45.5 Heparin Dosing Weight: 56kg  Vital Signs: Temp: 99 F (37.2 C) (05/02 0711) Temp Source: Oral (05/02 0711) BP: 133/40 mmHg (05/02 0711) Pulse Rate: 67 (05/02 0711)  Labs:  Recent Labs  01/18/16 0334 01/18/16 1548 01/19/16 0130 01/19/16 0949  HGB 11.5*  --  10.3*  --   HCT 35.6*  --  30.9*  --   PLT 309  --  244  --   APTT  --  32  --   --   LABPROT 13.8  --   --   --   INR 1.04  --   --   --   HEPARINUNFRC  --  <0.10* 0.39 0.68  CREATININE 0.90  --   --   --     Estimated Creatinine Clearance: 36.5 mL/min (by C-G formula based on Cr of 0.9).   Medical History: Past Medical History  Diagnosis Date  . Hypertension   . Goiter   . Aortic stenosis   . Rheumatic fever   . Diabetes mellitus without complication [facility](HCC)     patient states she is not diabetic.    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  She has a R heel wound.  She is to be evaluated for amputation as she is not candidate for re-vascularization.  She is taking xarelto for prior DVT. Plans to bridge with heparin gtt starting at 18:00 5/1 and plans for orthopedics to assess for possible surgery   Baseline INR, aPTT: both normal (likely d/t noncompliance  w/ Xarelto)  Prior anticoagulation: Xarelto 20 mg daily  Significant events: Seen by ortho, who feels that medical management is still reasonable at this time  Today, 01/19/2016:  CBC: Hgb sl low; plt wnl  Most recent heparin level therapeutic but borderline high on 800 units/hr  No bleeding or infusion issues per nursing  CrCl 37 ml/min (low mostly d/t age)  Goal of Therapy: Heparin level 0.3-0.7 units/ml Monitor platelets by anticoagulation protocol: Yes  Plan:  Continue heparin IV infusion at 800 units/hr.  Will not recheck level until AM labs as I am not certain level will continue to rise.  Also, I anticipate transition back to Xarelto possibly today if surgery is ruled out.  Heparin should be continued until dose of Xarelto given, then pump turned off  Daily CBC, daily heparin level once stable  Monitor for signs of bleeding or thrombosis   Bernadene Personrew Oziel Beitler, PharmD Pager: [phone] 01/19/2016, 1:53 PM

## 2016-01-20 ENCOUNTER — Other Ambulatory Visit: Payer: Self-pay | Admitting: Orthopedic Surgery

## 2016-01-20 ENCOUNTER — Encounter (HOSPITAL_COMMUNITY): Payer: Medicare PPO

## 2016-01-20 LAB — CBC
HCT: 30.1 % — ABNORMAL LOW (ref 36.0–46.0)
Hemoglobin: 9.8 g/dL — ABNORMAL LOW (ref 12.0–15.0)
MCH: 27.8 pg (ref 26.0–34.0)
MCHC: 32.6 g/dL (ref 30.0–36.0)
MCV: 85.5 fL (ref 78.0–100.0)
PLATELETS: 255 10*3/uL (ref 150–400)
RBC: 3.52 MIL/uL — ABNORMAL LOW (ref 3.87–5.11)
RDW: 13.7 % (ref 11.5–15.5)
WBC: 11.6 10*3/uL — AB (ref 4.0–10.5)

## 2016-01-20 LAB — C-REACTIVE PROTEIN

## 2016-01-20 LAB — BASIC METABOLIC PANEL
ANION GAP: 7 (ref 5–15)
BUN: 17 mg/dL (ref 6–20)
CALCIUM: 9.1 mg/dL (ref 8.9–10.3)
CO2: 27 mmol/L (ref 22–32)
Chloride: 107 mmol/L (ref 101–111)
Creatinine, Ser: 1.03 mg/dL — ABNORMAL HIGH (ref 0.44–1.00)
GFR, EST AFRICAN AMERICAN: 56 mL/min — AB (ref >60)
GFR, EST NON AFRICAN AMERICAN: 49 mL/min — AB (ref >60)
GLUCOSE: 127 mg/dL — AB (ref 65–99)
Potassium: 4 mmol/L (ref 3.5–5.1)
Sodium: 141 mmol/L (ref 135–145)

## 2016-01-20 LAB — HEPARIN LEVEL (UNFRACTIONATED): HEPARIN UNFRACTIONATED: 0.63 [IU]/mL (ref 0.30–0.70)

## 2016-01-20 LAB — GLUCOSE, CAPILLARY
GLUCOSE-CAPILLARY: 133 mg/dL — AB (ref 65–99)
Glucose-Capillary: 110 mg/dL — ABNORMAL HIGH (ref 65–99)
Glucose-Capillary: 93 mg/dL (ref 65–99)

## 2016-01-20 LAB — SEDIMENTATION RATE: Sed Rate: 30 mm/hr — ABNORMAL HIGH (ref 0–22)

## 2016-01-20 LAB — PREALBUMIN: Prealbumin: 15.4 mg/dL — ABNORMAL LOW (ref 18–38)

## 2016-01-20 LAB — CULTURE, BLOOD (ROUTINE X 2): CULTURE: NO GROWTH

## 2016-01-20 LAB — HIV ANTIBODY (ROUTINE TESTING W REFLEX): HIV Screen 4th Generation wRfx: NONREACTIVE

## 2016-01-20 MED ORDER — RIVAROXABAN 20 MG PO TABS: 20.0000 mg | ORAL_TABLET | Freq: Every day | ORAL | Status: DC

## 2016-01-20 MED ORDER — DOXYCYCLINE HYCLATE 100 MG PO TABS
100.0000 mg | ORAL_TABLET | Freq: Two times a day (BID) | ORAL | Status: DC
  Administered 2016-01-20 – 2016-01-21 (×2): 100 mg via ORAL
  Filled 2016-01-20 (×2): qty 1

## 2016-01-20 MED ORDER — DOXYCYCLINE HYCLATE 100 MG PO TABS: 100.0000 mg | ORAL_TABLET | Freq: Two times a day (BID) | ORAL | Status: DC

## 2016-01-20 NOTE — Progress Notes (Addendum)
  Per review of the patient's chart, she has severe AS and would be high-risk for a surgical procedure. Appears her radiographs are negative for osteomyelitis and initial non-operative management with continued wound care care and antiobiotic therapy per Infectious Disease was recommended.  Can arrange for AVR consultation as an outpatient once she is closer to discharge.  Signed, Ellsworth LennoxBrittany M Strader, PA-C 01/20/2016, 9:54 AM Pager: [phone]  Patient seen and examined. Agree with assessment and plan. Pt has critical AS with AVA 0.51 cm2 with mean gradient of 42 and peak 73 mm HG.  I reviewed with patient the symptoms of AS and consideration for valve surgery if infection heals.   Lennette Biharihomas A. Kelly, MD, [facility] 01/20/2016 2:37 PM

## 2016-01-20 NOTE — Progress Notes (Signed)
ANTICOAGULATION CONSULT NOTE   Pharmacy Consult for Xarelto Indication: history of DVT  Patient Measurements: Height: 5' (152.4 cm) Weight: 123 lb 7.3 oz (56 kg) IBW/kg (Calculated) : 45.5 Heparin Dosing Weight: 56kg  Vital Signs: Temp: 98.1 F (36.7 C) (05/03 0615) Temp Source: Oral (05/03 0615) BP: 137/42 mmHg (05/03 0615) Pulse Rate: 62 (05/03 0615)  Labs:  Recent Labs  01/18/16 0334  01/18/16 1548 01/19/16 0130 01/19/16 0949 01/20/16 0329  HGB 11.5*  --   --  10.3*  --  9.8*  HCT 35.6*  --   --  30.9*  --  30.1*  PLT 309  --   --  244  --  255  APTT  --   --  32  --   --   --   LABPROT 13.8  --   --   --   --   --   INR 1.04  --   --   --   --   --   HEPARINUNFRC  --   < > <0.10* 0.39 0.68 0.63  CREATININE 0.90  --   --   --   --  1.03*  < > = values in this interval not displayed.  Estimated Creatinine Clearance: 31.9 mL/min (by C-G formula based on Cr of 1.03).   Medical History: Past Medical History  Diagnosis Date  . Hypertension   . Goiter   . Aortic stenosis   . Rheumatic fever   . Diabetes mellitus without complication [facility](HCC)     patient states she is not diabetic.    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  She has a R heel wound.  She is to be evaluated for amputation as she is not candidate for re-vascularization.  She is taking xarelto for prior DVT. Plans to bridge with heparin gtt prior to possible amputation.  Since the pt is refusing amputation and refusing heparin, Pharmacy is consulted to resume Xarelto.  Today, 01/20/2016:  SCr 1.03 with CrCl ~ 32 ml/min  (CrCl must remain > 30 ml/min for Xarelto use)  CBC: low (slow trend down) low; plt wnl  No reports of bleeding  Goal of Therapy: Monitor platelets by anticoagulation protocol: Yes  Plan:  D/C heparin IV infusion (pt has been refusing heparin on 5/3 per RN and IV has not been running since this morning)  Resume Xarelto 20mg   daily.  Monitor renal function and for signs of bleeding or thrombosis   Lynann Beaverhristine Ayeshia Coppin PharmD, BCPS Pager [phone] 01/20/2016 5:27 PM

## 2016-01-20 NOTE — Progress Notes (Signed)
PT Cancellation Note  Patient Details Name: Jasmine Fernandez MRN: [id number] DOB: 22-Mar-1929   Cancelled Treatment:    Reason Eval/Treat Not Completed: Patient declined, no reason specified (pt adamantly refused PT, RN stated pt is refusing all care. Observed pt ambulating in room with rollator, she appeared steady. Will sign off due to pt refusal. )   Tamala SerUhlenberg, [place] Klabunde Kistler 01/20/2016, 10:20 AM [phone]

## 2016-01-20 NOTE — Progress Notes (Signed)
ANTICOAGULATION CONSULT NOTE   Pharmacy Consult for heparin Indication: history of DVT  Allergies  Allergen Reactions  . Penicillins Swelling    Has patient had a PCN reaction causing immediate rash, facial/tongue/throat swelling, SOB or lightheadedness with hypotension: YES Has patient had a PCN reaction causing severe rash involving mucus membranes or skin necrosis:no Has patient had a PCN reaction that required hospitalization: no Has patient had a PCN reaction occurring within the last 10 years: No If all of the above answers are "NO", then may proceed with Cephalosporin use.     Patient Measurements: Height: 5' (152.4 cm) Weight: 123 lb 7.3 oz (56 kg) IBW/kg (Calculated) : 45.5 Heparin Dosing Weight: 56kg  Vital Signs: Temp: 98.1 F (36.7 C) (05/03 0615) Temp Source: Oral (05/03 0615) BP: 137/42 mmHg (05/03 0615) Pulse Rate: 62 (05/03 0615)  Labs:  Recent Labs  01/18/16 0334  01/18/16 1548 01/19/16 0130 01/19/16 0949 01/20/16 0329  HGB 11.5*  --   --  10.3*  --  9.8*  HCT 35.6*  --   --  30.9*  --  30.1*  PLT 309  --   --  244  --  255  APTT  --   --  32  --   --   --   LABPROT 13.8  --   --   --   --   --   INR 1.04  --   --   --   --   --   HEPARINUNFRC  --   < > <0.10* 0.39 0.68 0.63  CREATININE 0.90  --   --   --   --  1.03*  < > = values in this interval not displayed.  Estimated Creatinine Clearance: 31.9 mL/min (by C-G formula based on Cr of 1.03).   Medical History: Past Medical History  Diagnosis Date  . Hypertension   . Goiter   . Aortic stenosis   . Rheumatic fever   . Diabetes mellitus without complication [facility](HCC)     patient states she is not diabetic.    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  She has a R heel wound.  She is to be evaluated for amputation as she is not candidate for re-vascularization.  She is taking xarelto for prior DVT. Plans to bridge with heparin gtt starting at  18:00 5/1 and plans for orthopedics to assess for possible surgery   Baseline INR, aPTT: both normal (likely d/t noncompliance w/ Xarelto)  Prior anticoagulation: Xarelto 20 mg daily  Significant events: Seen by ortho, who feels that medical management is still reasonable at this time  Today, 01/20/2016:  Heparin level remains therapeutic on 800 units/hr  CBC: low (slow trend down) low; plt wnl  No reports of bleeding  Goal of Therapy: Heparin level 0.3-0.7 units/ml Monitor platelets by anticoagulation protocol: Yes  Plan:  Continue heparin IV infusion at 800 units/hr.  Awaiting final decision re: possible surgery.  ID has seen patient and ordered MRI to r/o osteomyelitis.   Daily CBC, daily heparin level  Monitor for signs of bleeding or thrombosis  Juliette Alcideustin Zeigler, PharmD, BCPS.   Pager: [phone] 01/20/2016 7:44 AM

## 2016-01-20 NOTE — Discharge Summary (Addendum)
Physician Discharge Summary  Jasmine Fernandez ZHY:[id number] DOB: 12/12/1928 DOA: 01/15/2016  PCP: Kimber RelicGREEN, ARTHUR G, MD  Admit date: 01/15/2016 Discharge date: 01/20/2016  Time spent: 20 minutes  Recommendations for Outpatient Follow-up:  1. Patient is at extremely high risk for readmission given her poor decision making regarding foot ulcer and imminent need for amputation. This is been discussed with patient multiple times by multiple providers inclusive of orthopedics-2 separate providers, I did have a discussion with Dr. Imogene Burnhen of vascular regarding this and ID also had the same opinion 2. She has severe aortic stenosis and may benefit from Tavr however this is very unlikely as the patient refuses to have her chronic source of infected foot treated surgically  Discharge Diagnoses:  Active Problems:   Hypertension   Diabetic foot ulcer (HCC)   Diabetes mellitus (HCC)   Hypokalemia   Cellulitis   Foot ulcer (HCC)   Diabetic foot ulcer associated with type 2 diabetes mellitus (HCC)   Severe aortic stenosis   Non-healing ulcer (HCC)   Discharge Condition: Extremely guarded  Diet recommendation: Diabetic  Filed Weights   01/15/16 2313  Weight: 56 kg (123 lb 7.3 oz)    History of present illness:  1984 ? Complex vascular history -history complete left-sided RAS stenosis - R SFA occlusion, femoral DVT + Lovenox 06/2015  -Seen in the emergency room 07/2015-noted to have chronic nonhealing ulcer then 3 cm diameter -Had angiogram to determine if PTA/stenting R SFA feasible-NOT operative candidate per Dr. Candie ChromanLawson's note  stage III left L gluteal wound10/07/2015 status post fall,? LOC aortic valve stenosis + rheumatic fever history as a child Memory deficit, dementia  right femoral DVT currently on Xarelto Goiter  Dysthymia, depression   recently allegedly left [facility]  Admitted 4.28.17 with sepsis, worsening R Heel wound  On admission white  count 13, hemoglobin 11, platelet 263, sedimentation rate 17 CMET wnl Foot Xray-no evidence Osteo  VVS and Ortho consulted  Hospital Course:  Active Problems:  Hypertension  Diabetic foot ulcer (HCC)  Diabetes mellitus (HCC)  Hypokalemia  Cellulitis  Foot ulcer (HCC)  Diabetic foot ulcer associated with type 2 diabetes mellitus (HCC)  Chronic right heel ulcer -Discussed in detail with Dr. Imogene Burnhen, went over angiogram results from 11 2016 -Amputation only operative option-no chances for re-vascularization -have d/w patient-she initially was agreeable to procedure after much discussion with her son present in room 4/30-"I want a 2nd opinion"--Dr. Madelin Rearlin Ortho had been made aware prior to her request. She had a second opinion from orthopedics Dr. Victorino DikeHewitt who concurred that an amputation is needed and patient absolutely refuses -1/2 Bc growing "Gram variable rods" -APPREICATE ID input-we will await final cultures and then discharge patient on an oral antibiotic if she is willing to take it (she already states that she refuses to take any medication other than what Dr. Lupe Carneyean Mitchell prescribed) -I have had extensive discussions with her son and he agrees that he can not really do anything differently and he will try to manage her at home as she refuses to go to a skilled facility although it has been recommended to her. - Use tylenol #3   Diabetes mellitus type 2 -Patient has stated in the past "I'm not diabetic" -blood sugars ranging from 131-135 -Note history of documented noncompliance with medications in the outpatient setting-skilled facility  Hypertension -Continue losartan 25 daily -continue atenolol 50 daily  Stage III gluteal wound - continue Santyl twice a day to wound  History DVT right side -Continue Xarelto 20 daily  Moderate protein energy malnutrition -Give boost supplement 1 container daily lunch  Mod-severe AoS -critical stenosis based on echo 01/19/16 Very  poor candidate for any type of operative management of her valve -She has chronic underlying infection which needs to be addressed first Diastolic heart failure-compensated and nonacute PASP 31 mm hg   Reflux -Continue Pepcid 20 daily when necessary -given nausea have increased this PEpcid-p>40 -add protonix 40 and re-eval in am   Discharge Exam: Filed Vitals:   01/19/16 2358 01/20/16 0615  BP: 126/43 137/42  Pulse: 105 62  Temp: 98.4 F (36.9 C) 98.1 F (36.7 C)  Resp: 16 16    General: ALERT ORIETNED AND ABLE TO VERBALIZE Cardiovascular: S1 S2  Respiratory: CHEST CLEAR  Discharge Instructions    Current Discharge Medication List    CONTINUE these medications which have NOT CHANGED   Details  acetaminophen (TYLENOL) 325 MG tablet Take 325 mg by mouth every 4 (four) hours as needed for mild pain.     alum & mag hydroxide-simeth (MAALOX PLUS) 400-400-40 MG/5ML suspension Take 30 mLs by mouth every 6 (six) hours as needed for indigestion.     diphenhydrAMINE (BENADRYL) 25 mg capsule Take 25 mg by mouth every 6 (six) hours as needed for itching.     famotidine (PEPCID) 20 MG tablet Take 20 mg by mouth daily as needed for heartburn or indigestion.    feeding supplement (BOOST / RESOURCE BREEZE) LIQD Take 240 mLs by mouth daily after breakfast.     HYDROcodone-acetaminophen (NORCO/VICODIN) 5-325 MG tablet Take one tablet by mouth every 6 hours as needed for pain. Do not exceed 4gm of Tylenol in 24 hours Qty: 120 tablet, Refills: 0    indapamide (LOZOL) 1.25 MG tablet Take 1.25 mg by mouth every morning.    losartan (COZAAR) 25 MG tablet Take 25 mg by mouth daily.    mirtazapine (REMERON) 15 MG tablet Take 15 mg by mouth at bedtime.    potassium chloride SA (K-DUR,KLOR-CON) 20 MEQ tablet Take 20 mEq by mouth daily.    promethazine (PHENERGAN) 25 MG tablet Take 25 mg by mouth every 4 (four) hours as needed for nausea or vomiting.    rivaroxaban (XARELTO) 20 MG TABS  tablet Take 20 mg by mouth daily with breakfast.     saccharomyces boulardii (FLORASTOR) 250 MG capsule Take 1 capsule (250 mg total) by mouth 2 (two) times daily. Qty: 30 capsule, Refills: 0    SANTYL ointment Apply 1 application topically 2 (two) times daily. For wound care.    TENORMIN 50 MG tablet Take 50 mg by mouth daily. Refills: 0         The results of significant diagnostics from this hospitalization (including imaging, microbiology, ancillary and laboratory) are listed below for reference.    Significant Diagnostic Studies: Dg Foot Complete Right  02/05/2016  CLINICAL DATA:  Heel ulcer since October. EXAM: RIGHT FOOT COMPLETE - 3+ VIEW COMPARISON:  11/16/2015 FINDINGS: Unchanged appearance of skin irregularity and gas over the heel consistent with ulcer. There is no evidence of osteomyelitis. No opaque foreign body. Hallux valgus and bony bunion.  Osteopenia. IMPRESSION: Heel ulcer without opaque foreign body or evidence of osteomyelitis. Electronically Signed   By: Marnee Spring M.D.   On: Feb 05, 2016 21:26    Microbiology: Recent Results (from the past 240 hour(s))  Culture, blood (Routine x 2)     Status: None   Collection Time: Feb 05, 2016  5:31 PM  Result Value Ref Range Status   Specimen Description LEFT ANTECUBITAL  Final   Special Requests BOTTLES DRAWN AEROBIC AND ANAEROBIC 5CC  Final   Culture   Final    NO GROWTH 5 DAYS Performed at [facility]    Report Status 01/20/2016 FINAL  Final  Culture, blood (Routine x 2)     Status: None (Preliminary result)   Collection Time: 01/15/16  6:16 PM  Result Value Ref Range Status   Specimen Description BLOOD RIGHT ANTECUBITAL  Final   Special Requests BOTTLES DRAWN AEROBIC AND ANAEROBIC 5 ML  Final   Culture  Setup Time   Final    GRAM VARIABLE ROD AEROBIC BOTTLE ONLY CRITICAL RESULT CALLED TO, READ BACK BY AND VERIFIED WITH: F. HERNDON,RN AT 1518 ON [id number] BY Lucienne Capers    Culture   Final    GRAM  VARIABLE ROD IDENTIFICATION TO FOLLOW Performed at [facility]    Report Status PENDING  Incomplete  Urine culture     Status: Abnormal   Collection Time: 01/15/16  8:23 PM  Result Value Ref Range Status   Specimen Description URINE, CLEAN CATCH  Final   Special Requests NONE  Final   Culture MULTIPLE SPECIES PRESENT, SUGGEST RECOLLECTION (A)  Final   Report Status 01/17/2016 FINAL  Final  MRSA PCR Screening     Status: Abnormal   Collection Time: 01/16/16 12:41 AM  Result Value Ref Range Status   MRSA by PCR POSITIVE (A) NEGATIVE Final    Comment:        The GeneXpert MRSA Assay (FDA approved for NASAL specimens only), is one component of a comprehensive MRSA colonization surveillance program. It is not intended to diagnose MRSA infection nor to guide or monitor treatment for MRSA infections. RESULT CALLED TO, READ BACK BY AND VERIFIED WITH: M MOYE AT 0609 ON 04.29.2017 BY NBROOKS      Labs: Basic Metabolic Panel:  Recent Labs Lab 01/15/16 1730 01/16/16 0324 01/18/16 0334 01/20/16 0329  NA 142 143 139 141  K 3.3* 3.5 4.0 4.0  CL 105 108 104 107  CO2 27 27 26 27   GLUCOSE 135* 74 119* 127*  BUN 12 10 22* 17  CREATININE 0.81 0.85 0.90 1.03*  CALCIUM 9.6 9.2 9.6 9.1  MG  --  2.0  --   --   PHOS  --  3.0  --   --    Liver Function Tests:  Recent Labs Lab 01/15/16 1730 01/16/16 0324 01/18/16 0334  AST 22 17 16   ALT 14 12* 12*  ALKPHOS 67 60 60  BILITOT 0.4 0.6 0.7  PROT 7.0 6.7 6.6  ALBUMIN 3.8 3.6 3.5   No results for input(s): LIPASE, AMYLASE in the last 168 hours. No results for input(s): AMMONIA in the last 168 hours. CBC:  Recent Labs Lab 01/15/16 1730 01/16/16 0324 01/18/16 0334 01/19/16 0130 01/20/16 0329  WBC 12.2* 13.2* 14.1* 11.2* 11.6*  NEUTROABS 9.5*  --   --   --   --   HGB 11.3* 11.3* 11.5* 10.3* 9.8*  HCT 33.7* 34.8* 35.6* 30.9* 30.1*  MCV 85.5 88.3 87.9 87.5 85.5  PLT 237 263 309 244 255   Cardiac Enzymes: No  results for input(s): CKTOTAL, CKMB, CKMBINDEX, TROPONINI in the last 168 hours. BNP: BNP (last 3 results) No results for input(s): BNP in the last 8760 hours.  ProBNP (last 3 results) No results for input(s): PROBNP in the last 8760  hours.  CBG:  Recent Labs Lab 01/19/16 0822 01/19/16 1402 01/19/16 1728 01/20/16 0004 01/20/16 0824  GLUCAP 131* 135* 110* 93 133*       Signed:  Rhetta Mura MD   [facility] 01/20/2016, 3:49 PM

## 2016-01-20 NOTE — Progress Notes (Signed)
Subjective: Pt denies any pain in her foot.  She says she is being kept here against her wishes and just wants to go home.  She denies any f/c/n/v.   Objective: Vital signs in last 24 hours: Temp:  [98.1 F (36.7 C)-98.5 F (36.9 C)] 98.1 F (36.7 C) (05/03 0615) Pulse Rate:  [61-105] 62 (05/03 0615) Resp:  [16] 16 (05/03 0615) BP: (126-137)/(41-43) 137/42 mmHg (05/03 0615) SpO2:  [96 %-99 %] 96 % (05/03 0615)  Intake/Output from previous day: 05/02 0701 - 05/03 0700 In: 480 [P.O.:480] Out: -  Intake/Output this shift:     Recent Labs  01/18/16 0334 01/19/16 0130 01/20/16 0329  HGB 11.5* 10.3* 9.8*    Recent Labs  01/19/16 0130 01/20/16 0329  WBC 11.2* 11.6*  RBC 3.53* 3.52*  HCT 30.9* 30.1*  PLT 244 255    Recent Labs  01/18/16 0334 01/20/16 0329  NA 139 141  K 4.0 4.0  CL 104 107  CO2 26 27  BUN 22* 17  CREATININE 0.90 1.03*  GLUCOSE 119* 127*  CALCIUM 9.6 9.1    Recent Labs  01/18/16 0334  INR 1.04    PE:  elderly cachectic female in nad.  Alert and Oriented.  EOMI.  resp unlabored.  R heel ulcer dressed and dry.  Assessment/Plan: R chronic diabetic heel ulcer.  I explained the nature of the ulcer and the risks of the treatment options again in detail.  She states that she understands that treating the wound with local wound care and abx could lead to infection that could kill her.  She states that she's not going to have any surgery under any circumstance.  I spoke with the patient's son by phone today and went over the diagnosis and treatment options in detail.  He understands the plan and agrees.  I'll cancel her surgery for tomorrow and sign off.  Please contact me with any questions.    D/w Dr. Mahala MenghiniSamtani.   Jasmine ArthursHEWITT, Jasmine Fernandez 01/20/2016, 1:02 PM

## 2016-01-20 NOTE — Progress Notes (Signed)
CSW consulted to assist with medications. CSW is unable to assist with this request. In some situations RNCM is able to provide assistance. Please consult RNCM for possible assistance.  Cori RazorJamie Shankar Silber LCSW [phone]

## 2016-01-21 ENCOUNTER — Encounter (HOSPITAL_COMMUNITY): Payer: Medicare PPO

## 2016-01-21 ENCOUNTER — Encounter (HOSPITAL_COMMUNITY)
Admission: EM | Disposition: A | Discharge status: Home Health | Payer: Self-pay | Source: Home / Self Care | Attending: Family Medicine

## 2016-01-21 ENCOUNTER — Encounter (HOSPITAL_COMMUNITY): Payer: Self-pay | Admitting: Student

## 2016-01-21 ENCOUNTER — Inpatient Hospital Stay (HOSPITAL_COMMUNITY): Payer: Medicare Other

## 2016-01-21 DIAGNOSIS — M869 Osteomyelitis, unspecified: Secondary | ICD-10-CM

## 2016-01-21 DIAGNOSIS — E1169 Type 2 diabetes mellitus with other specified complication: Secondary | ICD-10-CM

## 2016-01-21 LAB — HEMOGLOBIN A1C
HEMOGLOBIN A1C: 6 % — AB (ref 4.8–5.6)
MEAN PLASMA GLUCOSE: 126 mg/dL

## 2016-01-21 LAB — GLUCOSE, CAPILLARY
GLUCOSE-CAPILLARY: 117 mg/dL — AB (ref 65–99)
GLUCOSE-CAPILLARY: 82 mg/dL (ref 65–99)
GLUCOSE-CAPILLARY: 109 mg/dL — AB (ref 65–99)

## 2016-01-21 LAB — CBC
HCT: 32 % — ABNORMAL LOW (ref 36.0–46.0)
Hemoglobin: 10.4 g/dL — ABNORMAL LOW (ref 12.0–15.0)
MCH: 28.7 pg (ref 26.0–34.0)
MCHC: 32.5 g/dL (ref 30.0–36.0)
MCV: 88.4 fL (ref 78.0–100.0)
PLATELETS: 240 10*3/uL (ref 150–400)
RBC: 3.62 MIL/uL — AB (ref 3.87–5.11)
RDW: 14.1 % (ref 11.5–15.5)
WBC: 11.2 10*3/uL — AB (ref 4.0–10.5)

## 2016-01-21 LAB — HEPATITIS C ANTIBODY (REFLEX)

## 2016-01-21 LAB — HCV COMMENT:

## 2016-01-21 SURGERY — AMPUTATION BELOW KNEE
Anesthesia: General | Laterality: Right

## 2016-01-21 MED ORDER — BOOST / RESOURCE BREEZE PO LIQD: 1.0000 | Freq: Three times a day (TID) | ORAL | Status: DC

## 2016-01-21 NOTE — Progress Notes (Signed)
Nutrition Follow-up  INTERVENTION:   -Increase Boost Breeze po TID, each supplement provides 250 kcal and 9 grams of protein -RD to continue to monitor  NUTRITION DIAGNOSIS:   Increased nutrient needs related to wound healing as evidenced by estimated needs.  Ongoing.  GOAL:   Patient will meet greater than or equal to 90% of their needs  Progressing.  MONITOR:   PO intake, Supplement acceptance, Labs, Weight trends, Skin, I & O's  REASON FOR ASSESSMENT:   Consult Wound healing  ASSESSMENT:   82 y.o. female with medical history significant of peripheral vascular disease with complete occlusion of the right superficial femoral artery diabetes mellitus hypertension medical noncompliance right heel wound chronic  RD spoke with RN, who states that the patient is more accepting of care and has stated that she is willing to have surgery at this time. RN currently assessing wound in room and RD was unable to speak with patient. RN states that the patient has not had her Boost Breeze supplement today. If patient has surgery for her wound, she would benefit from the additional kcal and protein provided by supplement. RD to increase order to TID with meals.   Medications reviewed. Labs reviewed: CBGs: 117-133  Diet Order:  Diet Carb Modified Fluid consistency:: Thin; Room service appropriate?: Yes  Skin:  Wound (see comment) (DM foot ulcer)  Last BM:  5/1  Height:   Ht Readings from Last 1 Encounters:  01/15/16 5' (1.524 m)    Weight:   Wt Readings from Last 1 Encounters:  01/15/16 123 lb 7.3 oz (56 kg)    Ideal Body Weight:  45.5 kg  BMI:  Body mass index is 24.11 kg/(m^2).  Estimated Nutritional Needs:   Kcal:  1700-1900  Protein:  75-85g  Fluid:  1.9L/day  EDUCATION NEEDS:   No education needs identified at this time  Tilda FrancoLindsey Odus Clasby, MS, RD, LDN Pager: [phone] After Hours Pager: [phone]

## 2016-01-21 NOTE — Progress Notes (Signed)
Patient discharged to home with grandson Reggie, discharge instructions reviewed with patient who verbalized understanding. RX was sent to Pharmacy for pt to pick up.

## 2016-01-21 NOTE — Progress Notes (Signed)
No changes Patient expressing her decision to not have surgery-"I want another opinion" Patient has had 3 separate surgical opinions to date Her MRI shows early Osteo I have strongly advised patient once again regarding surgery She is free to exercise her independent decisions I nwill d/c her on doxy She once again is VERY high risk for death and complications  Pleas KochJai Mina Babula, MD [facility] [phone]

## 2016-01-21 NOTE — Care Management Note (Signed)
Case Management Note  Patient Details  Name: Jasmine Fernandez MRN: [id number] Date of Birth: [birth year]  Subjective/Objective:      82 yo admitted with Hypertension, Diabetic wound infection              Action/Plan: From home alone.  Pt to discharge home.  She was at a SNF prior to going back home and refuses to go back to SNF.  HH orders placed [facility]  [facility].  Pt has 4 wheeled rollator and states she does not need additional equipment for home.  This CM called two daughters and left messages per pt request about pt discharging.  This CM also spoke with pt grandson Reggie ([phone]) to inform him pt will need a ride home.  Reggie states he will pick pt up and take her home.  No additional CM needs communicated.  Expected Discharge Date:                  Expected Discharge Plan:  Home w Home Health Services  In-House Referral:  Clinical Social Work  Discharge planning Services  CM Consult  Post Acute Care Choice:  Home Health Choice offered to:  Patient  DME Arranged:    DME Agency:     HH Arranged:  RN, PT, OT, Nurse's Aide, Social Work [employer] [facility]  [facility]  Status of Service:  Completed, signed off  Medicare Important Message Given:  Yes Date Medicare IM Given:    Medicare IM give by:    Date Additional Medicare IM Given:    Additional Medicare Important Message give by:     If discussed at Long Length of Stay Meetings, dates discussed:    Additional Comments:  Bartholome BillCLEMENTS, Benelli Winther H, RN 01/21/2016, 3:30 PM

## 2016-01-24 ENCOUNTER — Encounter (HOSPITAL_COMMUNITY): Payer: Self-pay | Admitting: *Deleted

## 2016-01-24 ENCOUNTER — Emergency Department (HOSPITAL_COMMUNITY)
Admission: EM | Admit: 2016-01-24 | Discharge: 2016-01-24 | Disposition: A | Discharge status: Home / Self Care | Payer: Medicare Other | Attending: Emergency Medicine | Admitting: Emergency Medicine

## 2016-01-24 DIAGNOSIS — Z7901 Long term (current) use of anticoagulants: Secondary | ICD-10-CM | POA: Insufficient documentation

## 2016-01-24 DIAGNOSIS — E11621 Type 2 diabetes mellitus with foot ulcer: Secondary | ICD-10-CM | POA: Diagnosis not present

## 2016-01-24 DIAGNOSIS — Z79891 Long term (current) use of opiate analgesic: Secondary | ICD-10-CM | POA: Insufficient documentation

## 2016-01-24 DIAGNOSIS — Z7982 Long term (current) use of aspirin: Secondary | ICD-10-CM | POA: Diagnosis not present

## 2016-01-24 DIAGNOSIS — L97411 Non-pressure chronic ulcer of right heel and midfoot limited to breakdown of skin: Secondary | ICD-10-CM | POA: Insufficient documentation

## 2016-01-24 DIAGNOSIS — M869 Osteomyelitis, unspecified: Secondary | ICD-10-CM | POA: Diagnosis not present

## 2016-01-24 DIAGNOSIS — I10 Essential (primary) hypertension: Secondary | ICD-10-CM | POA: Insufficient documentation

## 2016-01-24 DIAGNOSIS — L97519 Non-pressure chronic ulcer of other part of right foot with unspecified severity: Secondary | ICD-10-CM

## 2016-01-24 DIAGNOSIS — Z79899 Other long term (current) drug therapy: Secondary | ICD-10-CM | POA: Diagnosis not present

## 2016-01-24 LAB — CBC WITH DIFFERENTIAL/PLATELET
BASOS PCT: 0 %
Basophils Absolute: 0 10*3/uL (ref 0.0–0.1)
EOS ABS: 0.1 10*3/uL (ref 0.0–0.7)
Eosinophils Relative: 1 %
HEMATOCRIT: 34.3 % — AB (ref 36.0–46.0)
HEMOGLOBIN: 11.3 g/dL — AB (ref 12.0–15.0)
Lymphocytes Relative: 12 %
Lymphs Abs: 1.3 10*3/uL (ref 0.7–4.0)
MCH: 28.3 pg (ref 26.0–34.0)
MCHC: 32.9 g/dL (ref 30.0–36.0)
MCV: 85.8 fL (ref 78.0–100.0)
MONOS PCT: 5 %
Monocytes Absolute: 0.6 10*3/uL (ref 0.1–1.0)
Neutro Abs: 8.6 10*3/uL — ABNORMAL HIGH (ref 1.7–7.7)
Neutrophils Relative %: 82 %
Platelets: 300 10*3/uL (ref 150–400)
RBC: 4 MIL/uL (ref 3.87–5.11)
RDW: 13.9 % (ref 11.5–15.5)
WBC: 10.5 10*3/uL (ref 4.0–10.5)

## 2016-01-24 LAB — COMPREHENSIVE METABOLIC PANEL
ALBUMIN: 3.6 g/dL (ref 3.5–5.0)
ALK PHOS: 63 U/L (ref 38–126)
ALT: 16 U/L (ref 14–54)
ANION GAP: 8 (ref 5–15)
AST: 24 U/L (ref 15–41)
BILIRUBIN TOTAL: 0.5 mg/dL (ref 0.3–1.2)
BUN: 15 mg/dL (ref 6–20)
CO2: 29 mmol/L (ref 22–32)
Calcium: 10 mg/dL (ref 8.9–10.3)
Chloride: 107 mmol/L (ref 101–111)
Creatinine, Ser: 0.75 mg/dL (ref 0.44–1.00)
GFR calc Af Amer: >60 mL/min (ref >60)
GFR calc non Af Amer: >60 mL/min (ref >60)
GLUCOSE: 105 mg/dL — AB (ref 65–99)
POTASSIUM: 4.4 mmol/L (ref 3.5–5.1)
SODIUM: 144 mmol/L (ref 135–145)
TOTAL PROTEIN: 7 g/dL (ref 6.5–8.1)

## 2016-01-24 LAB — URINE MICROSCOPIC-ADD ON

## 2016-01-24 LAB — URINALYSIS, ROUTINE W REFLEX MICROSCOPIC
Bilirubin Urine: NEGATIVE
Glucose, UA: NEGATIVE mg/dL
Hgb urine dipstick: NEGATIVE
KETONES UR: NEGATIVE mg/dL
LEUKOCYTES UA: NEGATIVE
NITRITE: NEGATIVE
PH: 5.5 (ref 5.0–8.0)
Protein, ur: 30 mg/dL — AB
SPECIFIC GRAVITY, URINE: 1.028 (ref 1.005–1.030)

## 2016-01-24 LAB — CBG MONITORING, ED: Glucose-Capillary: 98 mg/dL (ref 65–99)

## 2016-01-24 MED ORDER — POTASSIUM CHLORIDE ER 10 MEQ PO TBCR: 20.0000 meq | EXTENDED_RELEASE_TABLET | Freq: Every day | ORAL | Status: AC

## 2016-01-24 MED ORDER — LOSARTAN POTASSIUM 25 MG PO TABS: 25.0000 mg | ORAL_TABLET | Freq: Every day | ORAL | Status: DC

## 2016-01-24 MED ORDER — INDAPAMIDE 2.5 MG PO TABS: 1.2500 mg | ORAL_TABLET | Freq: Every day | ORAL | Status: DC

## 2016-01-24 MED ORDER — MIRTAZAPINE 15 MG PO TABS: 15.0000 mg | ORAL_TABLET | Freq: Every day | ORAL | Status: DC

## 2016-01-24 MED ORDER — RIVAROXABAN 20 MG PO TABS: 20.0000 mg | ORAL_TABLET | Freq: Every day | ORAL | Status: DC

## 2016-01-24 MED ORDER — FAMOTIDINE 20 MG PO TABS: 20.0000 mg | ORAL_TABLET | Freq: Every day | ORAL | Status: DC

## 2016-01-24 MED ORDER — PROMETHAZINE HCL 25 MG PO TABS: 25.0000 mg | ORAL_TABLET | Freq: Four times a day (QID) | ORAL | Status: DC | PRN

## 2016-01-24 MED ORDER — ATENOLOL 50 MG PO TABS: 50.0000 mg | ORAL_TABLET | Freq: Every day | ORAL | Status: AC

## 2016-01-24 NOTE — Care Management Note (Signed)
Case Management Note  Patient Details  Name: Jasmine Fernandez MRN: [id number] Date of Birth: [birth year]  Subjective/Objective:   chronic diabetic ulcer over the right heel that is complicated by osteomyelitis                 Action/Plan: Discharge Planning: AVS reviewed:  NCM spoke pt and nieces, Ileene Patrickadine Edwards # [phone] at bedside. Son arrived also, Lennar Corporationobert Benevides. [facility] Contacted [facility] and due to lack of caregiver available to assist with dressing changes, agency will not be able to service patient. Explained to pt the situation and she states [facility] and [facility]. She had toured the facility and was given the prices in the past. She understands she needs assistance at home but her family would not be able to provide 24 hour assistance or commit to doing dressing changes on the days [facility] not [facility]. ED RN provided pt with her Rx for home. Eliezer ChampagneContacted Sara NectarBlack [phone] or [phone] [facility]. Left message. Pt has RW and wheelchair at home. Will follow up on 01/25/2016 with ALF. Provided pt with list of private duty agencies.   PCP-Dr Lupe Carneyean Mitchell   Expected Discharge Date:  01/24/2016              Expected Discharge Plan:  Assisted Living / Rest Home  In-House Referral:  Clinical Social Work  Discharge planning Services  CM Consult  Post Acute Care Choice:  NA Choice offered to:  NA  DME Arranged:  N/A DME Agency:  NA  HH Arranged:  NA HH Agency:  NA  Status of Service:  Completed, signed off  Medicare Important Message Given:    Date Medicare IM Given:    Medicare IM give by:    Date Additional Medicare IM Given:    Additional Medicare Important Message give by:     If discussed at Long Length of Stay Meetings, dates discussed:    Additional Comments:  Elliot CousinShavis, Reuben Knoblock Ellen, RN 01/24/2016, 5:14 PM

## 2016-01-24 NOTE — NC FL2 (Signed)
[place] MEDICAID FL2 LEVEL OF CARE SCREENING TOOL     IDENTIFICATION  Patient Name: Jasmine Fernandez Birthdate: 01/22/1929 Sex: female Admission Date (Current Location): 01/24/2016  [facility] [place]   Facility and Address:  [facility],  [address], [place] [id number]      Provider Number: [id number]  Attending Physician Name and Address:  No att. providers found  Relative Name and Phone Number:       Current Level of Care: Hospital Recommended Level of Care: Assisted Living Facility Prior Approval Number:    Date Approved/Denied:   PASRR Number:    Discharge Plan: Other (Comment) (assisted living )    Current Diagnoses: Patient Active Problem List   Diagnosis Date Noted  . Severe aortic stenosis   . Non-healing ulcer (HCC)   . Diabetic foot ulcer (HCC) 01/15/2016  . Diabetes mellitus (HCC) 01/15/2016  . Hypokalemia 01/15/2016  . Cellulitis 01/15/2016  . Foot ulcer (HCC) 01/15/2016  . Diabetic foot ulcer associated with type 2 diabetes mellitus (HCC) 01/15/2016  . AKI (acute kidney injury) (HCC) 07/01/2015  . Leukocytosis 07/01/2015  . Malnutrition of moderate degree 06/30/2015  . Weakness 06/29/2015  . Pressure ulcer 06/29/2015  . Fall 06/29/2015  . Hypertension   . Aortic stenosis   . Diabetes mellitus without complication (HCC)     Orientation RESPIRATION BLADDER Height & Weight     Self, Time, Situation, Place  Normal Continent Weight: 120 lb (54.432 kg) Height:  5' (152.4 cm)  BEHAVIORAL SYMPTOMS/MOOD NEUROLOGICAL BOWEL NUTRITION STATUS      Continent    AMBULATORY STATUS COMMUNICATION OF NEEDS Skin   Limited Assist Verbally Other (Comment), PU Stage and Appropriate Care (deep tissue injury right heel dressing changed daily)                       Personal Care Assistance Level of Assistance  Bathing, Dressing Bathing Assistance: Limited assistance   Dressing Assistance: Limited assistance      Functional Limitations Info             SPECIAL CARE FACTORS FREQUENCY                       Contractures      Additional Factors Info                  Current Medications (01/24/2016):  This is the current hospital active medication list No current facility-administered medications for this encounter.   Current Outpatient Prescriptions  Medication Sig Dispense Refill  . acetaminophen (TYLENOL) 325 MG tablet Take 325 mg by mouth every 4 (four) hours as needed for mild pain.     Marland Kitchen. alum & mag hydroxide-simeth (MAALOX PLUS) 400-400-40 MG/5ML suspension Take 30 mLs by mouth every 6 (six) hours as needed for indigestion.     Marland Kitchen. aspirin EC 81 MG tablet Take 162 mg by mouth daily.    . diphenhydrAMINE (BENADRYL) 25 mg capsule Take 25 mg by mouth every 6 (six) hours as needed for itching.     . famotidine (PEPCID) 20 MG tablet Take 20 mg by mouth daily as needed for heartburn or indigestion.    . feeding supplement (BOOST / RESOURCE BREEZE) LIQD Take 240 mLs by mouth daily after breakfast.     . HYDROcodone-acetaminophen (NORCO/VICODIN) 5-325 MG tablet Take one tablet by mouth every 6 hours as needed for pain. Do not exceed 4gm of  Tylenol in 24 hours (Patient taking differently: Take 1 tablet by mouth every 6 (six) hours as needed. pain. Do not exceed 4gm of Tylenol in 24 hours) 120 tablet 0  . indapamide (LOZOL) 1.25 MG tablet Take 1.25 mg by mouth every morning.    . iodine-sodium iodide 2-2.4 % solution Apply 1 application topically daily as needed (apply to wound).    . losartan (COZAAR) 25 MG tablet Take 25 mg by mouth daily.    . mirtazapine (REMERON) 15 MG tablet Take 15 mg by mouth at bedtime.    . Multiple Vitamin (MULTIVITAMIN WITH MINERALS) TABS tablet Take 1 tablet by mouth daily.    . potassium chloride SA (K-DUR,KLOR-CON) 20 MEQ tablet Take 20 mEq by mouth daily.    . promethazine (PHENERGAN) 25 MG tablet Take 25 mg by mouth every 4 (four) hours as needed for  nausea or vomiting.    . rivaroxaban (XARELTO) 20 MG TABS tablet Take 20 mg by mouth daily with breakfast.     . saccharomyces boulardii (FLORASTOR) 250 MG capsule Take 1 capsule (250 mg total) by mouth 2 (two) times daily. 30 capsule 0  . SANTYL ointment Apply 1 application topically 2 (two) times daily. For wound care.    . TENORMIN 50 MG tablet Take 50 mg by mouth daily.  0  . atenolol (TENORMIN) 50 MG tablet Take 1 tablet (50 mg total) by mouth daily. 30 tablet 0  . doxycycline (VIBRA-TABS) 100 MG tablet Take 1 tablet (100 mg total) by mouth 2 (two) times daily. (Patient not taking: Reported on 01/24/2016) 28 tablet 0  . famotidine (PEPCID) 20 MG tablet Take 1 tablet (20 mg total) by mouth daily. 30 tablet 1  . indapamide (LOZOL) 2.5 MG tablet Take 0.5 tablets (1.25 mg total) by mouth daily. 30 tablet 0  . losartan (COZAAR) 25 MG tablet Take 1 tablet (25 mg total) by mouth daily. 30 tablet 0  . mirtazapine (REMERON) 15 MG tablet Take 1 tablet (15 mg total) by mouth at bedtime. 30 tablet 0  . potassium chloride (K-DUR) 10 MEQ tablet Take 2 tablets (20 mEq total) by mouth daily. 60 tablet 0  . promethazine (PHENERGAN) 25 MG tablet Take 1 tablet (25 mg total) by mouth every 6 (six) hours as needed for nausea or vomiting. 30 tablet 0  . rivaroxaban (XARELTO) 20 MG TABS tablet Take 1 tablet (20 mg total) by mouth daily with supper. 30 tablet 0     Discharge Medications: Please see discharge summary for a list of discharge medications.  Relevant Imaging Results:  Relevant Lab Results:   Additional Information    Annetta Maw, LCSW

## 2016-01-24 NOTE — NC FL2 (Signed)
[place] MEDICAID FL2 LEVEL OF CARE SCREENING TOOL     IDENTIFICATION  Patient Name: Jasmine Fernandez Birthdate: 01/22/1929 Sex: female Admission Date (Current Location): 01/24/2016  [facility] [place]   Facility and Address:  [facility],  [address], [place] [phone]      Provider Number: [id number]  Attending Physician Name and Address:  No att. providers found  Relative Name and Phone Number:       Current Level of Care: Hospital Recommended Level of Care: Assisted Living Facility Prior Approval Number:    Date Approved/Denied:   PASRR Number:    Discharge Plan: Other (Comment) (assisted living )    Current Diagnoses: Patient Active Problem List   Diagnosis Date Noted  . Severe aortic stenosis   . Non-healing ulcer (HCC)   . Diabetic foot ulcer (HCC) 01/15/2016  . Diabetes mellitus (HCC) 01/15/2016  . Hypokalemia 01/15/2016  . Cellulitis 01/15/2016  . Foot ulcer (HCC) 01/15/2016  . Diabetic foot ulcer associated with type 2 diabetes mellitus (HCC) 01/15/2016  . AKI (acute kidney injury) (HCC) 07/01/2015  . Leukocytosis 07/01/2015  . Malnutrition of moderate degree 06/30/2015  . Weakness 06/29/2015  . Pressure ulcer 06/29/2015  . Fall 06/29/2015  . Hypertension   . Aortic stenosis   . Diabetes mellitus without complication (HCC)     Orientation RESPIRATION BLADDER Height & Weight     Self, Time, Situation, Place  Normal Continent Weight: 120 lb (54.432 kg) Height:  5' (152.4 cm)  BEHAVIORAL SYMPTOMS/MOOD NEUROLOGICAL BOWEL NUTRITION STATUS      Continent    AMBULATORY STATUS COMMUNICATION OF NEEDS Skin   Limited Assist Verbally Other (Comment), PU Stage and Appropriate Care (deep tissue injury right heel dressing changed daily)                       Personal Care Assistance Level of Assistance  Bathing, Dressing Bathing Assistance: Limited assistance   Dressing Assistance: Limited assistance      Functional Limitations Info             SPECIAL CARE FACTORS FREQUENCY                       Contractures      Additional Factors Info                  Current Medications (01/24/2016):  This is the current hospital active medication list No current facility-administered medications for this encounter.   Current Outpatient Prescriptions  Medication Sig Dispense Refill  . acetaminophen (TYLENOL) 325 MG tablet Take 325 mg by mouth every 4 (four) hours as needed for mild pain.     Marland Kitchen. alum & mag hydroxide-simeth (MAALOX PLUS) 400-400-40 MG/5ML suspension Take 30 mLs by mouth every 6 (six) hours as needed for indigestion.     Marland Kitchen. aspirin EC 81 MG tablet Take 162 mg by mouth daily.    . diphenhydrAMINE (BENADRYL) 25 mg capsule Take 25 mg by mouth every 6 (six) hours as needed for itching.     . famotidine (PEPCID) 20 MG tablet Take 20 mg by mouth daily as needed for heartburn or indigestion.    . feeding supplement (BOOST / RESOURCE BREEZE) LIQD Take 240 mLs by mouth daily after breakfast.     . HYDROcodone-acetaminophen (NORCO/VICODIN) 5-325 MG tablet Take one tablet by mouth every 6 hours as needed for pain. Do not exceed 4gm of  Tylenol in 24 hours (Patient taking differently: Take 1 tablet by mouth every 6 (six) hours as needed. pain. Do not exceed 4gm of Tylenol in 24 hours) 120 tablet 0  . indapamide (LOZOL) 1.25 MG tablet Take 1.25 mg by mouth every morning.    . iodine-sodium iodide 2-2.4 % solution Apply 1 application topically daily as needed (apply to wound).    . losartan (COZAAR) 25 MG tablet Take 25 mg by mouth daily.    . mirtazapine (REMERON) 15 MG tablet Take 15 mg by mouth at bedtime.    . Multiple Vitamin (MULTIVITAMIN WITH MINERALS) TABS tablet Take 1 tablet by mouth daily.    . potassium chloride SA (K-DUR,KLOR-CON) 20 MEQ tablet Take 20 mEq by mouth daily.    . promethazine (PHENERGAN) 25 MG tablet Take 25 mg by mouth every 4 (four) hours as needed for  nausea or vomiting.    . rivaroxaban (XARELTO) 20 MG TABS tablet Take 20 mg by mouth daily with breakfast.     . saccharomyces boulardii (FLORASTOR) 250 MG capsule Take 1 capsule (250 mg total) by mouth 2 (two) times daily. 30 capsule 0  . SANTYL ointment Apply 1 application topically 2 (two) times daily. For wound care.    . TENORMIN 50 MG tablet Take 50 mg by mouth daily.  0  . atenolol (TENORMIN) 50 MG tablet Take 1 tablet (50 mg total) by mouth daily. 30 tablet 0  . doxycycline (VIBRA-TABS) 100 MG tablet Take 1 tablet (100 mg total) by mouth 2 (two) times daily. (Patient not taking: Reported on 01/24/2016) 28 tablet 0  . famotidine (PEPCID) 20 MG tablet Take 1 tablet (20 mg total) by mouth daily. 30 tablet 1  . indapamide (LOZOL) 2.5 MG tablet Take 0.5 tablets (1.25 mg total) by mouth daily. 30 tablet 0  . losartan (COZAAR) 25 MG tablet Take 1 tablet (25 mg total) by mouth daily. 30 tablet 0  . mirtazapine (REMERON) 15 MG tablet Take 1 tablet (15 mg total) by mouth at bedtime. 30 tablet 0  . potassium chloride (K-DUR) 10 MEQ tablet Take 2 tablets (20 mEq total) by mouth daily. 60 tablet 0  . promethazine (PHENERGAN) 25 MG tablet Take 1 tablet (25 mg total) by mouth every 6 (six) hours as needed for nausea or vomiting. 30 tablet 0  . rivaroxaban (XARELTO) 20 MG TABS tablet Take 1 tablet (20 mg total) by mouth daily with supper. 30 tablet 0     Discharge Medications: Please see discharge summary for a list of discharge medications.  Relevant Imaging Results:  Relevant Lab Results:   Additional Information    Marcey Persad G, LCSW     

## 2016-01-24 NOTE — Discharge Instructions (Signed)
Please go to pharmacy now to pick up prescriptions.   Return without fail for worsening symptoms, including fever, worsening pain or swelling in your foot, confusion, or any other symptoms concerning to you.  You have been given resources for home health aid through private company. Our case manager will arrange additional home health for you that insurance will cover so that in total you could obtain daily care.   Please take all medications as prescribe, especially antibiotic.  You have been given orthopedic follow-up for second opinion if you wish, but we encourage you follow-up with orthopedic surgery for surgical management ASAP.  Please call your PCP for refills and re-evaluation.  Bone and Joint Infections, Adult Bone infections (osteomyelitis) and joint infections (septic arthritis) occur when bacteria or other germs get inside a bone or a joint. This can happen if you have an infection in another part of your body that spreads through your blood. Germs from your skin or from outside of your body can also cause this type of infection if you have a wound or a broken bone (fracture) that breaks the skin. Anyone can get a bone infection or joint infection. You may be more likely to get this type of infection if you have a condition, such as diabetes, that lowers your ability to fight infection or increases your chances of getting an infection. Bone and joint infections can cause damage, and they can spread to other areas of your body. They need to be treated quickly. CAUSES Most bone and joint infections are caused by bacteria. They can also be caused by other germs, such as viruses and funguses. RISK FACTORS This condition is more likely to develop in:  People who recently had surgery, especially bone or joint surgery.  People who have a long-term (chronic) disease, such as:  HIV (human immunodeficiency virus).  Diabetes.  Rheumatoid arthritis.  Sickle cell anemia.  Elderly  people.  People who take medicines that block or weaken the body's defense system (immune system).  People who have a condition that reduces their blood flow.  People who are on kidney dialysis.  People who have an artificial joint.  People who have had a joint or bone repaired with plates or screws (surgical hardware).  People who use or abuse IV drugs.  People who have had trauma, such as stepping on a nail. SYMPTOMS Symptoms vary depending on the type and location of your infection. Common symptoms of bone and joint infections include:  Fever and chills.  Redness and warmth.  Swelling.  Pain and stiffness.  Drainage of fluid or pus near the infection.  Weight loss and fatigue.  Decreased ability to use a hand or foot. DIAGNOSIS This condition may be diagnosed based on symptoms, medical history, a physical exam, and diagnostic tests. Tests can help to identify the cause of the infection. You may have various tests, such as:  A sample of tissue, fluid, or blood taken to be examined under a microscope.  A procedure to remove fluid from the infected joint with a needle (joint aspiration) for testing in a lab.  Pus or discharge swabbed from a wound for testing to identify germs and to determine what type of medicine will kill them (culture and sensitivity).  Blood tests to look for evidence of infection and inflammation (biomarkers).  Imaging studies to determine how severe the bone or joint infection is. These may include:  X-rays.  CT scan.  MRI.  Bone scan. TREATMENT Treatment depends on the  cause and type of infection. Antibiotic medicines are usually the first treatment for a bone or joint infection. Treatment with antibiotics may include:  Getting IV antibiotics. This may be done in a hospital at first. You may have to continue IV antibiotics at home for several weeks. You may also have to take antibiotics by mouth for several weeks after that.  Taking more  than one kind of antibiotic. Treatment may start with a type of antibiotic that works against many different bacteria (broad spectrumantibiotics). IV antibiotics may be changed if tests show that another type may work better. Other treatments may include:  Draining fluid from the joint by placing a needle into it (aspiration).  Surgery to remove:  Dead or dying tissue from a bone or joint.  An infected artificial joint.  Infected plates or screws that were used to repair a broken bone. HOME CARE INSTRUCTIONS  Take medicines only as directed by your health care provider.  Take your antibiotic medicine as directed by your health care provider. Finish the antibiotic even if you start to feel better.  Follow instructions from your health care provider about how to take IV antibiotics at home.  Ask your health care provider if you have any restrictions on your activities.  Keep all follow-up visits as directed by your health care provider. This is important. SEEK MEDICAL CARE IF:  You have a fever or chills.  You have redness, warmth, pain, or swelling that returns after treatment. SEEK IMMEDIATE MEDICAL CARE IF:  You have rapid breathing or you have trouble breathing.  You have chest pain.  You cannot drink fluids or make urine.  The affected arm or leg swells, changes color, or turns blue.   This information is not intended to replace advice given to you by your health care provider. Make sure you discuss any questions you have with your health care provider.   Document Released: 09/05/2005 Document Revised: 01/20/2015 Document Reviewed: 09/03/2014 Elsevier Interactive Patient Education [employer]

## 2016-01-24 NOTE — ED Notes (Signed)
Wound care to left heel completed with the application of Xeroform Occlusive gauze patch, wet to dry 4x4 and dry 4x12 stretch bandage roll.

## 2016-01-24 NOTE — ED Provider Notes (Signed)
CSN: [id number]     Arrival date & time 01/24/16  1128 History   First MD Initiated Contact with Patient 01/24/16 1210     Chief Complaint  Patient presents with  . Foot Ulcer  . Medication Management     (Consider location/radiation/quality/duration/timing/severity/associated sxs/prior Treatment) HPI 82 year old female who presents for wound check and medication management. He has a history of diabetes, chronic diabetic ulcer over the right heel that is complicated by osteomyelitis. She was recently admitted into the hospital for management of her diabetic ulcer with osteomyelitis and discharged to home on 01/20/2016. She was evaluated by vascular surgery and orthopedic surgery during her hospitalization. Patient was not an operative candidate for revascularization by vascular surgery, and orthopedic surgery had recommended amputation of her foot which patient did not want. Patient had wanted a second opinion prior to committing to any definitive surgery her care. She was living at a nursing facility prior to hospitalization, but patient had requested discharge home instead of back to the nursing facility and home health aid in wound care was set up for her for home. She had first visit with home health today, and they did not feel they could manager her wound. Thus, sent her to ed. she denies worsening foot pain or swelling, denies any drainage from her wound, fevers or chills, confusion, nausea or vomiting. States that she wants to go home, and that she needs help with wound care and home nursing. She also states that she did not know she had any medications, and has not picked up any medications from the pharmacies to take at home. Plan to have her continue doxycycline at home.   Past Medical History  Diagnosis Date  . Hypertension   . Goiter   . Aortic stenosis     a. Echo 01/2016: severe AS, Valve area (VTI): 0.51 cm^2.   . Rheumatic fever   . Diabetes mellitus without complication [facility])      patient states she is not diabetic.    Past Surgical History  Procedure Laterality Date  . Tonsillectomy      During Childhood  . Peripheral vascular catheterization N/A 08/19/2015    Procedure: Lower Extremity Angiography;  Surgeon: Nada Libman, MD;  Location: [facility] INVASIVE CV LAB;  Service: Cardiovascular;  Laterality: N/A;   Family History  Problem Relation Age of Onset  . Heart disease Father   . Heart attack Father    Social History  Substance Use Topics  . Smoking status: Never Smoker   . Smokeless tobacco: None  . Alcohol Use: No   OB History    No data available     Review of Systems 10/14 systems reviewed and are negative other than those stated in the HPI    Allergies  Penicillins  Home Medications   Prior to Admission medications   Medication Sig Start Date End Date Taking? Authorizing Provider  acetaminophen (TYLENOL) 325 MG tablet Take 325 mg by mouth every 4 (four) hours as needed for mild pain.    Yes Historical Provider, MD  alum & mag hydroxide-simeth (MAALOX PLUS) 400-400-40 MG/5ML suspension Take 30 mLs by mouth every 6 (six) hours as needed for indigestion.    Yes Historical Provider, MD  aspirin EC 81 MG tablet Take 162 mg by mouth daily.   Yes Historical Provider, MD  diphenhydrAMINE (BENADRYL) 25 mg capsule Take 25 mg by mouth every 6 (six) hours as needed for itching.    Yes Historical Provider, MD  famotidine (  PEPCID) 20 MG tablet Take 20 mg by mouth daily as needed for heartburn or indigestion.   Yes Historical Provider, MD  feeding supplement (BOOST / RESOURCE BREEZE) LIQD Take 240 mLs by mouth daily after breakfast.    Yes Historical Provider, MD  HYDROcodone-acetaminophen (NORCO/VICODIN) 5-325 MG tablet Take one tablet by mouth every 6 hours as needed for pain. Do not exceed 4gm of Tylenol in 24 hours Patient taking differently: Take 1 tablet by mouth every 6 (six) hours as needed. pain. Do not exceed 4gm of Tylenol in 24 hours 10/05/15   Yes Tiffany L Reed, DO  indapamide (LOZOL) 1.25 MG tablet Take 1.25 mg by mouth every morning.   Yes Historical Provider, MD  iodine-sodium iodide 2-2.4 % solution Apply 1 application topically daily as needed (apply to wound).   Yes Historical Provider, MD  losartan (COZAAR) 25 MG tablet Take 25 mg by mouth daily.   Yes Historical Provider, MD  mirtazapine (REMERON) 15 MG tablet Take 15 mg by mouth at bedtime.   Yes Historical Provider, MD  Multiple Vitamin (MULTIVITAMIN WITH MINERALS) TABS tablet Take 1 tablet by mouth daily.   Yes Historical Provider, MD  potassium chloride SA (K-DUR,KLOR-CON) 20 MEQ tablet Take 20 mEq by mouth daily.   Yes Historical Provider, MD  promethazine (PHENERGAN) 25 MG tablet Take 25 mg by mouth every 4 (four) hours as needed for nausea or vomiting.   Yes Historical Provider, MD  rivaroxaban (XARELTO) 20 MG TABS tablet Take 20 mg by mouth daily with breakfast.    Yes Historical Provider, MD  saccharomyces boulardii (FLORASTOR) 250 MG capsule Take 1 capsule (250 mg total) by mouth 2 (two) times daily. 07/03/15  Yes Marinda ElkAbraham Feliz Ortiz, MD  SANTYL ointment Apply 1 application topically 2 (two) times daily. For wound care. 12/25/15  Yes Historical Provider, MD  TENORMIN 50 MG tablet Take 50 mg by mouth daily. 04/06/15  Yes Historical Provider, MD  atenolol (TENORMIN) 50 MG tablet Take 1 tablet (50 mg total) by mouth daily. 01/24/16   Lavera Guiseana Duo Mishawn Hemann, MD  doxycycline (VIBRA-TABS) 100 MG tablet Take 1 tablet (100 mg total) by mouth 2 (two) times daily. Patient not taking: Reported on 01/24/2016 01/20/16   Rhetta MuraJai-Gurmukh Samtani, MD  famotidine (PEPCID) 20 MG tablet Take 1 tablet (20 mg total) by mouth daily. 01/24/16   Lavera Guiseana Duo Gaetana Kawahara, MD  indapamide (LOZOL) 2.5 MG tablet Take 0.5 tablets (1.25 mg total) by mouth daily. 01/24/16   Lavera Guiseana Duo Octa Uplinger, MD  losartan (COZAAR) 25 MG tablet Take 1 tablet (25 mg total) by mouth daily. 01/24/16   Lavera Guiseana Duo Donyae Kilner, MD  mirtazapine (REMERON) 15 MG tablet Take 1  tablet (15 mg total) by mouth at bedtime. 01/24/16   Lavera Guiseana Duo Rhina Kramme, MD  potassium chloride (K-DUR) 10 MEQ tablet Take 2 tablets (20 mEq total) by mouth daily. 01/24/16   Lavera Guiseana Duo Wlliam Grosso, MD  promethazine (PHENERGAN) 25 MG tablet Take 1 tablet (25 mg total) by mouth every 6 (six) hours as needed for nausea or vomiting. 01/24/16   Lavera Guiseana Duo Makelle Marrone, MD  rivaroxaban (XARELTO) 20 MG TABS tablet Take 1 tablet (20 mg total) by mouth daily with supper. 01/24/16   Lavera Guiseana Duo Lashena Signer, MD   BP 189/73 mmHg  Pulse 75  Temp(Src) 97.6 F (36.4 C) (Oral)  Resp 16  Ht 5' (1.524 m)  Wt 120 lb (54.432 kg)  BMI 23.44 kg/m2  SpO2 100% Physical Exam Physical Exam  Nursing note and vitals  reviewed. Constitutional: Well developed, non-toxic, and in no acute distress Head: Normocephalic and atraumatic.  Mouth/Throat: Oropharynx is clear and moist.  Neck: Normal range of motion. Neck supple.  Cardiovascular: Normal rate and regular rhythm.   Pulmonary/Chest: Effort normal and breath sounds normal.  Abdominal: Soft. There is no tenderness. There is no rebound and no guarding.  Musculoskeletal: Right heel ulcer 5-6 cm in diameter estimated with necrotic skin flap. No active drainage or odor. No significant surrounding erythema or induration.  Neurological: Alert, no facial droop, fluent speech, moves all extremities symmetrically Skin: Skin is warm and dry.  Psychiatric: Cooperative  ED Course  Procedures (including critical care time) Labs Review Labs Reviewed  CBC WITH DIFFERENTIAL/PLATELET - Abnormal; Notable for the following:    Hemoglobin 11.3 (*)    HCT 34.3 (*)    Neutro Abs 8.6 (*)    All other components within normal limits  URINALYSIS, ROUTINE W REFLEX MICROSCOPIC (NOT AT [facility]) - Abnormal; Notable for the following:    Color, Urine AMBER (*)    APPearance CLOUDY (*)    Protein, ur 30 (*)    All other components within normal limits  COMPREHENSIVE METABOLIC PANEL - Abnormal; Notable for the following:     Glucose, Bld 105 (*)    All other components within normal limits  URINE MICROSCOPIC-ADD ON - Abnormal; Notable for the following:    Squamous Epithelial / LPF 6-30 (*)    Bacteria, UA RARE (*)    All other components within normal limits  CBG MONITORING, ED    Imaging Review No results found. I have personally reviewed and evaluated these images and lab results as part of my medical decision-making.   EKG Interpretation None      MDM   Final diagnoses:  Osteomyelitis of right foot, unspecified chronicity (HCC)  Chronic foot ulcer, right, with unspecified severity (HCC)    82 year old female who presents with wound check and medication management. She is nontoxic in no acute distress on presentation. She is hypertensive here in the emergency department, but she has been without her medications including her blood pressure medications over the past 2 days. She is not tachycardic and has no fever. She does have a persistent nonhealing right heel ulcer with necrotic skin flap. No significant surrounding erythema, induration or pain and no active drainage. A CBC is without significant leukocytosis. No major nausea later metabolic derangements are noted. Urine is negative. Patient has known osteomyelitis and chronic infection of that foot, but currently no systemic signs or symptoms of illness and she is requesting discharge home but requesting adequate home health care. Patient's son and nieces are involved with the care planning with social work and case management. We have arranged for patient to go to breakdown nursing facility for daily care starting tomorrow. Follow-up with orthopedic surgery is given, and she is encouraged to have definitive management of her wound to prevent worsening infection and worsening disability. Patient has doxycycline to be picked up at her pharmacy, which she has agreed to take. The remainder of her home medications are prescribed to her and given to her family  to fill. Strict return follow-up instructions are reviewed. She and her family express understanding of all discharge instructions, and felt comfortable with the plan of care.    Lavera Guise, MD 01/24/16 [phone]

## 2016-01-24 NOTE — ED Notes (Signed)
Waiting for a family member to come to see pt.  Pt reports daughter is unable to come but will call her niece.

## 2016-01-24 NOTE — ED Notes (Signed)
Bed: [id number] Expected date:  Expected time:  Means of arrival:  Comments: EMS: diabetic foot ulcer

## 2016-01-24 NOTE — ED Notes (Signed)
Per PTAR report: pt was seen and discharged to a nursing facility.  [facility] and the home health nurse came out to assess the pt's status for acceptance for their care.  Pt appears to not have her medications and according to the pt, she has not taken her medication in three days.  PTAR could not find any medication bottles at her home.  Pt is ambulatory and ulcer is on the right ankle.  Pt a/o x 4.

## 2016-01-24 NOTE — Clinical Social Work Note (Signed)
Clinical Social Work Assessment  Patient Details  Name: Jasmine Fernandez MRN: [id number] Date of Birth: 05-02-1929  Date of referral:  01/24/16               Reason for consult:  Other (Comment Required)                Permission sought to share information with:  Family Supports Permission granted to share information::  Yes, Verbal Permission Granted  Name::        Agency::     Relationship::     Contact Information:     Housing/Transportation Living arrangements for the past 2 months:  Single Family Home Source of Information:  Patient Patient Interpreter Needed:  None Criminal Activity/Legal Involvement Pertinent to Current Situation/Hospitalization:  No - Comment as needed Significant Relationships:  Adult Children, Other Family Members (granchildren and neices) Lives with:  Self Do you feel safe going back to the place where you live?  Yes Need for family participation in patient care:  Yes (Comment)  Care giving concerns:  No caregiver   Social Worker assessment / plan:  CSW met with pt and her family at bedside to discuss discharge needs.  CSW prompted pt and family to discuss history and needs.  CSW encouraged pt and family to explore thoughts and feelings related to pt's health and needs.  CSW will send FL2 to [facility] assisted living where pt and her family will be taking her to tomorrow.    Employment status:  Retired (retired Pharmacist, hospital) Nurse, adult PT Recommendations:  Not assessed at this time [place] / Referral to community resources:     Patient/Family's Response to care:  Pt discussed spending the last 7 months in skilled nursing at [facility] and stated that she paid them  Over 10,000 out of pocket.  Pt reported that she has a large family and has been living alone.  She just started with advance home care today and they sent her to hospital because she did not have any medications in her home and she did not have anybody to care  for her in the home.   Patient/Family's Understanding of and Emotional Response to Diagnosis, Current Treatment, and Prognosis:  Pt and family have not been Communicating among themselves and have some confusion with regards to her care.  MD and case manager helped to clarify for her niece, and her son who were at the hospital to take pt home today.  Emotional Assessment Appearance:  Appears stated age Attitude/Demeanor/Rapport:   (cooperative) Affect (typically observed):  Accepting Orientation:  Oriented to Self, Oriented to Place, Oriented to  Time, Oriented to Situation Alcohol / Substance use:    Psych involvement (Current and /or in the community):  No (Comment)  Discharge Needs  Concerns to be addressed:    Readmission within the last 30 days:    Current discharge risk:    Barriers to Discharge:  No Barriers Identified   Carlean Jews, LCSW 01/24/2016, 4:47 PM

## 2016-01-25 NOTE — Progress Notes (Signed)
Received call back from Eudelia BunchSara Black at [facility], states they completed an assessment. She will follow up with pt and family to reassess today. Pt planned to go today to ALF and they do have availability. Spoke to niece, Ileene Patrickadine Edwards [phone]. She states she will be willing to assist with pt getting to [facility] today. Faxed FL2 and H&P to [facility] fax # [phone]. Isidoro DonningAlesia Wyllow Seigler RN CCM Case Mgmt phone [phone]

## 2016-02-08 LAB — MISC LABCORP TEST (SEND OUT): Labcorp test code: 183401

## 2016-02-15 LAB — CULTURE, BLOOD (ROUTINE X 2)

## 2016-02-18 ENCOUNTER — Encounter: Payer: Self-pay | Admitting: *Deleted

## 2016-02-18 ENCOUNTER — Encounter: Payer: Medicare PPO | Admitting: Physician Assistant

## 2016-02-18 NOTE — Progress Notes (Signed)
    Cardiology Office Note   Date:  02/18/2016   ID:  Jasmine Fernandez, DOB [birth year], MRN [id number]  PCP:  Jasmine Fernandez, Jasmine Fernandez, Jasmine Fernandez  Cardiologist:  Dr Venora MaplesKelly  Barrett, Rhonda, PA-C   History of Present Illness: Jasmine Fernandez is a 82 y.o. female with a history of HTN, Goiter, AS, Rheumatic Fever, DM II, DVT on Xarelto, R-SFA 100% (not surgical candidate), L-RAS  Admit 04/28-05/03 for sepsis, worsening R heel wound. AS now severe. Amputation only option, pt refused. Pt refused SNF. Dr Tresa EndoKelly felt infection should be addressed and then decide on treatment for AS. D-CHF w/ stable resp status at d/c.     ECHO: 01/19/2016 - Left ventricle: The cavity size was normal. There was severe  concentric hypertrophy. Systolic function was normal. The  estimated ejection fraction was in the range of 55% to 60%. Wall  motion was normal; there were no regional wall motion  abnormalities. Features are consistent with a pseudonormal left  ventricular filling pattern, with concomitant abnormal relaxation  and increased filling pressure (grade 2 diastolic dysfunction).  Doppler parameters are consistent with high ventricular filling  pressure. - Aortic valve: There was severe stenosis. There was moderate  regurgitation. Valve area (VTI): 0.51 cm^2. Valve area (Vmax):  0.5 cm^2. Valve area (Vmean): 0.52 cm^2. Regurgitation pressure  half-time: 294 ms. - Mitral valve: Calcified annulus. Mobility of the anterior and  posterior leaflet was restricted. The findings are consistent  with moderate stenosis. There was mild regurgitation. Pressure  half-time: 122 ms. Mean gradient (D): 6 mm Hg. Valve area by  pressure half-time: 1.8 cm^2. Valve area by continuity equation  (using LVOT flow): 0.89 cm^2. - Left atrium: The atrium was severely dilated. - Right ventricle: The cavity size was normal. Wall thickness was  normal. Systolic function was normal. - Atrial septum: No defect or patent  foramen ovale was identified. - Tricuspid valve: There was mild regurgitation. - Pulmonary arteries: Systolic pressure was moderately increased.  PA peak pressure: 51 mm Hg (S). - Inferior vena cava: The vessel was normal in size. The  respirophasic diameter changes were in the normal range (>= 50%),  consistent with normal central venous pressure.   This encounter was created in error - please disregard.

## 2016-08-29 ENCOUNTER — Encounter (HOSPITAL_COMMUNITY): Payer: Self-pay

## 2016-08-29 ENCOUNTER — Emergency Department (HOSPITAL_COMMUNITY): Payer: Medicare Other

## 2016-08-29 ENCOUNTER — Inpatient Hospital Stay (HOSPITAL_COMMUNITY)
Admission: EM | Admit: 2016-08-29 | Discharge: 2016-08-31 | DRG: 292 | Disposition: A | Discharge status: Home / Self Care | Payer: Medicare Other | Attending: Internal Medicine | Admitting: Internal Medicine

## 2016-08-29 DIAGNOSIS — I080 Rheumatic disorders of both mitral and aortic valves: Secondary | ICD-10-CM | POA: Diagnosis present

## 2016-08-29 DIAGNOSIS — I110 Hypertensive heart disease with heart failure: Secondary | ICD-10-CM | POA: Diagnosis not present

## 2016-08-29 DIAGNOSIS — Z8249 Family history of ischemic heart disease and other diseases of the circulatory system: Secondary | ICD-10-CM

## 2016-08-29 DIAGNOSIS — Z6824 Body mass index (BMI) 24.0-24.9, adult: Secondary | ICD-10-CM

## 2016-08-29 DIAGNOSIS — E440 Moderate protein-calorie malnutrition: Secondary | ICD-10-CM | POA: Diagnosis present

## 2016-08-29 DIAGNOSIS — L97509 Non-pressure chronic ulcer of other part of unspecified foot with unspecified severity: Secondary | ICD-10-CM | POA: Diagnosis present

## 2016-08-29 DIAGNOSIS — R7989 Other specified abnormal findings of blood chemistry: Secondary | ICD-10-CM

## 2016-08-29 DIAGNOSIS — E11621 Type 2 diabetes mellitus with foot ulcer: Secondary | ICD-10-CM | POA: Diagnosis present

## 2016-08-29 DIAGNOSIS — E049 Nontoxic goiter, unspecified: Secondary | ICD-10-CM | POA: Diagnosis not present

## 2016-08-29 DIAGNOSIS — R778 Other specified abnormalities of plasma proteins: Secondary | ICD-10-CM

## 2016-08-29 DIAGNOSIS — I5033 Acute on chronic diastolic (congestive) heart failure: Secondary | ICD-10-CM | POA: Diagnosis present

## 2016-08-29 DIAGNOSIS — L97419 Non-pressure chronic ulcer of right heel and midfoot with unspecified severity: Secondary | ICD-10-CM | POA: Diagnosis present

## 2016-08-29 DIAGNOSIS — F329 Major depressive disorder, single episode, unspecified: Secondary | ICD-10-CM | POA: Diagnosis present

## 2016-08-29 DIAGNOSIS — R32 Unspecified urinary incontinence: Secondary | ICD-10-CM | POA: Diagnosis present

## 2016-08-29 DIAGNOSIS — I509 Heart failure, unspecified: Secondary | ICD-10-CM

## 2016-08-29 DIAGNOSIS — R918 Other nonspecific abnormal finding of lung field: Secondary | ICD-10-CM | POA: Diagnosis present

## 2016-08-29 DIAGNOSIS — I050 Rheumatic mitral stenosis: Secondary | ICD-10-CM

## 2016-08-29 DIAGNOSIS — I350 Nonrheumatic aortic (valve) stenosis: Secondary | ICD-10-CM

## 2016-08-29 DIAGNOSIS — E079 Disorder of thyroid, unspecified: Secondary | ICD-10-CM

## 2016-08-29 DIAGNOSIS — Z880 Allergy status to penicillin: Secondary | ICD-10-CM | POA: Diagnosis not present

## 2016-08-29 DIAGNOSIS — I5031 Acute diastolic (congestive) heart failure: Secondary | ICD-10-CM | POA: Diagnosis present

## 2016-08-29 DIAGNOSIS — R109 Unspecified abdominal pain: Secondary | ICD-10-CM | POA: Diagnosis present

## 2016-08-29 DIAGNOSIS — K219 Gastro-esophageal reflux disease without esophagitis: Secondary | ICD-10-CM | POA: Diagnosis not present

## 2016-08-29 DIAGNOSIS — I10 Essential (primary) hypertension: Secondary | ICD-10-CM | POA: Diagnosis present

## 2016-08-29 DIAGNOSIS — E119 Type 2 diabetes mellitus without complications: Secondary | ICD-10-CM

## 2016-08-29 NOTE — ED Triage Notes (Signed)
Pt complains of breathing heavy tonight, no cough, she also states that earlier she had a pain on her left side

## 2016-08-29 NOTE — ED Provider Notes (Addendum)
[facility]-EMERGENCY DEPT Provider Note   CSN: [id number] Arrival date & time: 08/29/16  2124 By signing my name below, I, Levon Hedger, attest that this documentation has been prepared under the direction and in the presence of Dione Booze, MD . Electronically Signed: Levon Hedger, Scribe. 08/30/2016. 2:30 AM.   History   Chief Complaint Chief Complaint  Patient presents with  . Flank Pain   HPI Jasmine Fernandez is a 82 y.o. female with a hx of who presents to the Emergency Department complaining of SOB onset a couple of days ago. Shortness of breath is exacerbated by walking; it is unchanged by laying down. Pt notes associated chest tightness.  She states she is ambulatory with a walker at home, despite a wound to her right foot sustained 10 months ago. Pt has been rx santyl for the ulcer which she has applied with relief.  She denies any CP, fever, chills, cough, or leg swelling.   The history is provided by the patient. No language interpreter was used.   Past Medical History:  Diagnosis Date  . Aortic stenosis    a. Echo 01/2016: severe AS, Valve area (VTI): 0.51 cm^2.   . Diabetes mellitus without complication (HCC)    patient states she is not diabetic.   . Goiter   . Hypertension   . Rheumatic fever     Patient Active Problem List   Diagnosis Date Noted  . Severe aortic stenosis   . Non-healing ulcer (HCC)   . Diabetic foot ulcer (HCC) 01/15/2016  . Diabetes mellitus (HCC) 01/15/2016  . Hypokalemia 01/15/2016  . Cellulitis 01/15/2016  . Foot ulcer (HCC) 01/15/2016  . Diabetic foot ulcer associated with type 2 diabetes mellitus (HCC) 01/15/2016  . AKI (acute kidney injury) (HCC) 07/01/2015  . Leukocytosis 07/01/2015  . Malnutrition of moderate degree 06/30/2015  . Weakness 06/29/2015  . Pressure ulcer 06/29/2015  . Fall 06/29/2015  . Hypertension   . Aortic stenosis   . Diabetes mellitus without complication [facility])     Past Surgical History:  Procedure  Laterality Date  . PERIPHERAL VASCULAR CATHETERIZATION N/A 08/19/2015   Procedure: Lower Extremity Angiography;  Surgeon: Nada Libman, MD;  Location: [facility] INVASIVE CV LAB;  Service: Cardiovascular;  Laterality: N/A;  . TONSILLECTOMY     During Childhood    OB History    No data available       Home Medications    Prior to Admission medications   Medication Sig Start Date End Date Taking? Authorizing Provider  atenolol (TENORMIN) 50 MG tablet Take 1 tablet (50 mg total) by mouth daily. 01/24/16  Yes Lavera Guise, MD  potassium chloride (K-DUR) 10 MEQ tablet Take 2 tablets (20 mEq total) by mouth daily. 01/24/16  Yes Lavera Guise, MD  potassium chloride SA (K-DUR,KLOR-CON) 20 MEQ tablet Take 20 mEq by mouth daily.   Yes Historical Provider, MD  SANTYL ointment Apply 1 application topically 2 (two) times daily. For wound care. 12/25/15  Yes Historical Provider, MD  doxycycline (VIBRA-TABS) 100 MG tablet Take 1 tablet (100 mg total) by mouth 2 (two) times daily. Patient not taking: Reported on 01/24/2016 01/20/16   Rhetta Mura, MD  famotidine (PEPCID) 20 MG tablet Take 1 tablet (20 mg total) by mouth daily. Patient not taking: Reported on 08/30/2016 01/24/16   Lavera Guise, MD  HYDROcodone-acetaminophen (NORCO/VICODIN) 5-325 MG tablet Take one tablet by mouth every 6 hours as needed for pain. Do not exceed 4gm of Tylenol  in 24 hours Patient not taking: Reported on 08/30/2016 10/05/15   Tiffany L Reed, DO  indapamide (LOZOL) 2.5 MG tablet Take 0.5 tablets (1.25 mg total) by mouth daily. Patient not taking: Reported on 08/30/2016 01/24/16   Lavera Guiseana Duo Liu, MD  losartan (COZAAR) 25 MG tablet Take 1 tablet (25 mg total) by mouth daily. Patient not taking: Reported on 08/30/2016 01/24/16   Lavera Guiseana Duo Liu, MD  mirtazapine (REMERON) 15 MG tablet Take 1 tablet (15 mg total) by mouth at bedtime. Patient not taking: Reported on 08/30/2016 01/24/16   Lavera Guiseana Duo Liu, MD  promethazine (PHENERGAN) 25 MG tablet Take  1 tablet (25 mg total) by mouth every 6 (six) hours as needed for nausea or vomiting. Patient not taking: Reported on 08/30/2016 01/24/16   Lavera Guiseana Duo Liu, MD  rivaroxaban (XARELTO) 20 MG TABS tablet Take 1 tablet (20 mg total) by mouth daily with supper. Patient not taking: Reported on 08/30/2016 01/24/16   Lavera Guiseana Duo Liu, MD  saccharomyces boulardii (FLORASTOR) 250 MG capsule Take 1 capsule (250 mg total) by mouth 2 (two) times daily. Patient not taking: Reported on 08/30/2016 07/03/15   Marinda ElkAbraham Feliz Ortiz, MD    Family History Family History  Problem Relation Age of Onset  . Heart disease Father   . Heart attack Father     Social History Social History  Substance Use Topics  . Smoking status: Never Smoker  . Smokeless tobacco: Never Used  . Alcohol use No     Allergies   Penicillins  Review of Systems Review of Systems  Constitutional: Negative for chills and fever.  Respiratory: Positive for chest tightness and shortness of breath. Negative for cough.   Cardiovascular: Negative for chest pain and leg swelling.  Skin: Positive for rash.  All other systems reviewed and are negative.  Physical Exam Updated Vital Signs BP 134/73   Pulse 74   Temp 97.9 F (36.6 C) (Oral)   Resp 20   Ht 4\' 11"  (1.499 m)   Wt 139 lb (63 kg)   SpO2 95%   BMI 28.07 kg/m   Physical Exam  Constitutional: She is oriented to person, place, and time. She appears well-developed and well-nourished.  HENT:  Head: Normocephalic and atraumatic.  Eyes: EOM are normal. Pupils are equal, round, and reactive to light.  Neck: Normal range of motion. Neck supple. No JVD present.  Cardiovascular: Normal rate, regular rhythm and normal heart sounds.   No murmur heard. Pulmonary/Chest: Effort normal and breath sounds normal. She has no wheezes. She has no rales. She exhibits no tenderness.  Abdominal: Soft. Bowel sounds are normal. She exhibits no distension and no mass. There is no tenderness.    Musculoskeletal: Normal range of motion. She exhibits no edema.  Stage 2 to stage 3 ulcer to the posterior aspect of her right heel. No drainage or erythema.   Lymphadenopathy:    She has no cervical adenopathy.  Neurological: She is alert and oriented to person, place, and time. No cranial nerve deficit. She exhibits normal muscle tone. Coordination normal.  Skin: Skin is warm and dry. No rash noted.  Psychiatric: She has a normal mood and affect. Her behavior is normal. Judgment and thought content normal.  Nursing note and vitals reviewed.  ED Treatments / Results  DIAGNOSTIC STUDIES:  Oxygen Saturation is 98% on RA, normal by my interpretation.    COORDINATION OF CARE:  12:01 AM Discussed treatment plan with pt at bedside and pt agreed to  plan.  Labs (all labs ordered are listed, but only abnormal results are displayed) Labs Reviewed  COMPREHENSIVE METABOLIC PANEL - Abnormal; Notable for the following:       Result Value   Glucose, Bld 158 (*)    BUN 28 (*)    Creatinine, Ser 1.15 (*)    Total Protein 6.4 (*)    Albumin 3.4 (*)    GFR calc non Af Amer 42 (*)    GFR calc Af Amer 49 (*)    All other components within normal limits  CBC WITH DIFFERENTIAL/PLATELET - Abnormal; Notable for the following:    WBC 12.1 (*)    Neutro Abs 10.0 (*)    All other components within normal limits  TROPONIN I - Abnormal; Notable for the following:    Troponin I 0.07 (*)    All other components within normal limits  BRAIN NATRIURETIC PEPTIDE - Abnormal; Notable for the following:    B Natriuretic Peptide >4,500.0 (*)    All other components within normal limits  D-DIMER, QUANTITATIVE (NOT AT [facility]) - Abnormal; Notable for the following:    D-Dimer, Quant 2.83 (*)    All other components within normal limits    EKG  EKG Interpretation  Date/Time:  Monday August 29 2016 21:51:00 EST Ventricular Rate:  73 PR Interval:    QRS Duration: 95 QT Interval:  421 QTC  Calculation: 464 R Axis:   -46 Text Interpretation:  Sinus rhythm Prolonged PR interval Probable left atrial enlargement Left anterior fascicular block Abnormal R-wave progression, late transition LVH with secondary repolarization abnormality When compared with ECG of 06/29/2015, No significant change was found Confirmed by [facility]  MD, Dalaina Tates ([id number]) on 08/30/2016 12:15:01 AM      Radiology Dg Chest 2 View  Result Date: 08/29/2016 CLINICAL DATA:  82 y/o  F; shortness of breath and cough. EXAM: CHEST  2 VIEW COMPARISON:  06/29/2015 chest radiograph. FINDINGS: Moderate increase in size of the cardiac silhouette. Aortic atherosclerosis with arch calcification. Small bilateral pleural effusions. No focal consolidation of the lungs. Bones are unremarkable. IMPRESSION: 1. Moderate increase in size of cardiac silhouette may represent heart failure or pericardial effusion. 2. Small bilateral pleural effusions.  No focal consolidation. Electronically Signed   By: Mitzi HansenLance  Furusawa-Stratton M.D.   On: 08/29/2016 23:08   Ct Angio Chest Pe W And/or Wo Contrast  Result Date: 08/30/2016 CLINICAL DATA:  82 year old female with shortness of breath and elevated D-dimer. History of rheumatic fever, diabetes, and aortic stenosis. EXAM: CT ANGIOGRAPHY CHEST WITH CONTRAST TECHNIQUE: Multidetector CT imaging of the chest was performed using the standard protocol during bolus administration of intravenous contrast. Multiplanar CT image reconstructions and MIPs were obtained to evaluate the vascular anatomy. CONTRAST:  80 cc Isovue 370 COMPARISON:  Chest radiograph dated 08/29/2016 FINDINGS: Cardiovascular: There is moderate cardiomegaly. No pericardial effusion. Multi vessel coronary vascular calcification involving the left main, lad, and left circumflex artery. There is calcification of the mitral annulus. Retrograde flow of contrast noted from the right atrium into the IVC compatible with a degree of right cardiac  dysfunction. There is atherosclerotic calcification of the thoracic aorta. No aneurysmal dilatation. Evaluation of the aorta is limited due to suboptimal opacification as this study was performed to evaluate for pulmonary artery. There is dilatation of the main pulmonary trunk indicative of underlying pulmonary hypertension. No CT evidence of pulmonary embolism. Mediastinum/Nodes: Top-normal right hilar lymph nodes. The esophagus is grossly unremarkable. There is a 4.7 x 4.2 cm  left thyroid mass/nodule with mild compression and deviation of the upper trachea. Further evaluation with ultrasound is recommended. Lungs/Pleura: There is a small right pleural effusion. Trace left pleural effusion may be present. A 3 mm nodule in the left upper lobe (series 5, image 39) as well as a 3 mm right upper lobe nodule (series 5, image 75). There is mild thickening of the bronchial wall. There is mild interlobular septal prominence likely mild edema. There is no focal consolidation. No pneumothorax. The central airways are patent. Upper Abdomen: Slight irregularity of the hepatic contour. The visualized upper abdomen is otherwise unremarkable. Musculoskeletal: There is diffuse subcutaneous edema and anasarca. No fluid collection. There is degenerative changes of the spine with anterior osteophyte. No acute osseous pathology. Review of the MIP images confirms the above findings. IMPRESSION: No CT evidence of pulmonary embolism. Cardiomegaly with evidence of right cardiac dysfunction. Correlation with echocardiogram recommended. Small right pleural effusion, mild pulmonary edema, and anasarca. A 3 mm right upper lobe as well as a 3 mm left upper lobe pulmonary nodules. **An incidental finding of potential clinical significance has been found. A 4.7 x 4.2 cm left thyroid mass/nodule. Nonemergent ultrasound is recommended for further evaluation.** Electronically Signed   By: Elgie Collard M.D.   On: 08/30/2016 03:51     Procedures Procedures (including critical care time)  Medications Ordered in ED Medications - No data to display   Initial Impression / Assessment and Plan / ED Course  I have reviewed the triage vital signs and the nursing notes.  Pertinent labs & imaging results that were available during my care of the patient were reviewed by me and considered in my medical decision making (see chart for details).  Clinical Course    Dyspnea of uncertain cause. She is somewhat immobile, so at risk for pulmonary embolism. Will get chest x-ray and screen for presence of heart failure. Old records are reviewed, and she has no relevant past visits.  D-dimer has come back elevated and BNP is markedly elevated. She is sent for CT angiogram on pulmonary embolism protocol which showed no evidence of pulmonary emboli. Mass in the thyroid gland was noted that the recommendation for follow-up ultrasound. She is given intravenous furosemide and got a good diuresis. She was implanted in the ED, but oxygen saturations dropped to 85%. Therefore, she will be admitted to proceed with additional diuresis. Of note, there is also slight elevation of troponin which is felt to be due to demand ischemia. Case is discussed with Dr. Clyde Lundborg of [facility] who agrees to admit the patient.  Final Clinical Impressions(s) / ED Diagnoses   Final diagnoses:  Acute congestive heart failure, unspecified congestive heart failure type (HCC)  Elevated troponin I level    New Prescriptions New Prescriptions   No medications on file  I personally performed the services described in this documentation, which was scribed in my presence. The recorded information has been reviewed and is accurate.       Dione Booze, MD 08/30/16 [id number]    Dione Booze, MD 08/30/16 [phone]   After above events occurred, patient stated that she did not 1 to stay. Risks of hypoxia and cardiac ischemia were discussed with the patient with her  daughter present. She expressed understanding and stated she still wanted to go home. She will be discharged AGAINST MEDICAL ADVICE with prescription for furosemide. Care management is being consulted to arrange for home oxygen.   Dione Booze, MD 08/30/16 [phone]

## 2016-08-30 ENCOUNTER — Encounter (HOSPITAL_COMMUNITY): Payer: Self-pay

## 2016-08-30 ENCOUNTER — Emergency Department (HOSPITAL_COMMUNITY): Payer: Medicare Other

## 2016-08-30 ENCOUNTER — Inpatient Hospital Stay (HOSPITAL_COMMUNITY): Payer: Medicare Other

## 2016-08-30 DIAGNOSIS — K219 Gastro-esophageal reflux disease without esophagitis: Secondary | ICD-10-CM | POA: Diagnosis not present

## 2016-08-30 DIAGNOSIS — I5031 Acute diastolic (congestive) heart failure: Secondary | ICD-10-CM | POA: Diagnosis present

## 2016-08-30 DIAGNOSIS — L97503 Non-pressure chronic ulcer of other part of unspecified foot with necrosis of muscle: Secondary | ICD-10-CM | POA: Diagnosis not present

## 2016-08-30 DIAGNOSIS — Z8249 Family history of ischemic heart disease and other diseases of the circulatory system: Secondary | ICD-10-CM | POA: Diagnosis not present

## 2016-08-30 DIAGNOSIS — Z880 Allergy status to penicillin: Secondary | ICD-10-CM | POA: Diagnosis not present

## 2016-08-30 DIAGNOSIS — I080 Rheumatic disorders of both mitral and aortic valves: Secondary | ICD-10-CM | POA: Diagnosis not present

## 2016-08-30 DIAGNOSIS — L97509 Non-pressure chronic ulcer of other part of unspecified foot with unspecified severity: Secondary | ICD-10-CM

## 2016-08-30 DIAGNOSIS — E049 Nontoxic goiter, unspecified: Secondary | ICD-10-CM | POA: Diagnosis not present

## 2016-08-30 DIAGNOSIS — I509 Heart failure, unspecified: Secondary | ICD-10-CM | POA: Insufficient documentation

## 2016-08-30 DIAGNOSIS — L97419 Non-pressure chronic ulcer of right heel and midfoot with unspecified severity: Secondary | ICD-10-CM | POA: Diagnosis not present

## 2016-08-30 DIAGNOSIS — I110 Hypertensive heart disease with heart failure: Secondary | ICD-10-CM | POA: Diagnosis not present

## 2016-08-30 DIAGNOSIS — E11621 Type 2 diabetes mellitus with foot ulcer: Secondary | ICD-10-CM

## 2016-08-30 DIAGNOSIS — R32 Unspecified urinary incontinence: Secondary | ICD-10-CM | POA: Diagnosis not present

## 2016-08-30 DIAGNOSIS — R109 Unspecified abdominal pain: Secondary | ICD-10-CM | POA: Diagnosis present

## 2016-08-30 DIAGNOSIS — Z6824 Body mass index (BMI) 24.0-24.9, adult: Secondary | ICD-10-CM | POA: Diagnosis not present

## 2016-08-30 DIAGNOSIS — R918 Other nonspecific abnormal finding of lung field: Secondary | ICD-10-CM | POA: Diagnosis not present

## 2016-08-30 DIAGNOSIS — I5033 Acute on chronic diastolic (congestive) heart failure: Secondary | ICD-10-CM | POA: Diagnosis not present

## 2016-08-30 DIAGNOSIS — F329 Major depressive disorder, single episode, unspecified: Secondary | ICD-10-CM | POA: Diagnosis not present

## 2016-08-30 DIAGNOSIS — E440 Moderate protein-calorie malnutrition: Secondary | ICD-10-CM | POA: Diagnosis not present

## 2016-08-30 LAB — COMPREHENSIVE METABOLIC PANEL
ALBUMIN: 3.4 g/dL — AB (ref 3.5–5.0)
ALK PHOS: 64 U/L (ref 38–126)
ALT: 16 U/L (ref 14–54)
AST: 24 U/L (ref 15–41)
Anion gap: 7 (ref 5–15)
BILIRUBIN TOTAL: 0.8 mg/dL (ref 0.3–1.2)
BUN: 28 mg/dL — AB (ref 6–20)
CALCIUM: 9.6 mg/dL (ref 8.9–10.3)
CO2: 24 mmol/L (ref 22–32)
Chloride: 107 mmol/L (ref 101–111)
Creatinine, Ser: 1.15 mg/dL — ABNORMAL HIGH (ref 0.44–1.00)
GFR calc Af Amer: 49 mL/min — ABNORMAL LOW (ref >60)
GFR calc non Af Amer: 42 mL/min — ABNORMAL LOW (ref >60)
GLUCOSE: 158 mg/dL — AB (ref 65–99)
Potassium: 4.9 mmol/L (ref 3.5–5.1)
Sodium: 138 mmol/L (ref 135–145)
TOTAL PROTEIN: 6.4 g/dL — AB (ref 6.5–8.1)

## 2016-08-30 LAB — CBC WITH DIFFERENTIAL/PLATELET
BASOS ABS: 0 10*3/uL (ref 0.0–0.1)
BASOS PCT: 0 %
EOS PCT: 0 %
Eosinophils Absolute: 0 10*3/uL (ref 0.0–0.7)
HCT: 41 % (ref 36.0–46.0)
Hemoglobin: 13.5 g/dL (ref 12.0–15.0)
Lymphocytes Relative: 11 %
Lymphs Abs: 1.3 10*3/uL (ref 0.7–4.0)
MCH: 28.5 pg (ref 26.0–34.0)
MCHC: 32.9 g/dL (ref 30.0–36.0)
MCV: 86.7 fL (ref 78.0–100.0)
MONO ABS: 0.7 10*3/uL (ref 0.1–1.0)
MONOS PCT: 6 %
Neutro Abs: 10 10*3/uL — ABNORMAL HIGH (ref 1.7–7.7)
Neutrophils Relative %: 83 %
PLATELETS: 224 10*3/uL (ref 150–400)
RBC: 4.73 MIL/uL (ref 3.87–5.11)
RDW: 13 % (ref 11.5–15.5)
WBC: 12.1 10*3/uL — ABNORMAL HIGH (ref 4.0–10.5)

## 2016-08-30 LAB — TROPONIN I
TROPONIN I: 0.06 ng/mL — AB (ref <0.03)
Troponin I: 0.07 ng/mL (ref <0.03)
Troponin I: 0.07 ng/mL (ref <0.03)
Troponin I: 0.07 ng/mL (ref <0.03)

## 2016-08-30 LAB — D-DIMER, QUANTITATIVE (NOT AT ARMC): D DIMER QUANT: 2.83 ug{FEU}/mL — AB (ref 0.00–0.50)

## 2016-08-30 LAB — CBC
HCT: 39.3 % (ref 36.0–46.0)
HEMOGLOBIN: 12.9 g/dL (ref 12.0–15.0)
MCH: 28.6 pg (ref 26.0–34.0)
MCHC: 32.8 g/dL (ref 30.0–36.0)
MCV: 87.1 fL (ref 78.0–100.0)
PLATELETS: 202 10*3/uL (ref 150–400)
RBC: 4.51 MIL/uL (ref 3.87–5.11)
RDW: 13.1 % (ref 11.5–15.5)
WBC: 12.5 10*3/uL — ABNORMAL HIGH (ref 4.0–10.5)

## 2016-08-30 LAB — BRAIN NATRIURETIC PEPTIDE

## 2016-08-30 LAB — GLUCOSE, CAPILLARY
Glucose-Capillary: 133 mg/dL — ABNORMAL HIGH (ref 65–99)
Glucose-Capillary: 94 mg/dL (ref 65–99)
Glucose-Capillary: 85 mg/dL (ref 65–99)

## 2016-08-30 LAB — CREATININE, SERUM
CREATININE: 1.23 mg/dL — AB (ref 0.44–1.00)
GFR calc Af Amer: 45 mL/min — ABNORMAL LOW (ref >60)
GFR, EST NON AFRICAN AMERICAN: 39 mL/min — AB (ref >60)

## 2016-08-30 LAB — MRSA PCR SCREENING: MRSA BY PCR: POSITIVE — AB

## 2016-08-30 MED ORDER — ONDANSETRON HCL 4 MG/2ML IJ SOLN: 4.0000 mg | Freq: Four times a day (QID) | INTRAMUSCULAR | Status: DC | PRN

## 2016-08-30 MED ORDER — POTASSIUM CHLORIDE CRYS ER 10 MEQ PO TBCR
20.0000 meq | EXTENDED_RELEASE_TABLET | Freq: Every day | ORAL | Status: DC
  Administered 2016-08-30 – 2016-08-31 (×2): 20 meq via ORAL
  Filled 2016-08-30 (×2): qty 2

## 2016-08-30 MED ORDER — SODIUM CHLORIDE 0.9 % IJ SOLN
INTRAMUSCULAR | Status: AC
  Filled 2016-08-30: qty 50

## 2016-08-30 MED ORDER — COLLAGENASE 250 UNIT/GM EX OINT
1.0000 "application " | TOPICAL_OINTMENT | Freq: Every day | CUTANEOUS | Status: DC
  Administered 2016-08-30 – 2016-08-31 (×2): 1 via TOPICAL

## 2016-08-30 MED ORDER — NITROGLYCERIN 2 % TD OINT: 1.0000 [in_us] | TOPICAL_OINTMENT | Freq: Once | TRANSDERMAL | Status: DC

## 2016-08-30 MED ORDER — IOPAMIDOL (ISOVUE-370) INJECTION 76%
100.0000 mL | Freq: Once | INTRAVENOUS | Status: AC | PRN
  Administered 2016-08-30: 80 mL via INTRAVENOUS

## 2016-08-30 MED ORDER — IOPAMIDOL (ISOVUE-370) INJECTION 76%
INTRAVENOUS | Status: AC
  Filled 2016-08-30: qty 100

## 2016-08-30 MED ORDER — ENOXAPARIN SODIUM 30 MG/0.3ML ~~LOC~~ SOLN
30.0000 mg | SUBCUTANEOUS | Status: DC
  Administered 2016-08-30 – 2016-08-31 (×2): 30 mg via SUBCUTANEOUS
  Filled 2016-08-30 (×2): qty 0.3

## 2016-08-30 MED ORDER — INSULIN ASPART 100 UNIT/ML ~~LOC~~ SOLN: 0.0000 [IU] | Freq: Every day | SUBCUTANEOUS | Status: DC

## 2016-08-30 MED ORDER — COLLAGENASE 250 UNIT/GM EX OINT
1.0000 | TOPICAL_OINTMENT | Freq: Two times a day (BID) | CUTANEOUS | Status: DC
Start: 2016-08-30 — End: 2016-08-30
  Filled 2016-08-30: qty 30

## 2016-08-30 MED ORDER — SODIUM CHLORIDE 0.9% FLUSH: 3.0000 mL | INTRAVENOUS | Status: DC | PRN

## 2016-08-30 MED ORDER — FUROSEMIDE 10 MG/ML IJ SOLN
40.0000 mg | Freq: Once | INTRAMUSCULAR | Status: AC
  Administered 2016-08-30: 40 mg via INTRAVENOUS
  Filled 2016-08-30: qty 4

## 2016-08-30 MED ORDER — SODIUM CHLORIDE 0.9 % IV SOLN: 250.0000 mL | INTRAVENOUS | Status: DC | PRN

## 2016-08-30 MED ORDER — FUROSEMIDE 40 MG PO TABS: 40.0000 mg | ORAL_TABLET | Freq: Every day | ORAL | 0 refills | Status: DC

## 2016-08-30 MED ORDER — FUROSEMIDE 10 MG/ML IJ SOLN: 40.0000 mg | Freq: Every day | INTRAMUSCULAR | Status: DC

## 2016-08-30 MED ORDER — ACETAMINOPHEN 325 MG PO TABS
650.0000 mg | ORAL_TABLET | ORAL | Status: DC | PRN
  Administered 2016-08-31: 650 mg via ORAL
  Filled 2016-08-30: qty 2

## 2016-08-30 MED ORDER — SODIUM CHLORIDE 0.9% FLUSH
3.0000 mL | Freq: Two times a day (BID) | INTRAVENOUS | Status: DC
  Administered 2016-08-30 (×2): 3 mL via INTRAVENOUS

## 2016-08-30 MED ORDER — INSULIN ASPART 100 UNIT/ML ~~LOC~~ SOLN
0.0000 [IU] | Freq: Three times a day (TID) | SUBCUTANEOUS | Status: DC
  Administered 2016-08-30 – 2016-08-31 (×2): 1 [IU] via SUBCUTANEOUS

## 2016-08-30 MED ORDER — FUROSEMIDE 10 MG/ML IJ SOLN
40.0000 mg | Freq: Two times a day (BID) | INTRAMUSCULAR | Status: DC
  Administered 2016-08-30 (×2): 40 mg via INTRAVENOUS
  Filled 2016-08-30 (×3): qty 4

## 2016-08-30 NOTE — ED Notes (Signed)
Oxygen applied to pt at 2 liters/min via [place] after pt ambulated and sats dropped

## 2016-08-30 NOTE — ED Notes (Signed)
Ambulated pt in the hall approximately 1825ft Pt c/o of feeling tired, no SOB O2 sat dropped to 75% on RA Used the Steady to transport pt back to room Once pt returned to bed, her O2 sat returned to 96%-98% RA, however it dropped again to the mid-70s Pt then c/o SOB with increased respirations Pt placed on 2 LPM O2 sat returned to 100% RN made aware

## 2016-08-30 NOTE — ED Notes (Signed)
Dressing applied to right heel  Tolerated well

## 2016-08-30 NOTE — Progress Notes (Signed)
This is a no charge note   Pending admission per Dr. Preston FleetingGlick  82 year old lady with past medical history hypertension, diabetes mellitus, aortic stenosis, rheumatic fever, GERD, depression, dCHF, who presents with shortness of breath with oxygen desaturated to 75% on room air. BNP 4500, d-dimer positive at 2.83, but CT angiogram is negative for PE. Clinically consistent with CHF exacerbation. Pt is accepted to tele bed as inpt. 40 mg of lasix was given.   Jasmine HarpXilin Ovadia Lopp, MD  [facility] Pager [phone]  If 7PM-7AM, please contact night-coverage [email or web address] Password [email or web address] 08/30/2016, 6:30 AM

## 2016-08-30 NOTE — ED Notes (Signed)
ED Provider at bedside. 

## 2016-08-30 NOTE — Consult Note (Signed)
WOC Nurse wound consult note Reason for Consult:Chronic healing wound on the right posterior heel, Stage 3 (most likely) Wound type:Pressure  Pressure Ulcer POA: Yes Measurement: 1cm x 3.4cm x 0.2cm    Eighty percent covered by thin layer of fibrinous material, dried serum. 20% pink, moist. Daughter (a Nursing Tech employed by this hospital) is the caregiver at home and reports that wound is about 50% smaller than it was originally. Wound bed:As described above. Drainage (amount, consistency, odor) scant serous Periwound:intact, with evidence of previous wound healing Dressing procedure/placement/frequency: Admitting ME ordered collagenase (Santyl) which is what the patient has been using successfully at home and I will continue that order as I agree she is having the expected outcome.  I will decrease this wound care to once daily as it is indicated by the manufacturer. Patient is in no pain, is pleasant and in agreement with the POC.  Daughter is also in agreement. WOC nursing team will not follow, but will remain available to this patient, the nursing and medical teams.  Please re-consult if needed. Thanks, Ladona MowLaurie Lamarcus Spira, MSN, RN, GNP, Hans EdenCWOCN, CWON-AP, FAAN  Pager# [phone]

## 2016-08-30 NOTE — H&P (Signed)
History and Physical        Hospital Admission Note Date: 08/30/2016  Patient name: Jasmine Fernandez Medical record number: [id number] Date of birth: 1929-05-10 Age: 82 y.o. Gender: female  PCP: Murray Hodgkins, MD   Referring physician: Dr Preston Fleeting  Patient coming from: home    Chief Complaint:  Shortness of breath 2-3 days  HPI: Patient is a 82 year old female with hypertension, diabetes, aortic stenosis, rheumatic fever, history of diastolic CHF EF 55-60% with grade 2 diastolic dysfunction, GERD, depression presented to ED with shortness of breath for last 2-3 days. History was obtained from the patient and the daughter in the room. Per patient, she is otherwise fairly active at baseline, can walk in a grocery store for at least an hour, not on any oxygen at baseline. In the last 2-3 days, she has been feeling short of breath, worse on exertion and walking. She has been feeling short of breath on walking from her bedroom to the bathroom and some chest tightness with it. She usually ambulates with a walker at home. She has a wound to her right foot for last 10 months and has been following [facility]. Otherwise denied any fevers, chills, productive cough, nausea, vomiting, diarrhea. No significant leg swelling.  ED work-up/course:  Temperature 97.6, respiratory rate 18, pulse 71, BP 150/64, O2 sats 100% on 2 L BNP over 4500, troponin 0.07 CT angiogram of the chest showed no pulmonary embolism, cardiomegaly with right cardiac dysfunction, recommended 2-D echo, small right pleural effusion and mild pulmonary edema and anasarca 3 mm right upper lobe and 3 mm left upper lobe pulmonary nodules  Review of Systems: Positives marked in 'bold' Constitutional: Denies fever, chills, diaphoresis, poor appetite and fatigue.  HEENT: Denies photophobia, eye pain, redness, hearing loss, ear  pain, congestion, sore throat, rhinorrhea, sneezing, mouth sores, trouble swallowing, neck pain, neck stiffness and tinnitus.   Respiratory: Please see history of present illness Cardiovascular: + Chest discomfort with shortness of breath, no palpitations and leg swelling. + shortness of breath Gastrointestinal: Denies nausea, vomiting, abdominal pain, diarrhea, constipation, blood in stool and abdominal distention.  Genitourinary: Denies dysuria, urgency, frequency, hematuria, flank pain and difficulty urinating.  Musculoskeletal: Denies myalgias, back pain, joint swelling, arthralgias and gait problem.  Skin: Denies pallor, rash and wound.  Neurological: Denies dizziness, seizures, syncope, weakness, light-headedness, numbness and headaches.  Hematological: Denies adenopathy. Easy bruising, personal or family bleeding history  Psychiatric/Behavioral: Denies suicidal ideation, mood changes, confusion, nervousness, sleep disturbance and agitation  Past Medical History: Past Medical History:  Diagnosis Date  . Aortic stenosis    a. Echo 01/2016: severe AS, Valve area (VTI): 0.51 cm^2.   . Diabetes mellitus without complication (HCC)    patient states she is not diabetic.   . Goiter   . Hypertension   . Rheumatic fever     Past Surgical History:  Procedure Laterality Date  . PERIPHERAL VASCULAR CATHETERIZATION N/A 08/19/2015   Procedure: Lower Extremity Angiography;  Surgeon: Nada Libman, MD;  Location: [facility] INVASIVE CV LAB;  Service: Cardiovascular;  Laterality: N/A;  . TONSILLECTOMY     During Childhood    Medications: Prior to Admission  medications   Medication Sig Start Date End Date Taking? Authorizing Provider  atenolol (TENORMIN) 50 MG tablet Take 1 tablet (50 mg total) by mouth daily. 01/24/16  Yes Lavera Guiseana Duo Liu, MD  potassium chloride (K-DUR) 10 MEQ tablet Take 2 tablets (20 mEq total) by mouth daily. 01/24/16  Yes Lavera Guiseana Duo Liu, MD  potassium chloride SA (K-DUR,KLOR-CON) 20  MEQ tablet Take 20 mEq by mouth daily.   Yes Historical Provider, MD  SANTYL ointment Apply 1 application topically 2 (two) times daily. For wound care. 12/25/15  Yes Historical Provider, MD  furosemide (LASIX) 40 MG tablet Take 1 tablet (40 mg total) by mouth daily. 08/30/16   Dione Boozeavid Glick, MD    Allergies:   Allergies  Allergen Reactions  . Penicillins Swelling    Has patient had a PCN reaction causing immediate rash, facial/tongue/throat swelling, SOB or lightheadedness with hypotension: YES Has patient had a PCN reaction causing severe rash involving mucus membranes or skin necrosis:no Has patient had a PCN reaction that required hospitalization: no Has patient had a PCN reaction occurring within the last 10 years: No If all of the above answers are "NO", then may proceed with Cephalosporin use.     Social History:  reports that she has never smoked. She has never used smokeless tobacco. She reports that she does not drink alcohol or use drugs.  Family History: Family History  Problem Relation Age of Onset  . Heart disease Father   . Heart attack Father     Physical Exam: Blood pressure 137/56, pulse 65, temperature 97.9 F (36.6 C), temperature source Oral, resp. rate 19, height 4\' 11"  (1.499 m), weight 63 kg (139 lb), SpO2 100 %. General: Alert, awake, oriented x3, in no acute distress. HEENT: normocephalic, atraumatic, anicteric sclera, pink conjunctiva, pupils equal and reactive to light and accomodation, oropharynx clear Neck: supple, no masses or lymphadenopathy, no goiter, no bruits +JVP Heart: Regular rate and rhythm, without murmurs, rubs or gallops. Lungs:Bibasilar crackles Abdomen: Soft, nontender, nondistended, positive bowel sounds, no masses. Extremities: No clubbing, cyanosis, 1+ edema with positive pedal pulses. Neuro: Grossly intact, no focal neurological deficits, strength 5/5 upper and lower extremities bilaterally Psych: alert and oriented x 3, normal mood  and affect Skin: Stage II ulcer on the posterior aspect of the right heel   LABS on Admission:  Basic Metabolic Panel:  Recent Labs Lab 08/30/16 0115  NA 138  K 4.9  CL 107  CO2 24  GLUCOSE 158*  BUN 28*  CREATININE 1.15*  CALCIUM 9.6   Liver Function Tests:  Recent Labs Lab 08/30/16 0115  AST 24  ALT 16  ALKPHOS 64  BILITOT 0.8  PROT 6.4*  ALBUMIN 3.4*   No results for input(s): LIPASE, AMYLASE in the last 168 hours. No results for input(s): AMMONIA in the last 168 hours. CBC:  Recent Labs Lab 08/30/16 0115  WBC 12.1*  NEUTROABS 10.0*  HGB 13.5  HCT 41.0  MCV 86.7  PLT 224   Cardiac Enzymes:  Recent Labs Lab 08/30/16 0115  TROPONINI 0.07*   BNP: Invalid input(s): POCBNP CBG: No results for input(s): GLUCAP in the last 168 hours.  Radiological Exams on Admission:  No results found.  *I have personally reviewed the images above*  EKG: Independently reviewed. Rate 73, normal sinus rhythm, T-wave inversions in V5 and V6, LVH   Assessment/Plan Principal Problem:   Acute on chronic diastolic CHF (congestive heart failure) (HCC) - Admitted on CHF pathway, strict I's and  O's and daily weights - BNP > 4500, elevated troponin 0.07, no chest pain currently - Placed on IV Lasix diuresis, 40 mg IV q12hrs  - No beta blocker secondary to acute decompensation -Follow 2-D echo    Mildly elevated troponin  - secondary to acute diastolic CHF, follow 2-D echo   Active Problems:   Hypertension - Currently stable, continue IV Lasix    Malnutrition of moderate degree - Nutrition consult    Diabetes mellitus (HCC) - Obtain hemoglobin A1c, placed on sliding scale insulin   Right  Foot ulcer (HCC) - Per patient, has been following [facility] for last few months - She has been doing Santyl ointment at home, placed wound care consult   DVT prophylaxis:   CODE STATUS:   Consults called: None  Family Communication: Admission, patients  condition and plan of care including tests being ordered have been discussed with the patient and daughter who indicates understanding and agree with the plan and Code Status  Admission status:   Disposition plan: Further plan will depend as patient's clinical course evolves and further radiologic and laboratory data become available  At the time of admission, it appears that the appropriate admission status for this patient is INPATIENT . This is judged to be reasonable and necessary in order to provide the required intensity of service to ensure the patient's safety given the presenting symptoms, physical exam findings, and initial radiographic and laboratory data in the context of their chronic comorbidities.     Time Spent on Admission: 60mins   RAI,RIPUDEEP M.D. [facility] 08/30/2016, 8:24 AM Pager: [phone]  If 7PM-7AM, please contact night-coverage [email or web address] Password [email or web address]

## 2016-08-30 NOTE — ED Notes (Signed)
Hospitalist at bedside and now patient is stating she was to stay in the hospital.

## 2016-08-30 NOTE — ED Notes (Signed)
Pt's daughter in hall stating pt does not wish to be admitted  Explained it is not safe for pt to go home as her oxygen levels drop when pt is ambulating  Daughter states pt still wants to go home  EDP, Preston FleetingGlick notified

## 2016-08-30 NOTE — ED Notes (Signed)
Blood draw unsuccessful, RN notified 

## 2016-08-31 ENCOUNTER — Inpatient Hospital Stay (HOSPITAL_COMMUNITY): Payer: Medicare Other

## 2016-08-31 DIAGNOSIS — I5031 Acute diastolic (congestive) heart failure: Secondary | ICD-10-CM | POA: Diagnosis not present

## 2016-08-31 DIAGNOSIS — I110 Hypertensive heart disease with heart failure: Secondary | ICD-10-CM | POA: Diagnosis not present

## 2016-08-31 DIAGNOSIS — I5033 Acute on chronic diastolic (congestive) heart failure: Secondary | ICD-10-CM | POA: Diagnosis not present

## 2016-08-31 DIAGNOSIS — I509 Heart failure, unspecified: Secondary | ICD-10-CM | POA: Diagnosis not present

## 2016-08-31 DIAGNOSIS — I350 Nonrheumatic aortic (valve) stenosis: Secondary | ICD-10-CM

## 2016-08-31 DIAGNOSIS — I050 Rheumatic mitral stenosis: Secondary | ICD-10-CM

## 2016-08-31 DIAGNOSIS — E079 Disorder of thyroid, unspecified: Secondary | ICD-10-CM

## 2016-08-31 DIAGNOSIS — R109 Unspecified abdominal pain: Secondary | ICD-10-CM | POA: Diagnosis not present

## 2016-08-31 LAB — ECHOCARDIOGRAM COMPLETE
Height: 59 in
WEIGHTICAEL: 1957.68 [oz_av]

## 2016-08-31 LAB — BASIC METABOLIC PANEL
ANION GAP: 9 (ref 5–15)
BUN: 22 mg/dL — AB (ref 6–20)
CALCIUM: 9.1 mg/dL (ref 8.9–10.3)
CO2: 30 mmol/L (ref 22–32)
CREATININE: 1.37 mg/dL — AB (ref 0.44–1.00)
Chloride: 100 mmol/L — ABNORMAL LOW (ref 101–111)
GFR calc Af Amer: 40 mL/min — ABNORMAL LOW (ref >60)
GFR calc non Af Amer: 34 mL/min — ABNORMAL LOW (ref >60)
GLUCOSE: 138 mg/dL — AB (ref 65–99)
Potassium: 3.7 mmol/L (ref 3.5–5.1)
Sodium: 139 mmol/L (ref 135–145)

## 2016-08-31 LAB — GLUCOSE, CAPILLARY
Glucose-Capillary: 92 mg/dL (ref 65–99)
Glucose-Capillary: 125 mg/dL — ABNORMAL HIGH (ref 65–99)

## 2016-08-31 LAB — HEMOGLOBIN A1C
HEMOGLOBIN A1C: 6.3 % — AB (ref 4.8–5.6)
MEAN PLASMA GLUCOSE: 134 mg/dL

## 2016-08-31 MED ORDER — ATENOLOL 50 MG PO TABS
50.0000 mg | ORAL_TABLET | Freq: Every day | ORAL | Status: DC
  Administered 2016-08-31: 50 mg via ORAL
  Filled 2016-08-31: qty 1

## 2016-08-31 MED ORDER — CHLORHEXIDINE GLUCONATE CLOTH 2 % EX PADS
6.0000 | MEDICATED_PAD | Freq: Every day | CUTANEOUS | Status: DC
  Administered 2016-08-31: 6 via TOPICAL

## 2016-08-31 MED ORDER — MUPIROCIN 2 % EX OINT
1.0000 "application " | TOPICAL_OINTMENT | Freq: Two times a day (BID) | CUTANEOUS | Status: DC
  Administered 2016-08-31: 1 via NASAL
  Filled 2016-08-31: qty 22

## 2016-08-31 NOTE — Progress Notes (Signed)
Reviewed discharge information with patient and caregiver. Answered all questions. Patient/caregiver able to teach back medications and reasons to contact MD/911. Patient verbalizes importance of PCP follow up appointment.  Patient/caregiver demonstrate proper wound care. [name] Richrd Soxtaheri [facility] Friday 12/15.  Earnest ConroyBrooke M. Clelia CroftShaw, RN

## 2016-08-31 NOTE — Progress Notes (Signed)
  Echocardiogram 2D Echocardiogram has been performed.  Nolon RodBrown, Tony 08/31/2016, 12:56 PM

## 2016-08-31 NOTE — Discharge Instructions (Signed)
Please stay on a low-salt diet. Please remember that you are free to return at any time.  Your CAT scan did show a mass on your thyroid gland. Please have your primary care provider arrange for an ultrasound of your thyroid gland.

## 2016-08-31 NOTE — Care Management Note (Signed)
Case Management Note  Patient Details  Name: Jasmine Fernandez MRN: [id number] Date of Birth: 12/25/1928  Subjective/Objective:   82 y/o f admitted w/Acute on chronic CHF.Hx: R heel ulcer. From home w/dtr.Has rw,w/c. Dtr states she provides asst w/ADL's, declines [facility] dme rep Carollee HerterShannon aware of 3n1 order, & d/c.                 Action/Plan:dc home.   Expected Discharge Date:   (unknown)               Expected Discharge Plan:  Home/Self Care  In-House Referral:     Discharge planning Services  CM Consult  Post Acute Care Choice:    Choice offered to:     DME Arranged:  3-N-1 DME Agency:  [facility]  HH Arranged:    HH Agency:     Status of Service:  Completed, signed off  If discussed at Long Length of Stay Meetings, dates discussed:    Additional Comments:  Lanier ClamMahabir, Domenica Weightman, RN 08/31/2016, 12:46 PM

## 2016-08-31 NOTE — Discharge Summary (Signed)
Physician Discharge Summary  Jasmine Fernandez:[id number] DOB: [birth year] DOA: 08/29/2016  PCP: Louanna Raw, MD  Admit date: 08/29/2016 Discharge date: 08/31/2016  Admitted From: Home  Disposition:  Home  Recommendations for Outpatient Follow-up:  1. Follow up with cardiology on Friday at already scheduled appointment 2. Please obtain BMP/CBC at follow-up visit 3. Please follow up on the following pending results:  ECHO report 4. PCP:  Please refer for thyroid US and biopsy if not already completed.  Home Health:  Patient and daughter declined  Equipment/Devices:  Family requested a 3 in 1 bedside commode  Discharge Condition:  Stable, improved CODE STATUS:  Full code  Diet recommendation:  Low-sodium, diabetic   Brief/Interim Summary:  Patient is a 82 year old female with hypertension, diabetes, severe aortic stenosis, rheumatic fever, history of diastolic CHF EF 55-60% with grade 2 diastolic dysfunction, GERD, depression, and a healing ulcer on the right heel who presented to ED with shortness of breath for last 2-3 days.  She is fairly active at baseline and can walk in a grocery store for at least an hour, not on any oxygen at baseline. In the last 2-3 days, she had progressive Hurshel Bouillon of breath, worse on exertion and walking.  She had some chest tightness with it.  She usually ambulates with a walker at home.  She denied any fevers, chills, productive cough, nausea, vomiting, diarrhea. No significant leg swelling.  Her BNP was elevated and her chest x-ray demonstrated an enlarged cardiac silhouette with small bilateral pleural effusions.  D-dimer was elevated however her CT angiogram was negative for pulmonary embolism. She was incidentally noted to have 2 small pulmonary nodules and a thyroid mass. She was started on IV diuresis with Lasix and had rapid improvement in her dyspnea.  She felt nearly back to baseline at the time of discharge. She requested discharge home on 12/13 and  declined any further testing or treatment.  I concerned that she and her daughter do not have a full appreciation for the severity of her aortic valve stenosis. They stated that they were told by previous cardiologist that she "will likely live" several more years which is possible, however, unlikely.  Generally in the setting of critical aortic valve stenosis, the prognosis is less than 6 months which I explained today. The daughter and patient stated that they had declined aortic valve replacement or the cardiologist may have recommended against aortic valve replacement at one of their recent visits, but ultimately the plan is not to replace the valve. She may be a hospice candidate. The family values and trusts the opinion of the cardiologists and I will defer further conversations regarding goals of care to them.  Discharge Diagnoses:  Principal Problem:   Acute on chronic diastolic CHF (congestive heart failure) (HCC) Active Problems:   Hypertension   Severe aortic valve stenosis   Malnutrition of moderate degree   Diabetes mellitus (HCC)   Foot ulcer (HCC)   Acute diastolic CHF (congestive heart failure) (HCC)   Thyroid mass   Moderate mitral valve stenosis   Acute respiratory failure with hypoxia secondary to acute on chronic diastolic CHF (congestive heart failure) (HCC) in the setting of severe/critical aortic valve stenosis - BNP > 4500, elevated troponin 0.07 - Placed on IV Lasix diuresis, 40 mg IV q12hrs  -  Due to urinary incontinence, her urine output cannot be accurately recorded - Weights were likely inaccurate due to different scales but may have trended down from 63kg to 55.5kg in 2 days. -  Continue beta blocker -  ARB discontinued -  2-D echo report pending.  Patient and daughter did not want to stay for results.  Mildly elevated troponin but flat 0.06-0.07 and unchanging. - secondary to acute diastolic CHF  Active Problems:   Hypertension, blood pressures remained  stable.    Malnutrition of moderate degree, continue low sodium diet.    Diabetes mellitus (HCC), diet controlled.  A1c 6.3.    Right  Foot ulcer (HCC), healing well - Per patient, has been following [facility] for last few months - She has been doing Santyl ointment at home -  Wound care RN agreed with management plan  Stage 3 left gluteal ulcer 06/30/2015, improving, continue home therapy as before  4.7 x 4.2 cm left thyroid mass/nodule -  Needs outpatient thyroid ultrasound and biopsy if not already complete, if her primary care doctor -  Last TSH 0.847 ib 01/16/2016  3 mm right upper lobe and 3 mm left upper lobe pulmonary nodules.  Never smoker.   Hx of right femoral DVT, previously on xarelto, subsequently discontinued.    Discharge Instructions  Discharge Instructions    (HEART FAILURE PATIENTS) Call MD:  Anytime you have any of the following symptoms: 1) 3 pound weight gain in 24 hours or 5 pounds in 1 week 2) shortness of breath, with or without a dry hacking cough 3) swelling in the hands, feet or stomach 4) if you have to sleep on extra pillows at night in order to breathe.    Complete by:  As directed    Call MD for:  difficulty breathing, headache or visual disturbances    Complete by:  As directed    Call MD for:  extreme fatigue    Complete by:  As directed    Call MD for:  hives    Complete by:  As directed    Call MD for:  persistant dizziness or light-headedness    Complete by:  As directed    Call MD for:  persistant nausea and vomiting    Complete by:  As directed    Call MD for:  severe uncontrolled pain    Complete by:  As directed    Call MD for:  temperature >100.4    Complete by:  As directed    Diet - low sodium heart healthy    Complete by:  As directed    Discharge instructions    Complete by:  As directed    Please continue a low sodium diet (less than 2000 mg per day) and follow up with cardiology at your already scheduled appointment  on Friday for further recommendations.   Increase activity slowly    Complete by:  As directed        Medication List    STOP taking these medications   doxycycline 100 MG tablet Commonly known as:  VIBRA-TABS   famotidine 20 MG tablet Commonly known as:  PEPCID   HYDROcodone-acetaminophen 5-325 MG tablet Commonly known as:  NORCO/VICODIN   indapamide 2.5 MG tablet Commonly known as:  LOZOL   losartan 25 MG tablet Commonly known as:  COZAAR   mirtazapine 15 MG tablet Commonly known as:  REMERON   potassium chloride SA 20 MEQ tablet Commonly known as:  K-DUR,KLOR-CON   promethazine 25 MG tablet Commonly known as:  PHENERGAN   rivaroxaban 20 MG Tabs tablet Commonly known as:  XARELTO   saccharomyces boulardii 250 MG capsule Commonly known as:  [employer]OR  TAKE these medications   atenolol 50 MG tablet Commonly known as:  TENORMIN Take 1 tablet (50 mg total) by mouth daily.   potassium chloride 10 MEQ tablet Commonly known as:  K-DUR Take 2 tablets (20 mEq total) by mouth daily.   SANTYL ointment Generic drug:  collagenase Apply 1 application topically 2 (two) times daily. For wound care.      Follow-up Information    Schedule an appointment as soon as possible for a visit with Murray HodgkinsArthur Green, MD.   Specialty:  Internal Medicine Contact information: [address] [place] [address] [phone]        Inc[facility] - [facility] Follow up.   Why:  bedside commode Contact information: [address] [place] [address] [phone]          Allergies  Allergen Reactions  . Penicillins Swelling    Has patient had a PCN reaction causing immediate rash, facial/tongue/throat swelling, SOB or lightheadedness with hypotension: YES Has patient had a PCN reaction causing severe rash involving mucus membranes or skin necrosis:no Has patient had a PCN reaction that required hospitalization: no Has patient had a PCN reaction  occurring within the last 10 years: No If all of the above answers are "NO", then may proceed with Cephalosporin use.     Consultations: none   Procedures/Studies: Dg Chest 2 View  Result Date: 08/29/2016 CLINICAL DATA:  82 y/o  F; shortness of breath and cough. EXAM: CHEST  2 VIEW COMPARISON:  06/29/2015 chest radiograph. FINDINGS: Moderate increase in size of the cardiac silhouette. Aortic atherosclerosis with arch calcification. Small bilateral pleural effusions. No focal consolidation of the lungs. Bones are unremarkable. IMPRESSION: 1. Moderate increase in size of cardiac silhouette may represent heart failure or pericardial effusion. 2. Small bilateral pleural effusions.  No focal consolidation. Electronically Signed   By: Mitzi HansenLance  Furusawa-Stratton M.D.   On: 08/29/2016 23:08   Ct Angio Chest Pe W And/or Wo Contrast  Result Date: 08/30/2016 CLINICAL DATA:  82 year old female with shortness of breath and elevated D-dimer. History of rheumatic fever, diabetes, and aortic stenosis. EXAM: CT ANGIOGRAPHY CHEST WITH CONTRAST TECHNIQUE: Multidetector CT imaging of the chest was performed using the standard protocol during bolus administration of intravenous contrast. Multiplanar CT image reconstructions and MIPs were obtained to evaluate the vascular anatomy. CONTRAST:  80 cc Isovue 370 COMPARISON:  Chest radiograph dated 08/29/2016 FINDINGS: Cardiovascular: There is moderate cardiomegaly. No pericardial effusion. Multi vessel coronary vascular calcification involving the left main, lad, and left circumflex artery. There is calcification of the mitral annulus. Retrograde flow of contrast noted from the right atrium into the IVC compatible with a degree of right cardiac dysfunction. There is atherosclerotic calcification of the thoracic aorta. No aneurysmal dilatation. Evaluation of the aorta is limited due to suboptimal opacification as this study was performed to evaluate for pulmonary artery. There  is dilatation of the main pulmonary trunk indicative of underlying pulmonary hypertension. No CT evidence of pulmonary embolism. Mediastinum/Nodes: Top-normal right hilar lymph nodes. The esophagus is grossly unremarkable. There is a 4.7 x 4.2 cm left thyroid mass/nodule with mild compression and deviation of the upper trachea. Further evaluation with ultrasound is recommended. Lungs/Pleura: There is a small right pleural effusion. Trace left pleural effusion may be present. A 3 mm nodule in the left upper lobe (series 5, image 39) as well as a 3 mm right upper lobe nodule (series 5, image 75). There is mild thickening of the bronchial wall.  There is mild interlobular septal prominence likely mild edema. There is no focal consolidation. No pneumothorax. The central airways are patent. Upper Abdomen: Slight irregularity of the hepatic contour. The visualized upper abdomen is otherwise unremarkable. Musculoskeletal: There is diffuse subcutaneous edema and anasarca. No fluid collection. There is degenerative changes of the spine with anterior osteophyte. No acute osseous pathology. Review of the MIP images confirms the above findings. IMPRESSION: No CT evidence of pulmonary embolism. Cardiomegaly with evidence of right cardiac dysfunction. Correlation with echocardiogram recommended. Small right pleural effusion, mild pulmonary edema, and anasarca. A 3 mm right upper lobe as well as a 3 mm left upper lobe pulmonary nodules. **An incidental finding of potential clinical significance has been found. A 4.7 x 4.2 cm left thyroid mass/nodule. Nonemergent ultrasound is recommended for further evaluation.** Electronically Signed   By: Elgie Collard M.D.   On: 08/30/2016 03:51    Subjective: Feels back to her baseline. She declined assessment by physical and occupational therapy. She also declined home health physical and occupational therapy and RN. 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  Discharge Exam: Vitals:   08/30/16 2022 08/31/16 0416  BP: (!) 143/58 120/71  Pulse: 72 64  Resp: 16 17  Temp: 98.7 F (37.1 C) 98.4 F (36.9 C)   Vitals:   08/30/16 1300 08/30/16 1601 08/30/16 2022 08/31/16 0416  BP: (!) 172/60  (!) 143/58 120/71  Pulse: 66  72 64  Resp: 18  16 17   Temp: 98 F (36.7 C)  98.7 F (37.1 C) 98.4 F (36.9 C)  TempSrc: Oral  Oral Oral  SpO2: 100%  97% 100%  Weight:  54.6 kg (120 lb 6.4 oz)  55.5 kg (122 lb 5.7 oz)  Height:        General: Pt is alert, awake, not in acute distress, well-appearing Cardiovascular: RRR, 2/6 systolic murmur, loud S2, no rubs, no gallops Respiratory: CTA bilaterally, no wheezing, no rhonchi Abdominal: Soft, NT, ND, bowel sounds + Extremities: no edema, no cyanosis.  Right heel ulcer 1 x 2 cm, without odor or surrounding erythema or induration.  Does not appear infected.      The results of significant diagnostics from this hospitalization (including imaging, microbiology, ancillary and laboratory) are listed below for reference.     Microbiology: Recent Results (from the past 240 hour(s))  MRSA PCR Screening     Status: Abnormal   Collection Time: 08/30/16  9:51 AM  Result Value Ref Range Status   MRSA by PCR POSITIVE (A) NEGATIVE Final    Comment:        The GeneXpert MRSA Assay (FDA approved for NASAL specimens only), is one component of a comprehensive MRSA colonization surveillance program. It is not intended to diagnose MRSA infection nor to guide or monitor treatment for MRSA infections. RESULT CALLED TO, READ BACK BY AND VERIFIED WITH: HUFF,J @ 1351 ON 121217 BY POTEAT,S      Labs: BNP (last 3 results)  Recent Labs  08/30/16 0115  BNP >4,500.0*   Basic Metabolic Panel:  Recent Labs Lab 08/30/16 0115 08/30/16 1001 08/31/16 0525  NA 138  --  139  K 4.9  --  3.7  CL 107   --  100*  CO2 24  --  30  GLUCOSE 158*  --  138*  BUN 28*  --  22*  CREATININE 1.15*  1.23* 1.37*  CALCIUM 9.6  --  9.1   Liver Function Tests:  Recent Labs Lab 08/30/16 0115  AST 24  ALT 16  ALKPHOS 64  BILITOT 0.8  PROT 6.4*  ALBUMIN 3.4*   No results for input(s): LIPASE, AMYLASE in the last 168 hours. No results for input(s): AMMONIA in the last 168 hours. CBC:  Recent Labs Lab 08/30/16 0115 08/30/16 1001  WBC 12.1* 12.5*  NEUTROABS 10.0*  --   HGB 13.5 12.9  HCT 41.0 39.3  MCV 86.7 87.1  PLT 224 202   Cardiac Enzymes:  Recent Labs Lab 08/30/16 0115 08/30/16 1001 08/30/16 1544 08/30/16 2208  TROPONINI 0.07* 0.07* 0.07* 0.06*   BNP: Invalid input(s): POCBNP CBG:  Recent Labs Lab 08/30/16 1106 08/30/16 1626 08/30/16 2031 08/31/16 0816 08/31/16 1202  GLUCAP 133* 94 85 92 125*   D-Dimer  Recent Labs  08/30/16 0115  DDIMER 2.83*   Hgb A1c  Recent Labs  08/30/16 1001  HGBA1C 6.3*   Lipid Profile No results for input(s): CHOL, HDL, LDLCALC, TRIG, CHOLHDL, LDLDIRECT in the last 72 hours. Thyroid function studies No results for input(s): TSH, T4TOTAL, T3FREE, THYROIDAB in the last 72 hours.  Invalid input(s): FREET3 Anemia work up No results for input(s): VITAMINB12, FOLATE, FERRITIN, TIBC, IRON, RETICCTPCT in the last 72 hours. Urinalysis    Component Value Date/Time   COLORURINE AMBER (A) 01/24/2016 1315   APPEARANCEUR CLOUDY (A) 01/24/2016 1315   LABSPEC 1.028 01/24/2016 1315   PHURINE 5.5 01/24/2016 1315   GLUCOSEU NEGATIVE 01/24/2016 1315   HGBUR NEGATIVE 01/24/2016 1315   BILIRUBINUR NEGATIVE 01/24/2016 1315   KETONESUR NEGATIVE 01/24/2016 1315   PROTEINUR 30 (A) 01/24/2016 1315   UROBILINOGEN 0.2 06/29/2015 1842   NITRITE NEGATIVE 01/24/2016 1315   LEUKOCYTESUR NEGATIVE 01/24/2016 1315   Sepsis Labs Invalid input(s): PROCALCITONIN,  WBC,  LACTICIDVEN   Time coordinating discharge: Over 30  minutes  SIGNED:   Renae Fickle, MD  [facility] 08/31/2016, 2:58 PM Pager   If 7PM-7AM, please contact night-coverage [email or web address] Password [email or web address]

## 2016-10-10 ENCOUNTER — Emergency Department (HOSPITAL_COMMUNITY): Payer: Medicare Other

## 2016-10-10 ENCOUNTER — Inpatient Hospital Stay (HOSPITAL_COMMUNITY)
Admission: EM | Admit: 2016-10-10 | Discharge: 2016-10-20 | DRG: 064 | Disposition: E | Payer: Medicare Other | Attending: Pulmonary Disease | Admitting: Pulmonary Disease

## 2016-10-10 DIAGNOSIS — R4020 Unspecified coma: Secondary | ICD-10-CM | POA: Diagnosis present

## 2016-10-10 DIAGNOSIS — J9601 Acute respiratory failure with hypoxia: Secondary | ICD-10-CM | POA: Diagnosis present

## 2016-10-10 DIAGNOSIS — I214 Non-ST elevation (NSTEMI) myocardial infarction: Secondary | ICD-10-CM | POA: Diagnosis present

## 2016-10-10 DIAGNOSIS — I160 Hypertensive urgency: Secondary | ICD-10-CM

## 2016-10-10 DIAGNOSIS — E049 Nontoxic goiter, unspecified: Secondary | ICD-10-CM | POA: Diagnosis present

## 2016-10-10 DIAGNOSIS — H18419 Arcus senilis, unspecified eye: Secondary | ICD-10-CM | POA: Diagnosis present

## 2016-10-10 DIAGNOSIS — R402434 Glasgow coma scale score 3-8, 24 hours or more after hospital admission: Secondary | ICD-10-CM | POA: Diagnosis present

## 2016-10-10 DIAGNOSIS — J988 Other specified respiratory disorders: Secondary | ICD-10-CM | POA: Diagnosis not present

## 2016-10-10 DIAGNOSIS — D751 Secondary polycythemia: Secondary | ICD-10-CM | POA: Diagnosis present

## 2016-10-10 DIAGNOSIS — J9690 Respiratory failure, unspecified, unspecified whether with hypoxia or hypercapnia: Secondary | ICD-10-CM

## 2016-10-10 DIAGNOSIS — R4189 Other symptoms and signs involving cognitive functions and awareness: Secondary | ICD-10-CM | POA: Diagnosis not present

## 2016-10-10 DIAGNOSIS — L89611 Pressure ulcer of right heel, stage 1: Secondary | ICD-10-CM | POA: Diagnosis present

## 2016-10-10 DIAGNOSIS — I63439 Cerebral infarction due to embolism of unspecified posterior cerebral artery: Principal | ICD-10-CM | POA: Diagnosis present

## 2016-10-10 DIAGNOSIS — N189 Chronic kidney disease, unspecified: Secondary | ICD-10-CM | POA: Diagnosis present

## 2016-10-10 DIAGNOSIS — Z0189 Encounter for other specified special examinations: Secondary | ICD-10-CM

## 2016-10-10 DIAGNOSIS — G9340 Encephalopathy, unspecified: Secondary | ICD-10-CM | POA: Diagnosis present

## 2016-10-10 DIAGNOSIS — Z8249 Family history of ischemic heart disease and other diseases of the circulatory system: Secondary | ICD-10-CM

## 2016-10-10 DIAGNOSIS — E1165 Type 2 diabetes mellitus with hyperglycemia: Secondary | ICD-10-CM | POA: Diagnosis present

## 2016-10-10 DIAGNOSIS — R402431 Glasgow coma scale score 3-8, in the field [EMT or ambulance]: Secondary | ICD-10-CM | POA: Diagnosis not present

## 2016-10-10 DIAGNOSIS — E872 Acidosis: Secondary | ICD-10-CM | POA: Diagnosis present

## 2016-10-10 DIAGNOSIS — I5022 Chronic systolic (congestive) heart failure: Secondary | ICD-10-CM | POA: Diagnosis present

## 2016-10-10 DIAGNOSIS — I248 Other forms of acute ischemic heart disease: Secondary | ICD-10-CM | POA: Diagnosis present

## 2016-10-10 DIAGNOSIS — R9401 Abnormal electroencephalogram [EEG]: Secondary | ICD-10-CM | POA: Diagnosis not present

## 2016-10-10 DIAGNOSIS — I050 Rheumatic mitral stenosis: Secondary | ICD-10-CM | POA: Diagnosis present

## 2016-10-10 DIAGNOSIS — Z66 Do not resuscitate: Secondary | ICD-10-CM | POA: Diagnosis not present

## 2016-10-10 DIAGNOSIS — Z79899 Other long term (current) drug therapy: Secondary | ICD-10-CM

## 2016-10-10 DIAGNOSIS — Z22322 Carrier or suspected carrier of Methicillin resistant Staphylococcus aureus: Secondary | ICD-10-CM

## 2016-10-10 DIAGNOSIS — N179 Acute kidney failure, unspecified: Secondary | ICD-10-CM | POA: Diagnosis present

## 2016-10-10 DIAGNOSIS — Z8673 Personal history of transient ischemic attack (TIA), and cerebral infarction without residual deficits: Secondary | ICD-10-CM

## 2016-10-10 DIAGNOSIS — I130 Hypertensive heart and chronic kidney disease with heart failure and stage 1 through stage 4 chronic kidney disease, or unspecified chronic kidney disease: Secondary | ICD-10-CM | POA: Diagnosis present

## 2016-10-10 DIAGNOSIS — I63429 Cerebral infarction due to embolism of unspecified anterior cerebral artery: Secondary | ICD-10-CM | POA: Diagnosis present

## 2016-10-10 DIAGNOSIS — I469 Cardiac arrest, cause unspecified: Secondary | ICD-10-CM | POA: Diagnosis not present

## 2016-10-10 DIAGNOSIS — E1122 Type 2 diabetes mellitus with diabetic chronic kidney disease: Secondary | ICD-10-CM | POA: Diagnosis present

## 2016-10-10 DIAGNOSIS — I638 Other cerebral infarction: Secondary | ICD-10-CM | POA: Diagnosis not present

## 2016-10-10 DIAGNOSIS — A419 Sepsis, unspecified organism: Secondary | ICD-10-CM | POA: Diagnosis present

## 2016-10-10 DIAGNOSIS — R402432 Glasgow coma scale score 3-8, at arrival to emergency department: Secondary | ICD-10-CM | POA: Diagnosis present

## 2016-10-10 DIAGNOSIS — Z880 Allergy status to penicillin: Secondary | ICD-10-CM

## 2016-10-10 DIAGNOSIS — R569 Unspecified convulsions: Secondary | ICD-10-CM | POA: Diagnosis not present

## 2016-10-10 DIAGNOSIS — E11621 Type 2 diabetes mellitus with foot ulcer: Secondary | ICD-10-CM | POA: Diagnosis present

## 2016-10-10 DIAGNOSIS — I472 Ventricular tachycardia: Secondary | ICD-10-CM | POA: Diagnosis not present

## 2016-10-10 DIAGNOSIS — J189 Pneumonia, unspecified organism: Secondary | ICD-10-CM | POA: Diagnosis present

## 2016-10-10 DIAGNOSIS — I350 Nonrheumatic aortic (valve) stenosis: Secondary | ICD-10-CM | POA: Diagnosis present

## 2016-10-10 DIAGNOSIS — I639 Cerebral infarction, unspecified: Secondary | ICD-10-CM | POA: Diagnosis present

## 2016-10-10 DIAGNOSIS — I359 Nonrheumatic aortic valve disorder, unspecified: Secondary | ICD-10-CM | POA: Diagnosis not present

## 2016-10-10 DIAGNOSIS — R748 Abnormal levels of other serum enzymes: Secondary | ICD-10-CM | POA: Diagnosis not present

## 2016-10-10 DIAGNOSIS — E739 Lactose intolerance, unspecified: Secondary | ICD-10-CM | POA: Diagnosis present

## 2016-10-10 LAB — I-STAT TROPONIN, ED: Troponin i, poc: 0.07 ng/mL (ref 0.00–0.08)

## 2016-10-10 LAB — CBC
HCT: 47.9 % — ABNORMAL HIGH (ref 36.0–46.0)
HEMOGLOBIN: 15.5 g/dL — AB (ref 12.0–15.0)
MCH: 27.9 pg (ref 26.0–34.0)
MCHC: 32.4 g/dL (ref 30.0–36.0)
MCV: 86.3 fL (ref 78.0–100.0)
Platelets: 125 10*3/uL — ABNORMAL LOW (ref 150–400)
RBC: 5.55 MIL/uL — AB (ref 3.87–5.11)
RDW: 13.7 % (ref 11.5–15.5)
WBC: 12.5 10*3/uL — ABNORMAL HIGH (ref 4.0–10.5)

## 2016-10-10 LAB — I-STAT ARTERIAL BLOOD GAS, ED
ACID-BASE DEFICIT: 11 mmol/L — AB (ref 0.0–2.0)
BICARBONATE: 15.8 mmol/L — AB (ref 20.0–28.0)
O2 SAT: 100 %
PO2 ART: 429 mmHg — AB (ref 83.0–108.0)
TCO2: 17 mmol/L (ref 0–100)
pCO2 arterial: 39 mmHg (ref 32.0–48.0)
pH, Arterial: 7.215 — ABNORMAL LOW (ref 7.350–7.450)

## 2016-10-10 LAB — COMPREHENSIVE METABOLIC PANEL
ALBUMIN: 3.6 g/dL (ref 3.5–5.0)
ALK PHOS: 69 U/L (ref 38–126)
ALT: 14 U/L (ref 14–54)
AST: 26 U/L (ref 15–41)
Anion gap: 15 (ref 5–15)
BILIRUBIN TOTAL: 1.3 mg/dL — AB (ref 0.3–1.2)
BUN: 19 mg/dL (ref 6–20)
CO2: 18 mmol/L — ABNORMAL LOW (ref 22–32)
CREATININE: 1.37 mg/dL — AB (ref 0.44–1.00)
Calcium: 10.1 mg/dL (ref 8.9–10.3)
Chloride: 106 mmol/L (ref 101–111)
GFR calc Af Amer: 40 mL/min — ABNORMAL LOW (ref >60)
GFR calc non Af Amer: 34 mL/min — ABNORMAL LOW (ref >60)
GLUCOSE: 171 mg/dL — AB (ref 65–99)
Potassium: 4.8 mmol/L (ref 3.5–5.1)
Sodium: 139 mmol/L (ref 135–145)
TOTAL PROTEIN: 7 g/dL (ref 6.5–8.1)

## 2016-10-10 LAB — DIFFERENTIAL
Basophils Absolute: 0.1 10*3/uL (ref 0.0–0.1)
Basophils Relative: 1 %
EOS PCT: 1 %
Eosinophils Absolute: 0.1 10*3/uL (ref 0.0–0.7)
LYMPHS ABS: 3.8 10*3/uL (ref 0.7–4.0)
LYMPHS PCT: 31 %
Monocytes Absolute: 0.3 10*3/uL (ref 0.1–1.0)
Monocytes Relative: 3 %
NEUTROS PCT: 64 %
Neutro Abs: 8.2 10*3/uL — ABNORMAL HIGH (ref 1.7–7.7)

## 2016-10-10 LAB — I-STAT CHEM 8, ED
BUN: 23 mg/dL — AB (ref 6–20)
CALCIUM ION: 1.16 mmol/L (ref 1.15–1.40)
Chloride: 107 mmol/L (ref 101–111)
Creatinine, Ser: 1.2 mg/dL — ABNORMAL HIGH (ref 0.44–1.00)
Glucose, Bld: 175 mg/dL — ABNORMAL HIGH (ref 65–99)
HEMATOCRIT: 49 % — AB (ref 36.0–46.0)
Hemoglobin: 16.7 g/dL — ABNORMAL HIGH (ref 12.0–15.0)
Potassium: 4.5 mmol/L (ref 3.5–5.1)
Sodium: 141 mmol/L (ref 135–145)
TCO2: 21 mmol/L (ref 0–100)

## 2016-10-10 LAB — PROTIME-INR
INR: 1.19
Prothrombin Time: 15.1 seconds (ref 11.4–15.2)

## 2016-10-10 LAB — APTT: aPTT: 27 seconds (ref 24–36)

## 2016-10-10 LAB — ETHANOL: Alcohol, Ethyl (B): <5 mg/dL (ref <5)

## 2016-10-10 MED ORDER — FENTANYL CITRATE (PF) 100 MCG/2ML IJ SOLN: 50.0000 ug | INTRAMUSCULAR | Status: DC | PRN

## 2016-10-10 MED ORDER — FENTANYL CITRATE (PF) 100 MCG/2ML IJ SOLN
50.0000 ug | INTRAMUSCULAR | Status: DC | PRN
  Administered 2016-10-12: 50 ug via INTRAVENOUS
  Filled 2016-10-10: qty 2

## 2016-10-10 MED ORDER — PANTOPRAZOLE SODIUM 40 MG IV SOLR
40.0000 mg | Freq: Every day | INTRAVENOUS | Status: DC
  Administered 2016-10-11: 40 mg via INTRAVENOUS
  Filled 2016-10-10: qty 40

## 2016-10-10 MED ORDER — HEPARIN SODIUM (PORCINE) 5000 UNIT/ML IJ SOLN
5000.0000 [IU] | Freq: Three times a day (TID) | INTRAMUSCULAR | Status: DC
  Administered 2016-10-11 – 2016-10-12 (×4): 5000 [IU] via SUBCUTANEOUS
  Filled 2016-10-10 (×4): qty 1

## 2016-10-10 MED ORDER — SODIUM CHLORIDE 0.9 % IV SOLN: 250.0000 mL | INTRAVENOUS | Status: DC | PRN

## 2016-10-10 MED ORDER — PROPOFOL 1000 MG/100ML IV EMUL
0.0000 ug/kg/min | INTRAVENOUS | Status: DC
  Administered 2016-10-10: 50 ug/kg/min via INTRAVENOUS
  Administered 2016-10-11 – 2016-10-12 (×3): 20 ug/kg/min via INTRAVENOUS
  Administered 2016-10-12: 10 ug/kg/min via INTRAVENOUS
  Filled 2016-10-10 (×7): qty 100

## 2016-10-10 MED ORDER — PROPOFOL 1000 MG/100ML IV EMUL
INTRAVENOUS | Status: AC
  Administered 2016-10-10: 50 ug/kg/min via INTRAVENOUS
  Filled 2016-10-10: qty 100

## 2016-10-10 MED ORDER — SODIUM CHLORIDE 0.9 % IV SOLN
INTRAVENOUS | Status: DC
  Administered 2016-10-11 – 2016-10-12 (×5): via INTRAVENOUS

## 2016-10-10 MED ORDER — SODIUM CHLORIDE 0.9 % IV BOLUS (SEPSIS)
1000.0000 mL | Freq: Once | INTRAVENOUS | Status: AC
Start: 2016-10-10 — End: 2016-10-10
  Administered 2016-10-10: 1000 mL via INTRAVENOUS

## 2016-10-10 NOTE — Progress Notes (Signed)
Pt intubated by MD. 7.0 @ 20 @ equal BBS and color change CO2.  Pt transported to and from CT on vent.  RT will continue to monitor.

## 2016-10-10 NOTE — Consult Note (Signed)
Admission H&P    Chief Complaint: Unresponsive state.  HPI: Jasmine Fernandez is an 83 y.o. female with a history of rheumatic fever, hypertension, diabetes mellitus, aortic stenosis, CHF and syncopal spells, brought to the ED and code stroke status following acute onset of loss of consciousness. Her daughter noted that she slumped over while sitting and became unresponsive at about 8:45 PM tonight. No tonic myoclonic activity was described. Patient has no history of seizures. She maintained an easily palpable pulse. Blood pressure in the ED was elevated at 180/110. She was intubated in the ED for airway protection and placed on mechanical ventilation. CT scan of her head showed no acute intracranial abnormality. 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 Blood sugar in the ED was 175. Electrolytes were unremarkable. WBC count was 12.5, and unchanged from one month ago. Urinalysis and urine drug screen pending.  Past Medical History:  Diagnosis Date  . Aortic stenosis    a. Echo 01/2016: severe AS, Valve area (VTI): 0.51 cm^2.   . Diabetes mellitus without complication (HCC)    patient states she is not diabetic.   . Goiter   . Hypertension   . Rheumatic fever     Past Surgical History:  Procedure Laterality Date  . PERIPHERAL VASCULAR CATHETERIZATION N/A 08/19/2015   Procedure: Lower Extremity Angiography;  Surgeon: Nada LibmanVance W Brabham, MD;  [facility];  Service: Cardiovascular;  Laterality: N/A;  . TONSILLECTOMY     During Childhood    Family History  Problem Relation Age of Onset  . Heart disease Father   . Heart attack Father    Social History:  reports that she has never smoked. She has never used smokeless tobacco. She reports that she does not drink alcohol or use drugs.  Allergies:  Allergies  Allergen Reactions  . Penicillins Swelling    Swelling and itching Has patient had a PCN  reaction causing immediate rash, facial/tongue/throat swelling, SOB or lightheadedness with hypotension: YES Has patient had a PCN reaction causing severe rash involving mucus membranes or skin necrosis:no Has patient had a PCN reaction that required hospitalization: no Has patient had a PCN reaction occurring within the last 10 years: No If all of the above answers are "NO", then may proceed with Cephalosporin use.   Marland Kitchen. Penicillin G Swelling  . Lac Bovis Diarrhea    Medications: Preadmission medications were reviewed by me.  ROS: Unavailable due to the patient's unresponsive state.  Physical Examination: Blood pressure (!) 159/107, pulse 71, resp. rate (!) 37, weight 59.3 kg (130 lb 11.7 oz), SpO2 100 %.  HEENT-  Normocephalic, no lesions, without obvious abnormality.  Normal external eye and conjunctiva.  Normal TM's bilaterally.  Normal auditory canals and external ears. Normal external nose, mucus membranes and septum.  Normal pharynx. Neck supple with no masses, nodes, nodules or enlargement. Cardiovascular - regular rate and rhythm, S1, S2 normal, no murmur, click, rub or gallop Lungs - chest clear, no wheezing, rales, normal symmetric air entry Abdomen - soft, non-tender; bowel sounds normal; no masses,  no organomegaly Extremities - ulcer involving distal right lower extremity  Neurologic Examination: Patient was unconscious and minimally responsive to noxious stimuli. Pupils were 3 mm and equal and reacted minimally to light. Patient had somewhat roving eye movements which were conjugate. There was no clear gaze preference in any direction. Face was symmetrical with no signs of focal weakness. Patient moved all extremities  spontaneously, but moved extremities more frequently than right extremities. Deep tendon reflexes were slightly asymmetrical with greater responses recorded from the left extremities compared to right extremities. Plantar responses were mute.  Results for  orders placed or performed during the hospital encounter of 10/09/2016 (from the past 48 hour(s))  Protime-INR     Status: None   Collection Time: 10/11/2016  9:50 PM  Result Value Ref Range   Prothrombin Time 15.1 11.4 - 15.2 seconds   INR 1.19   APTT     Status: None   Collection Time: 09/20/2016  9:50 PM  Result Value Ref Range   aPTT 27 24 - 36 seconds  CBC     Status: Abnormal   Collection Time: 10/03/2016  9:50 PM  Result Value Ref Range   WBC 12.5 (H) 4.0 - 10.5 K/uL   RBC 5.55 (H) 3.87 - 5.11 MIL/uL   Hemoglobin 15.5 (H) 12.0 - 15.0 g/dL   HCT 09.8 (H) 11.9 - 14.7 %   MCV 86.3 78.0 - 100.0 fL   MCH 27.9 26.0 - 34.0 pg   MCHC 32.4 30.0 - 36.0 g/dL   RDW 82.9 56.2 - 13.0 %   Platelets 125 (L) 150 - 400 K/uL  Differential     Status: Abnormal   Collection Time: 09/21/2016  9:50 PM  Result Value Ref Range   Neutrophils Relative % 64 %   Neutro Abs 8.2 (H) 1.7 - 7.7 K/uL   Lymphocytes Relative 31 %   Lymphs Abs 3.8 0.7 - 4.0 K/uL   Monocytes Relative 3 %   Monocytes Absolute 0.3 0.1 - 1.0 K/uL   Eosinophils Relative 1 %   Eosinophils Absolute 0.1 0.0 - 0.7 K/uL   Basophils Relative 1 %   Basophils Absolute 0.1 0.0 - 0.1 K/uL  I-stat troponin, ED (not at [facility], [facility])     Status: None   Collection Time: 10/04/2016  9:53 PM  Result Value Ref Range   Troponin i, poc 0.07 0.00 - 0.08 ng/mL   Comment 3            Comment: Due to the release kinetics of cTnI, a negative result within the first hours of the onset of symptoms does not rule out myocardial infarction with certainty. If myocardial infarction is still suspected, repeat the test at appropriate intervals.   I-Stat Chem 8, ED  (not at [facility], [facility])     Status: Abnormal   Collection Time: 09/19/2016  9:56 PM  Result Value Ref Range   Sodium 141 135 - 145 mmol/L   Potassium 4.5 3.5 - 5.1 mmol/L   Chloride 107 101 - 111 mmol/L   BUN 23 (H) 6 - 20 mg/dL   Creatinine, Ser 8.65 (H) 0.44 - 1.00 mg/dL   Glucose, Bld 784 (H) 65 - 99  mg/dL   Calcium, Ion 6.96 2.95 - 1.40 mmol/L   TCO2 21 0 - 100 mmol/L   Hemoglobin 16.7 (H) 12.0 - 15.0 g/dL   HCT 28.4 (H) 13.2 - 44.0 %   No results found.  Assessment/Plan 83 year old lady with a history of rheumatic fever, CHF, aortic stenosis and diabetes mellitus, as well as a history of syncopal spells, presenting following the onset of acute loss of consciousness. Etiology for loss of consciousness remains unclear. No seizure activity was described prior to having in the ED nor after arrival. Exam showed no signs of acute stroke, nor acute focal abnormality otherwise. CT scan was unremarkable for acute findings. Laboratory  studies were also unremarkable. Urinalysis and urine drug screen are pending.  Recommendations: 1. MRI of the brain without contrast to rule out possible acute stroke 2. EEG to assess severity of encephalopathy as well as rule out possible new onset seizure disorder 3. No anticonvulsant medication was patient has a witnessed seizure, or EEG indicates increase likelihood of seizure activity in the future 4. Management of respiratory status and hypertension per CCM team  We will continue to follow this patient with you.    C.R. Roseanne Reno, MD [facility]  10/02/2016, 10:25 PM

## 2016-10-10 NOTE — Sedation Documentation (Signed)
Difficulty starting airway. Pt being bagged again

## 2016-10-10 NOTE — Progress Notes (Signed)
ABG collected  

## 2016-10-11 ENCOUNTER — Inpatient Hospital Stay (HOSPITAL_COMMUNITY): Payer: Medicare Other

## 2016-10-11 ENCOUNTER — Encounter (HOSPITAL_COMMUNITY): Payer: Self-pay | Admitting: Emergency Medicine

## 2016-10-11 DIAGNOSIS — R748 Abnormal levels of other serum enzymes: Secondary | ICD-10-CM

## 2016-10-11 DIAGNOSIS — R402431 Glasgow coma scale score 3-8, in the field [EMT or ambulance]: Secondary | ICD-10-CM

## 2016-10-11 DIAGNOSIS — I359 Nonrheumatic aortic valve disorder, unspecified: Secondary | ICD-10-CM

## 2016-10-11 DIAGNOSIS — G9340 Encephalopathy, unspecified: Secondary | ICD-10-CM

## 2016-10-11 DIAGNOSIS — J988 Other specified respiratory disorders: Secondary | ICD-10-CM

## 2016-10-11 LAB — BASIC METABOLIC PANEL
ANION GAP: 10 (ref 5–15)
Anion gap: 14 (ref 5–15)
BUN: 20 mg/dL (ref 6–20)
BUN: 24 mg/dL — ABNORMAL HIGH (ref 6–20)
CALCIUM: 9.6 mg/dL (ref 8.9–10.3)
CHLORIDE: 113 mmol/L — AB (ref 101–111)
CO2: 15 mmol/L — AB (ref 22–32)
CO2: 16 mmol/L — ABNORMAL LOW (ref 22–32)
CREATININE: 1.46 mg/dL — AB (ref 0.44–1.00)
Calcium: 8.9 mg/dL (ref 8.9–10.3)
Chloride: 111 mmol/L (ref 101–111)
Creatinine, Ser: 1.92 mg/dL — ABNORMAL HIGH (ref 0.44–1.00)
GFR calc Af Amer: 37 mL/min — ABNORMAL LOW (ref >60)
GFR calc non Af Amer: 32 mL/min — ABNORMAL LOW (ref >60)
GFR, EST AFRICAN AMERICAN: 26 mL/min — AB (ref >60)
GFR, EST NON AFRICAN AMERICAN: 23 mL/min — AB (ref >60)
GLUCOSE: 189 mg/dL — AB (ref 65–99)
Glucose, Bld: 120 mg/dL — ABNORMAL HIGH (ref 65–99)
POTASSIUM: 5.3 mmol/L — AB (ref 3.5–5.1)
Potassium: 4.6 mmol/L (ref 3.5–5.1)
SODIUM: 139 mmol/L (ref 135–145)
Sodium: 140 mmol/L (ref 135–145)

## 2016-10-11 LAB — GLUCOSE, CAPILLARY
GLUCOSE-CAPILLARY: 128 mg/dL — AB (ref 65–99)
Glucose-Capillary: 143 mg/dL — ABNORMAL HIGH (ref 65–99)
Glucose-Capillary: 187 mg/dL — ABNORMAL HIGH (ref 65–99)
Glucose-Capillary: 91 mg/dL (ref 65–99)
Glucose-Capillary: 92 mg/dL (ref 65–99)

## 2016-10-11 LAB — TRIGLYCERIDES: Triglycerides: 158 mg/dL — ABNORMAL HIGH (ref <150)

## 2016-10-11 LAB — URINALYSIS, ROUTINE W REFLEX MICROSCOPIC
Bilirubin Urine: NEGATIVE
GLUCOSE, UA: 50 mg/dL — AB
Ketones, ur: NEGATIVE mg/dL
Nitrite: NEGATIVE
PH: 5 (ref 5.0–8.0)
PROTEIN: 100 mg/dL — AB
Specific Gravity, Urine: 1.018 (ref 1.005–1.030)

## 2016-10-11 LAB — RESPIRATORY PANEL BY PCR
Adenovirus: NOT DETECTED
Bordetella pertussis: NOT DETECTED
CORONAVIRUS 229E-RVPPCR: NOT DETECTED
CORONAVIRUS OC43-RVPPCR: NOT DETECTED
Chlamydophila pneumoniae: NOT DETECTED
Coronavirus HKU1: NOT DETECTED
Coronavirus NL63: NOT DETECTED
INFLUENZA B-RVPPCR: NOT DETECTED
Influenza A: NOT DETECTED
METAPNEUMOVIRUS-RVPPCR: NOT DETECTED
Mycoplasma pneumoniae: NOT DETECTED
PARAINFLUENZA VIRUS 1-RVPPCR: NOT DETECTED
PARAINFLUENZA VIRUS 2-RVPPCR: NOT DETECTED
PARAINFLUENZA VIRUS 3-RVPPCR: NOT DETECTED
Parainfluenza Virus 4: NOT DETECTED
RESPIRATORY SYNCYTIAL VIRUS-RVPPCR: NOT DETECTED
Rhinovirus / Enterovirus: NOT DETECTED

## 2016-10-11 LAB — MRSA PCR SCREENING: MRSA BY PCR: POSITIVE — AB

## 2016-10-11 LAB — TROPONIN I
TROPONIN I: 45.06 ng/mL — AB (ref <0.03)
Troponin I: 3.57 ng/mL (ref <0.03)
Troponin I: 43.17 ng/mL (ref <0.03)

## 2016-10-11 LAB — VITAMIN B12: Vitamin B-12: 1135 pg/mL — ABNORMAL HIGH (ref 180–914)

## 2016-10-11 LAB — PROCALCITONIN: PROCALCITONIN: 18.59 ng/mL

## 2016-10-11 LAB — T4, FREE: Free T4: 1.51 ng/dL — ABNORMAL HIGH (ref 0.61–1.12)

## 2016-10-11 LAB — MAGNESIUM: MAGNESIUM: 2 mg/dL (ref 1.7–2.4)

## 2016-10-11 LAB — STREP PNEUMONIAE URINARY ANTIGEN: Strep Pneumo Urinary Antigen: POSITIVE — AB

## 2016-10-11 LAB — AMMONIA: Ammonia: 39 umol/L — ABNORMAL HIGH (ref 9–35)

## 2016-10-11 LAB — RAPID URINE DRUG SCREEN, HOSP PERFORMED
Amphetamines: NOT DETECTED
BARBITURATES: NOT DETECTED
Benzodiazepines: NOT DETECTED
COCAINE: NOT DETECTED
OPIATES: NOT DETECTED
Tetrahydrocannabinol: NOT DETECTED

## 2016-10-11 LAB — ECHOCARDIOGRAM COMPLETE
Height: 62 in
Weight: 1918.88 oz

## 2016-10-11 LAB — PHOSPHORUS: Phosphorus: 3.1 mg/dL (ref 2.5–4.6)

## 2016-10-11 LAB — LACTIC ACID, PLASMA
LACTIC ACID, VENOUS: 4.8 mmol/L — AB (ref 0.5–1.9)
LACTIC ACID, VENOUS: 4.1 mmol/L — AB (ref 0.5–1.9)
Lactic Acid, Venous: 5.4 mmol/L (ref 0.5–1.9)

## 2016-10-11 LAB — TSH: TSH: 1.414 u[IU]/mL (ref 0.350–4.500)

## 2016-10-11 MED ORDER — DEXTROSE 5 % IV SOLN
500.0000 mg | INTRAVENOUS | Status: DC
  Administered 2016-10-11 – 2016-10-12 (×2): 500 mg via INTRAVENOUS
  Filled 2016-10-11 (×3): qty 0.5

## 2016-10-11 MED ORDER — VANCOMYCIN HCL IN DEXTROSE 1-5 GM/200ML-% IV SOLN
1000.0000 mg | Freq: Once | INTRAVENOUS | Status: AC
  Administered 2016-10-11: 1000 mg via INTRAVENOUS
  Filled 2016-10-11: qty 200

## 2016-10-11 MED ORDER — PANTOPRAZOLE SODIUM 40 MG PO PACK
40.0000 mg | PACK | ORAL | Status: DC
  Administered 2016-10-12: 40 mg
  Filled 2016-10-11: qty 20

## 2016-10-11 MED ORDER — INSULIN ASPART 100 UNIT/ML ~~LOC~~ SOLN: 0.0000 [IU] | SUBCUTANEOUS | Status: DC

## 2016-10-11 MED ORDER — MUPIROCIN 2 % EX OINT
1.0000 "application " | TOPICAL_OINTMENT | Freq: Two times a day (BID) | CUTANEOUS | Status: DC
  Administered 2016-10-11 – 2016-10-12 (×3): 1 via NASAL
  Filled 2016-10-11 (×2): qty 22

## 2016-10-11 MED ORDER — ASPIRIN 325 MG PO TABS
325.0000 mg | ORAL_TABLET | Freq: Every day | ORAL | Status: DC
  Administered 2016-10-11: 325 mg via ORAL
  Filled 2016-10-11: qty 1

## 2016-10-11 MED ORDER — SUCCINYLCHOLINE CHLORIDE 20 MG/ML IJ SOLN
INTRAMUSCULAR | Status: DC | PRN
  Administered 2016-10-10: 80 mg via INTRAVENOUS

## 2016-10-11 MED ORDER — INSULIN ASPART 100 UNIT/ML ~~LOC~~ SOLN
0.0000 [IU] | SUBCUTANEOUS | Status: DC
  Administered 2016-10-11: 1 [IU] via SUBCUTANEOUS
  Administered 2016-10-11: 2 [IU] via SUBCUTANEOUS

## 2016-10-11 MED ORDER — ASPIRIN 325 MG PO TABS
325.0000 mg | ORAL_TABLET | Freq: Every day | ORAL | Status: DC
  Administered 2016-10-12: 325 mg
  Filled 2016-10-11: qty 1

## 2016-10-11 MED ORDER — PERFLUTREN LIPID MICROSPHERE
INTRAVENOUS | Status: AC
  Administered 2016-10-11: 2 mL
  Filled 2016-10-11: qty 10

## 2016-10-11 MED ORDER — SODIUM CHLORIDE 0.9 % IV BOLUS (SEPSIS)
500.0000 mL | Freq: Once | INTRAVENOUS | Status: AC
  Administered 2016-10-11: 500 mL via INTRAVENOUS

## 2016-10-11 MED ORDER — ETOMIDATE 2 MG/ML IV SOLN
INTRAVENOUS | Status: DC | PRN
  Administered 2016-10-10: 15 mg via INTRAVENOUS

## 2016-10-11 MED ORDER — VANCOMYCIN HCL 500 MG IV SOLR
500.0000 mg | INTRAVENOUS | Status: DC
  Administered 2016-10-12: 500 mg via INTRAVENOUS
  Filled 2016-10-11 (×2): qty 500

## 2016-10-11 MED ORDER — SODIUM CHLORIDE 0.9 % IV SOLN
250.0000 mg | Freq: Two times a day (BID) | INTRAVENOUS | Status: DC
  Administered 2016-10-11 – 2016-10-12 (×3): 250 mg via INTRAVENOUS
  Filled 2016-10-11 (×4): qty 2.5

## 2016-10-11 MED ORDER — MUPIROCIN CALCIUM 2 % EX CREA
TOPICAL_CREAM | Freq: Every day | CUTANEOUS | Status: DC
  Administered 2016-10-11 – 2016-10-12 (×2): via TOPICAL
  Filled 2016-10-11: qty 15

## 2016-10-11 MED ORDER — CHLORHEXIDINE GLUCONATE CLOTH 2 % EX PADS
6.0000 | MEDICATED_PAD | Freq: Every day | CUTANEOUS | Status: DC
  Administered 2016-10-12: 6 via TOPICAL

## 2016-10-11 MED ORDER — CHLORHEXIDINE GLUCONATE 0.12% ORAL RINSE (MEDLINE KIT)
15.0000 mL | Freq: Two times a day (BID) | OROMUCOSAL | Status: DC
  Administered 2016-10-11 – 2016-10-12 (×3): 15 mL via OROMUCOSAL

## 2016-10-11 MED ORDER — ORAL CARE MOUTH RINSE
15.0000 mL | OROMUCOSAL | Status: DC
  Administered 2016-10-11 – 2016-10-12 (×12): 15 mL via OROMUCOSAL

## 2016-10-11 NOTE — ED Notes (Signed)
Family made aware of bed assignment 

## 2016-10-11 NOTE — Code Documentation (Signed)
Code stroke called at 2121 on patient arriving in ED.  LKW @ 2045.  Pt intubated for airway protection. CT scan negative for stroke.  Code stroke cancelled @ 2214 by Dr. Roseanne RenoStewart.  CCM to admit pt to ICU.

## 2016-10-11 NOTE — Progress Notes (Signed)
CRITICAL VALUE ALERT  Critical value received:  Lactic Acid 4.8  Date of notification:  09/30/16  Time of notification:  1003  Critical value read back:Yes.    Nurse who received alert:  Francoise SchaumannSarah Roxanne Orner  MD notified (1st page):  Devra DoppSteve Minor, NP   Time of first page:  1003 (at nurses station)   MD notified (2nd page):  Time of second page:  Responding MD:  Brett CanalesSteve Minor NP   Time MD responded:  1003

## 2016-10-11 NOTE — Sedation Documentation (Signed)
Pt intubated per MD. 20 at the Lip. Positive color change noted and bilateral breath sounds heard. 7.0 tube inserted.

## 2016-10-11 NOTE — ED Provider Notes (Signed)
[facility] DEPT Provider Note   CSN: [id number] Arrival date & time: 10/12/2016  2134   An emergency department physician performed an initial assessment on this suspected stroke patient at 2150 (after intubation).  History   Chief Complaint Chief Complaint  Patient presents with  . Code Stroke    HPI Jasmine Fernandez is a 83 y.o. female.   Neurologic Problem  This is a new problem. The current episode started 1 to 2 hours ago. The problem occurs constantly. The problem has not changed since onset.Nothing aggravates the symptoms. Nothing relieves the symptoms. She has tried nothing for the symptoms.    Past Medical History:  Diagnosis Date  . Aortic stenosis    a. Echo 01/2016: severe AS, Valve area (VTI): 0.51 cm^2.   . Diabetes mellitus without complication ([place])    patient states she is not diabetic.   . Goiter   . Hypertension   . Rheumatic fever     Patient Active Problem List   Diagnosis Date Noted  . Acute encephalopathy 10/04/2016  . Thyroid mass 08/31/2016  . Moderate mitral valve stenosis 08/31/2016  . CHF (congestive heart failure) ([place]) 08/30/2016  . Acute on chronic diastolic CHF (congestive heart failure) ([place]) 08/30/2016  . Acute diastolic CHF (congestive heart failure) ([place]) 08/30/2016  . Non-healing ulcer ([place])   . Diabetic foot ulcer ([place]) 01/15/2016  . Diabetes mellitus ([place]) 01/15/2016  . Hypokalemia 01/15/2016  . Cellulitis 01/15/2016  . Foot ulcer ([place]) 01/15/2016  . Diabetic foot ulcer associated with type 2 diabetes mellitus ([place]) 01/15/2016  . AKI (acute kidney injury) ([place]) 07/01/2015  . Leukocytosis 07/01/2015  . Malnutrition of moderate degree 06/30/2015  . Weakness 06/29/2015  . Pressure ulcer 06/29/2015  . Fall 06/29/2015  . Hypertension   . Severe aortic valve stenosis   . Diabetes mellitus without complication [facility])     Past Surgical History:  Procedure Laterality Date  . PERIPHERAL VASCULAR CATHETERIZATION N/A 08/19/2015    Procedure: Lower Extremity Angiography;  Surgeon: Serafina Mitchell, MD;  Location: [facility];  Service: Cardiovascular;  Laterality: N/A;  . TONSILLECTOMY     During Childhood    OB History    No data available       Home Medications    Prior to Admission medications   Medication Sig Start Date End Date Taking? Authorizing Provider  atenolol (TENORMIN) 50 MG tablet Take 1 tablet (50 mg total) by mouth daily. 01/24/16  Yes Forde Dandy, MD  potassium chloride (K-DUR) 10 MEQ tablet Take 2 tablets (20 mEq total) by mouth daily. 01/24/16  Yes Forde Dandy, MD  SANTYL ointment Apply 1 application topically 2 (two) times daily. For wound care. 12/25/15  Yes Historical Provider, MD    Family History Family History  Problem Relation Age of Onset  . Heart disease Father   . Heart attack Father     Social History Social History  Substance Use Topics  . Smoking status: Never Smoker  . Smokeless tobacco: Never Used  . Alcohol use No     Allergies   Penicillins; Penicillin g; Lactose intolerance (gi); and Lac bovis   Review of Systems Review of Systems  Unable to perform ROS: Patient unresponsive     Physical Exam Updated Vital Signs BP (!) 146/83   Pulse 84   Temp 98.5 F (36.9 C) (Axillary)   Resp (!) 29   Ht 5' 2"  (1.575 m)   Wt 119 lb 14.9 oz (54.4  kg)   SpO2 100%   BMI 21.94 kg/m   Physical Exam  Constitutional: She appears well-developed and well-nourished. She appears distressed.  HENT:  Head: Normocephalic and atraumatic.  Eyes: Conjunctivae are normal. Pupils are equal, round, and reactive to light.  Neck: Neck supple. No thyromegaly present.  Cardiovascular: Tachycardia present.   Pulmonary/Chest: Effort normal.  Abdominal: Soft. She exhibits no distension.  Musculoskeletal: She exhibits no edema or deformity.  Neurological: She is unresponsive. GCS eye subscore is 1. GCS verbal subscore is 1. GCS motor subscore is 1.  Skin: Skin is warm and  dry.  Nursing note and vitals reviewed.    ED Treatments / Results  Labs (all labs ordered are listed, but only abnormal results are displayed) Labs Reviewed  MRSA PCR SCREENING - Abnormal; Notable for the following:       Result Value   MRSA by PCR POSITIVE (*)    All other components within normal limits  CBC - Abnormal; Notable for the following:    WBC 12.5 (*)    RBC 5.55 (*)    Hemoglobin 15.5 (*)    HCT 47.9 (*)    Platelets 125 (*)    All other components within normal limits  DIFFERENTIAL - Abnormal; Notable for the following:    Neutro Abs 8.2 (*)    All other components within normal limits  COMPREHENSIVE METABOLIC PANEL - Abnormal; Notable for the following:    CO2 18 (*)    Glucose, Bld 171 (*)    Creatinine, Ser 1.37 (*)    Total Bilirubin 1.3 (*)    GFR calc non Af Amer 34 (*)    GFR calc Af Amer 40 (*)    All other components within normal limits  URINALYSIS, ROUTINE W REFLEX MICROSCOPIC - Abnormal; Notable for the following:    Color, Urine AMBER (*)    APPearance TURBID (*)    Glucose, UA 50 (*)    Hgb urine dipstick MODERATE (*)    Protein, ur 100 (*)    Leukocytes, UA SMALL (*)    Bacteria, UA MANY (*)    Squamous Epithelial / LPF 0-5 (*)    Non Squamous Epithelial 0-5 (*)    All other components within normal limits  BASIC METABOLIC PANEL - Abnormal; Notable for the following:    CO2 15 (*)    Glucose, Bld 189 (*)    Creatinine, Ser 1.46 (*)    GFR calc non Af Amer 32 (*)    GFR calc Af Amer 37 (*)    All other components within normal limits  TRIGLYCERIDES - Abnormal; Notable for the following:    Triglycerides 158 (*)    All other components within normal limits  TROPONIN I - Abnormal; Notable for the following:    Troponin I 3.57 (*)    All other components within normal limits  LACTIC ACID, PLASMA - Abnormal; Notable for the following:    Lactic Acid, Venous 5.4 (*)    All other components within normal limits  LACTIC ACID, PLASMA -  Abnormal; Notable for the following:    Lactic Acid, Venous 4.8 (*)    All other components within normal limits  GLUCOSE, CAPILLARY - Abnormal; Notable for the following:    Glucose-Capillary 128 (*)    All other components within normal limits  TROPONIN I - Abnormal; Notable for the following:    Troponin I 43.17 (*)    All other components within normal limits  LACTIC  ACID, PLASMA - Abnormal; Notable for the following:    Lactic Acid, Venous 4.1 (*)    All other components within normal limits  STREP PNEUMONIAE URINARY ANTIGEN - Abnormal; Notable for the following:    Strep Pneumo Urinary Antigen POSITIVE (*)    All other components within normal limits  GLUCOSE, CAPILLARY - Abnormal; Notable for the following:    Glucose-Capillary 187 (*)    All other components within normal limits  I-STAT CHEM 8, ED - Abnormal; Notable for the following:    BUN 23 (*)    Creatinine, Ser 1.20 (*)    Glucose, Bld 175 (*)    Hemoglobin 16.7 (*)    HCT 49.0 (*)    All other components within normal limits  I-STAT ARTERIAL BLOOD GAS, ED - Abnormal; Notable for the following:    pH, Arterial 7.215 (*)    pO2, Arterial 429.0 (*)    Bicarbonate 15.8 (*)    Acid-base deficit 11.0 (*)    All other components within normal limits  RESPIRATORY PANEL BY PCR  CULTURE, BLOOD (ROUTINE X 2)  CULTURE, BLOOD (ROUTINE X 2)  CULTURE, RESPIRATORY (NON-EXPECTORATED)  ETHANOL  PROTIME-INR  APTT  RAPID URINE DRUG SCREEN, HOSP PERFORMED  MAGNESIUM  PHOSPHORUS  BLOOD GAS, ARTERIAL  HEMOGLOBIN A1C  TROPONIN I  HEMOGLOBIN H0W  BASIC METABOLIC PANEL  TROPONIN I  PROCALCITONIN  LEGIONELLA PNEUMOPHILA SEROGP 1 UR AG  I-STAT TROPOININ, ED    EKG  EKG Interpretation  Date/Time:  Monday October 10 2016 21:42:23 EST Ventricular Rate:  140 PR Interval:    QRS Duration: 91 QT Interval:  273 QTC Calculation: 417 R Axis:   -18 Text Interpretation:  Sinus tachycardia with irregular rate LVH with secondary  repolarization abnormality Anterior infarct, acute (LAD) Confirmed by Brittnei Jagiello MD, Elijha Dedman [id number]) on 10/06/2016 10:16:41 PM       Radiology Dg Chest Port 1 View  Result Date: 10/11/2016 CLINICAL DATA:  Initial evaluation for respiratory failure, intubation. EXAM: PORTABLE CHEST 1 VIEW COMPARISON:  Prior radiograph from 10/06/2016. FINDINGS: Patient remains intubated with the tip of an endotracheal tube positioned 3.1 cm above the carina. Enteric tube courses in the abdomen. Stable cardiomegaly. Mediastinal silhouette within normal limits. Aortic atherosclerosis noted. Lungs normally inflated. Small bilateral pleural effusions, left greater than right. Hazy bibasilar opacities favored to reflect atelectasis/ effusion. Mild perihilar vascular congestion without pulmonary edema. No other focal infiltrates. No pneumothorax. No acute osseus abnormality.  Osteopenia noted. IMPRESSION: 1. Tip of the endotracheal tube 3.1 cm above the carina. 2. Stable cardiomegaly with mild diffuse pulmonary vascular congestion and interstitial edema. 3. Interval development of small bilateral pleural effusions, left greater than right. Bibasilar opacities favored to reflect effusions/edema and/or atelectasis. Electronically Signed   By: Jeannine Boga M.D.   On: 10/11/2016 04:53   Dg Chest Portable 1 View  Result Date: 09/28/2016 CLINICAL DATA:  Endotracheal tube placement. EXAM: PORTABLE CHEST 1 VIEW COMPARISON:  08/29/2016 FINDINGS: Endotracheal tube tip is 3.2 cm above the base of the carina. The cardio pericardial silhouette is enlarged. Pulmonary vascular congestion noted with potential interstitial pulmonary edema. The visualized bony structures of the thorax are intact. Telemetry leads overlie the chest. IMPRESSION: 1. Endotracheal tube tip 3.2 cm above the carina. 2. Cardiomegaly with vascular congestion and probable interstitial edema. Electronically Signed   By: Misty Stanley M.D.   On: 10/05/2016 22:34   Dg Abd  Portable 1v  Result Date: 10/11/2016 CLINICAL DATA:  83 year old female with  gastric tube placement. Initial encounter. EXAM: PORTABLE ABDOMEN - 1 VIEW COMPARISON:  02/08/2008. FINDINGS: Gastric tube tip gastric fundus level. Nonspecific bowel gas pattern without plain film evidence of bowel obstruction. Cardiomegaly. Scoliosis and degenerative changes lumbar spine. Coarse calcification pelvic inlet may represent calcified fibroid. Vascular calcifications. IMPRESSION: Gastric tube tip gastric fundus level. Electronically Signed   By: Genia Del M.D.   On: 10/11/2016 10:35   Ct Head Code Stroke W/o Cm  Result Date: 09/22/2016 CLINICAL DATA:  Code stroke. Initial evaluation for acute unresponsiveness. EXAM: CT HEAD WITHOUT CONTRAST TECHNIQUE: Contiguous axial images were obtained from the base of the skull through the vertex without intravenous contrast. COMPARISON:  Prior CT from 06/23/2015. FINDINGS: Brain: Diffuse prominence of the CSF containing spaces compatible with generalized age-related cerebral atrophy. Chronic microvascular ischemic changes present within the periventricular and deep white matter. Confluent hypodensity within the anteromedial right frontal lobe compatible with right ACA territory infarct subacute to chronic in appearance. Additional focus of encephalomalacia within the left occipital lobe compatible with remote left PCA territory infarct. Small remote left cerebellar infarct noted as well. No other evidence for acute RD vessel territory infarct. No evidence for acute intracranial hemorrhage. No mass lesion, midline shift, or mass effect. Ventricular prominence related to global parenchymal volume loss without hydrocephalus. No extra-axial fluid collection. Vascular: Vascular calcifications present within the carotid siphons and distal vertebral arteries. No hyperdense vessel. Skull: Scalp soft tissues within normal limits.  Calvarium intact. Sinuses/Orbits: Globes and orbital soft  tissues within normal limits. Visualized paranasal sinuses and mastoids are clear. ASPECTS ([place] Stroke Program Early CT Score) - Ganglionic level infarction (caudate, lentiform nuclei, internal capsule, insula, M1-M3 cortex): 7 - Supraganglionic infarction (M4-M6 cortex): 3 Total score (0-10 with 10 being normal): 10 IMPRESSION: 1. Age-indeterminate right ACA territory infarct, favored to largely be subacute to chronic in appearance, although an acute component not excluded. This could be further assessed with dedicated MRI as clinically desired. 2. ASPECTS is 10 3. Additional remote left PCA and left cerebellar infarcts. 4. Moderate atrophy with chronic microvascular ischemic disease. Critical Value/emergent results were called by telephone at the time of interpretation on 09/29/2016 at 10:31 pm to Dr. Nicole Kindred, who verbally acknowledged these results. Electronically Signed   By: Jeannine Boga M.D.   On: 09/26/2016 22:35    Procedures  CRITICAL CARE Performed by: Merrily Pew Total critical care time: 45 minutes Critical care time was exclusive of separately billable procedures and treating other patients. Critical care was necessary to treat or prevent imminent or life-threatening deterioration. Critical care was time spent personally by me on the following activities: development of treatment plan with patient and/or surrogate as well as nursing, discussions with consultants, evaluation of patient's response to treatment, examination of patient, obtaining history from patient or surrogate, ordering and performing treatments and interventions, ordering and review of laboratory studies, ordering and review of radiographic studies, pulse oximetry and re-evaluation of patient's condition.   Procedure Name: Intubation Date/Time: 10/11/2016 1:22 PM Performed by: Merrily Pew Pre-anesthesia Checklist: Patient identified and Patient being monitored Oxygen Delivery Method: Ambu  bag Preoxygenation: Pre-oxygenation with 100% oxygen Intubation Type: IV induction and Rapid sequence Ventilation: Mask ventilation without difficulty and Nasal airway inserted- appropriate to patient size Laryngoscope Size: Glidescope and 3 Grade View: Grade I Tube size: 7.0 mm Number of attempts: 2 Airway Equipment and Method: Rigid stylet,  Stylet and Bougie stylet Placement Confirmation: ETT inserted through vocal cords under direct vision,  Breath sounds checked-  equal and bilateral and Positive ETCO2 Secured at: 24 cm Tube secured with: ETT holder Difficulty Due To: Difficulty was unanticipated Future Recommendations: Recommend- induction with short-acting agent, and alternative techniques readily available Comments: Difficulty obtaining view with miller laryngoscope blade. Easy to intubate with lidescope.       (including critical care time)  Medications Ordered in ED Medications  heparin injection 5,000 Units (not administered)  0.9 %  sodium chloride infusion ( Intravenous Rate/Dose Change 10/11/16 0909)  fentaNYL (SUBLIMAZE) injection 50 mcg (not administered)  propofol (DIPRIVAN) 1000 MG/100ML infusion (20 mcg/kg/min  59.3 kg Intravenous New Bag/Given 10/11/16 1100)  aspirin tablet 325 mg (not administered)  pantoprazole sodium (PROTONIX) 40 mg/20 mL oral suspension 40 mg (0 mg Per Tube Hold 10/11/16 1000)  insulin aspart (novoLOG) injection 0-9 Units (2 Units Subcutaneous Given 10/11/16 1235)  mupirocin cream (BACTROBAN) 2 % ( Topical Given 10/11/16 1054)  vancomycin (VANCOCIN) 500 mg in sodium chloride 0.9 % 100 mL IVPB (not administered)  ceFEPIme (MAXIPIME) 500 mg in dextrose 5 % 50 mL IVPB (500 mg Intravenous Given 10/11/16 1228)  chlorhexidine gluconate (MEDLINE KIT) (PERIDEX) 0.12 % solution 15 mL (not administered)  MEDLINE mouth rinse (not administered)  sodium chloride 0.9 % bolus 1,000 mL (0 mLs Intravenous Stopped 10/19/2016 2333)  sodium chloride 0.9 % bolus 500 mL  (500 mLs Intravenous Given 10/11/16 0915)  vancomycin (VANCOCIN) IVPB 1000 mg/200 mL premix (1,000 mg Intravenous Given 10/11/16 1131)  PERFLUTREN LIPID MICROSPHERE injection SUSP (2 mLs  Given 10/11/16 1148)     Initial Impression / Assessment and Plan / ED Course  I have reviewed the triage vital signs and the nursing notes.  Pertinent labs & imaging results that were available during my care of the patient were reviewed by me and considered in my medical decision making (see chart for details).     Here as a code stroke for acute unresponsiveness about an hour prior to arrival, possibly right eye deviation at some point in transport, but not on initial exam here. Unresponsive on arrival, intubated for airway protection. Suspected possible hemmorhagic stroke 2/2 unresponosiveness, tachycardia, hypertension, however CT not suggestive of same. Possibly seizure so neuro recommending EEG. otherwise will admit to icu for furhter workup.   Final Clinical Impressions(s) / ED Diagnoses   Final diagnoses:  Respiratory failure ([place])  Unresponsive    New Prescriptions Current Discharge Medication List       Merrily Pew, MD 10/11/16 1329

## 2016-10-11 NOTE — Progress Notes (Signed)
Missed family for 87-y-o pt in waiting rm, will check back later.   10/11/16 1200  Clinical Encounter Type  Visited With Patient not available;Health care provider  Visit Type Initial;Critical Care  Referral From Nurse   Ephraim Hamburgerynthia A Nguyet Mercer, Chaplain

## 2016-10-11 NOTE — Progress Notes (Signed)
Pharmacy Antibiotic Note  Jyl Heinzauline D Depp is a 83 y.o. female admitted on 25-Jan-2017 with sepsis.  Pharmacy has been consulted for cefepime and vancomycin dosing. Documented penicillin allergy on file, but Dr. Jamison NeighborNestor is okay with giving cefepime. Patient is currently intubated in the ICU; WBC 12.5, Tmax 98.8  Plan: Vancomycin 1000mg  IV once then 500mg  IV every 24 hours.  Goal trough 15-20 mcg/mL. Cefepime 500mg  IV every 24 hours   Height: 5\' 2"  (157.5 cm) Weight: 119 lb 14.9 oz (54.4 kg) IBW/kg (Calculated) : 50.1  Temp (24hrs), Avg:98.2 F (36.8 C), Min:97.5 F (36.4 C), Max:98.8 F (37.1 C)   Recent Labs Lab 12-29-2016 2150 12-29-2016 2156 10/11/16 0023 10/11/16 0843  WBC 12.5*  --   --   --   CREATININE 1.37* 1.20* 1.46*  --   LATICACIDVEN  --   --  5.4* 4.8*    Estimated Creatinine Clearance: 22.3 mL/min (by C-G formula based on SCr of 1.46 mg/dL (H)).    Allergies  Allergen Reactions  . Penicillins Swelling    Swelling and itching Has patient had a PCN reaction causing immediate rash, facial/tongue/throat swelling, SOB or lightheadedness with hypotension: YES Has patient had a PCN reaction causing severe rash involving mucus membranes or skin necrosis:no Has patient had a PCN reaction that required hospitalization: no Has patient had a PCN reaction occurring within the last 10 years: No If all of the above answers are "NO", then may proceed with Cephalosporin use.   Marland Kitchen. Penicillin G Swelling  . Lactose Intolerance (Gi) Diarrhea  . Lac Bovis Diarrhea    Microbiology results: 1/23 MRSA PCR: positive  Thank you for allowing pharmacy to be a part of this patient's care.  Toniann Failony L Marline Morace 10/11/2016 11:19 AM

## 2016-10-11 NOTE — Progress Notes (Signed)
  Echocardiogram 2D Echocardiogram with Definity has been performed.  Jasmine Fernandez, Jasmine Fernandez 10/11/2016, 12:04 PM

## 2016-10-11 NOTE — Progress Notes (Signed)
eLink Physician-Brief Progress Note Patient Name: Jasmine Fernandez DOB: [birth year] MRN: [id number]   Date of Service  10/11/2016  HPI/Events of Note  Trop 3 EKG - LVH, some repol changes  eICU Interventions  Rpt EKG Trend trop ASA 325 now     Intervention Category Intermediate Interventions: Diagnostic test evaluation  Jasmine Fehrman V. 10/11/2016, 1:27 AM

## 2016-10-11 NOTE — Care Management Note (Signed)
Case Management Note  Patient Details  Name: Jasmine Fernandez MRN: [id number] Date of Birth: 1929-05-20  Subjective/Objective:   Pt admitted on January 06, 2017 with acute encephalopathy, possible seizure activity, and acute Rt ACA stroke.  PTA, pt resided at home with daughter.                   Action/Plan: Pt currently remains intubated.  Will follow for discharge planning as pt progresses.    Expected Discharge Date:                  Expected Discharge Plan:  Skilled Nursing Facility  In-House Referral:  Clinical Social Work  Discharge planning Services  CM Consult  Post Acute Care Choice:    Choice offered to:     DME Arranged:    DME Agency:     HH Arranged:    HH Agency:     Status of Service:  In process, will continue to follow  If discussed at Long Length of Stay Meetings, dates discussed:    Additional Comments:  Quintella BatonJulie W. Phyliss Hulick, RN, BSN  Trauma/Neuro ICU Case Manager [phone]

## 2016-10-11 NOTE — ED Notes (Addendum)
Per [facility]  Pt was sitting at home with her family member in the recliner when she stopped talking and became unresponsive. Her head fell back as if she fainted. Pt has no seizure history.  Family assisted to the floor and states she has snoring respirations. EMS states that the family member performed chest compressions but had a strong pulse.  Nasal trumpet in upon arrival to ER. Family states she takes ASA occasionally. Pt had pinpoint pupils and fmaily denies the use of narcotics. Pt had a positive gag reflex was with EMS. 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 Bilateral Rhonchi to lower lung fields.

## 2016-10-11 NOTE — Progress Notes (Signed)
Neurology Progress Note  Subjective: No significant overnight events. She remains unresponsive off sedation. She is unable to participate with ROS as she is comatose.   Current Meds:   Current Facility-Administered Medications:  .  0.9 %  sodium chloride infusion, , Intravenous, Continuous, Grace Bushy Minor, NP, Last Rate: 100 mL/hr at 10/11/16 0909 .  [START ON 10/12/2016] aspirin tablet 325 mg, 325 mg, Per Tube, Daily, Chesley Mires, MD .  ceFEPIme (MAXIPIME) 500 mg in dextrose 5 % 50 mL IVPB, 500 mg, Intravenous, Q24H, Javier Glazier, MD, 500 mg at 10/11/16 1228 .  chlorhexidine gluconate (MEDLINE KIT) (PERIDEX) 0.12 % solution 15 mL, 15 mL, Mouth Rinse, BID, Javier Glazier, MD, 15 mL at 10/11/16 1515 .  [START ON 10/12/2016] Chlorhexidine Gluconate Cloth 2 % PADS 6 each, 6 each, Topical, Q0600, Javier Glazier, MD .  fentaNYL (SUBLIMAZE) injection 50 mcg, 50 mcg, Intravenous, Q2H PRN, Rahul P Desai, PA-C .  heparin injection 5,000 Units, 5,000 Units, Subcutaneous, Q8H, Rahul P Desai, PA-C, 5,000 Units at 10/11/16 1408 .  insulin aspart (novoLOG) injection 0-9 Units, 0-9 Units, Subcutaneous, Q4H, William S Minor, NP, 1 Units at 10/11/16 1518 .  levETIRAcetam (KEPPRA) 250 mg in sodium chloride 0.9 % 100 mL IVPB, 250 mg, Intravenous, Q12H, Darrel Reach, MD .  MEDLINE mouth rinse, 15 mL, Mouth Rinse, 10 times per day, Javier Glazier, MD, 15 mL at 10/11/16 1408 .  mupirocin cream (BACTROBAN) 2 %, , Topical, Daily, Chesley Mires, MD .  mupirocin ointment (BACTROBAN) 2 % 1 application, 1 application, Nasal, BID, Javier Glazier, MD .  pantoprazole sodium (PROTONIX) 40 mg/20 mL oral suspension 40 mg, 40 mg, Per Tube, Q24H, Chesley Mires, MD, Stopped at 10/11/16 1000 .  propofol (DIPRIVAN) 1000 MG/100ML infusion, 0-50 mcg/kg/min, Intravenous, Continuous, Rahul P Desai, PA-C, Last Rate: 7.1 mL/hr at 10/11/16 1100, 20 mcg/kg/min at 10/11/16 1100 .  [START ON 10/12/2016] vancomycin (VANCOCIN)  500 mg in sodium chloride 0.9 % 100 mL IVPB, 500 mg, Intravenous, Q24H, Chesley Mires, MD  Objective:  Temp:  [97.5 F (36.4 C)-98.8 F (37.1 C)] 98.5 F (36.9 C) (01/23 1200) Pulse Rate:  [71-108] 80 (01/23 1500) Resp:  [16-39] 30 (01/23 1500) BP: (113-195)/(58-128) 140/68 (01/23 1500) SpO2:  [72 %-100 %] 100 % (01/23 1500) FiO2 (%):  [40 %-100 %] 40 % (01/23 1116) Weight:  [54.4 kg (119 lb 14.9 oz)-59.3 kg (130 lb 11.7 oz)] 54.4 kg (119 lb 14.9 oz) (01/23 0617)  General: WDWN elderly African-American woman lying in ICU bed. She is intubated. No sedation is running. She is unresponsive to verbal, tactile, and noxious stimulation. There is no eye opening, either spontaneously or with stimulation. She does not follow any commands.  HEENT: Neck is supple without lymphadenopathy. ETT in place. Sclerae are anicteric. There is no conjunctival injection.  CV: Regular, no murmur. Carotid pulses are 2+ and symmetric with no bruits. Distal pulses 2+ and symmetric.  Lungs: CTAB  Extremities: No C/C/E. Neuro: MS: As noted above. No aphasia.  CN: Pupils are equal and reactive from 3-->2 mm bilaterally. She does not blink to visual threat. Eyes are conjugate. No forced deviation and no nystagmus. Corneals are present. Oculocephalics are sluggish. She demonstrated no response to supraorbital pressure on either side. Her face appears grossly symmetric but is partly obscured by tubes and tape. The remainder of her cranial nerves cannot be accurately assessed as she is unable to participate with the examination.  Motor: Normal  bulk. Tone is reduced throughout. No spontaneous movements are observed. Sensation: She has no response to nailbed pressure in any of her extremities. There is no response to central pain.  DTRs: 2+, brisker on the left than the right. Toes are mute bilaterally. No pathological reflexes.  Coordination and gait: These cannot be assessed as the patient is unable to participate with the  examination.  Labs: Lab Results  Component Value Date   WBC 12.5 (H) 09/20/2016   HGB 16.7 (H) 09/29/2016   HCT 49.0 (H) 10/14/2016   PLT 125 (L) 10/12/2016   GLUCOSE 189 (H) 10/11/2016   TRIG 158 (H) 10/11/2016   ALT 14 10/09/2016   AST 26 10/09/2016   NA 140 10/11/2016   K 4.6 10/11/2016   CL 111 10/11/2016   CREATININE 1.46 (H) 10/11/2016   BUN 20 10/11/2016   CO2 15 (L) 10/11/2016   TSH 0.847 01/16/2016   INR 1.19 10/09/2016   HGBA1C 6.3 (H) 08/30/2016   CBC Latest Ref Rng & Units 09/19/2016 09/21/2016 08/30/2016  WBC 4.0 - 10.5 K/uL - 12.5(H) 12.5(H)  Hemoglobin 12.0 - 15.0 g/dL 16.7(H) 15.5(H) 12.9  Hematocrit 36.0 - 46.0 % 49.0(H) 47.9(H) 39.3  Platelets 150 - 400 K/uL - 125(L) 202    Lab Results  Component Value Date   HGBA1C 6.3 (H) 08/30/2016   Lab Results  Component Value Date   ALT 14 10/14/2016   AST 26 10/06/2016   ALKPHOS 69 09/26/2016   BILITOT 1.3 (H) 10/16/2016    Radiology:  I have personally and independently reviewed the [facility] without contrast from 09/23/2016. There are several areas of focal encephalomalacia involving the L occipital, R parietal, and R frontal lobes consistent with remote infarcts. The R frontal hypodensity is less well-defined and could represent subacute or chronic ischemia. There is ex-vacuo dilatation of the lateral ventricles with more prominent dilation of the R frontal horn. Moderate chronic small vessel ischemic disease is present. There is mild to moderate diffuse generalized atrophy.   Other diagnostic studies:  EEG shows moderate diffuse slowing with left hemispheric epileptiform discharges over the left frontocentral region that occur in a pseudo-periodic pattern. No seizures.   A/P:   1. Coma: The etiology for her sudden coma is unclear at this time. EEG does not show status epilepticus but does show lateralized epileptiform discharges on the L, most pronounced in the L frontocentral region. This would suggest regional  cortical irritability and can be seen with acute focal cerebral pathology. These discharges could suggest an increased likelihood for seizure, the numbness recorded on the EEG. A pontine infarct could produce this picture that she has no other findings on examination to suggest brainstem pathology. Diffuse cerebral dysfunction must also be considered and this could be due to metabolic disturbances, global ischemia, and seizure activity. Given the abrupt decline in her level of consciousness with absence of premonitory symptoms, CNS infection seems less likely.  This point, I will check TSH, free T4, vitamin B12, vitamin B1, and serum ammonia. MRI scan of the brain has not been ordered so this will be obtained now. If MRI does not reveal any structural pathology, may need to consider continuous EEG to exclude intermittent subclinical seizures or nonconvulsive status epilepticus. I will defer lumbar puncture for now given low clinical suspicion for CNS infection. I have initiated Keppra 250 mg twice daily given the abnormal is an EEG and concern for possible seizure.  This was discussed with the patient's daughter at the bedside.  She is in agreement the plan is noted. She was given the opportunity to ask any questions and these were addressed to her satisfaction.  Neurology will continue to follow. Please feel free to call with any questions or concerns.  This patient is critically ill and at significant risk of neurological worsening, death and care requires constant monitoring of vital signs, hemodynamics,respiratory and cardiac monitoring, neurological assessment, discussion with family, other specialists and medical decision making of high complexity. A total of 40 minutes of critical care time was spent on this case.   Melba Coon, MD [facility]

## 2016-10-11 NOTE — Consult Note (Signed)
WOC Nurse wound consult note [facility]; refer to consult note on 12/12 Reason for Consult: Consult requested for right heel wound.  Family at the bedside states this is chronic and has greatly improved.  They have been using Santyl prior to admission. Pressure Injury POA: Yes Measurement: 1X2X.3cm Wound bed: 95% red and moist, 5% yellow interspersed throughout. Drainage (amount, consistency, odor) Small amt yellow drainage, no odor Periwound: Intact skin surrounding Dressing procedure/placement/frequency: Discontinue use of Santyl, begin Bactroban to promote moist healing.  Float heels to reduce pressure.  Discussed plan of care with family member at the bedside and they verbalize understanding. Please re-consult if further assistance is needed.  Thank-you,  Cammie Mcgeeawn Andora Krull MSN, RN, CWOCN, [place], CNS [phone]

## 2016-10-11 NOTE — ED Notes (Signed)
Family made aware of pts bed status

## 2016-10-11 NOTE — Progress Notes (Signed)
PULMONARY / CRITICAL CARE MEDICINE   Name: Jasmine Fernandez MRN: [id number] DOB: 08/04/1929    ADMISSION DATE:  10/11/2016 CONSULTATION DATE:  10/11/16  REFERRING MD:  Mesner  CHIEF COMPLAINT:  AMS  HISTORY OF PRESENT ILLNESS:  Pt is encephelopathic; therefore, this HPI is obtained from chart review. Jasmine Fernandez is a 83 y.o. female with PMH as outlined below. [facility] as code stroke.  She was apparently at her home with daughter when she slumped over while sitting in chair and became unresponsive.  Daughter called EMS and upon their arrival, pt remained in this state.  In ED, she required intubation for airway protection.  She had CT of the head which did not show any acute infarct but demonstrate an age indeterminate right ACA infarct favored to be subacute to chronic.  There were also remote left PCA and left cerebellar infarcts.   SUBJECTIVE:  On vent, unresponsive.  VITAL SIGNS: BP (!) 151/76   Pulse 83   Temp 98.8 F (37.1 C) (Axillary)   Resp (!) 25   Ht 5\' 2"  (1.575 m)   Wt 119 lb 14.9 oz (54.4 kg)   SpO2 99%   BMI 21.94 kg/m   HEMODYNAMICS:    VENTILATOR SETTINGS: Vent Mode: PRVC FiO2 (%):  [40 %-100 %] 40 % Set Rate:  [18 bmp] 18 bmp Vt Set:  [400 mL] 400 mL PEEP:  [5 cmH20] 5 cmH20 Plateau Pressure:  [21 cmH20-28 cmH20] 21 cmH20  INTAKE / OUTPUT: I/O last 3 completed shifts: In: 1537.7 [I.V.:477.7; Other:60; IV Piggyback:1000] Out: 10 [Urine:10]   PHYSICAL EXAMINATION: General: Elderly female, in NAD. Neuro: Sedated, non-responsive. HEENT: [place]/AT. PERRL, sclerae anicteric. Arcus senilis. Cardiovascular: RRR, 2/6 SEM. Lungs: Respirations even and unlabored.  CTA bilaterally, No W/R/R. Abdomen: BS x 4, soft, NT/ND.  Musculoskeletal: No gross deformities, no edema.  Skin: Intact, warm, no rashes. RT foot dressing dry and intact  LABS:  BMET  Recent Labs Lab 10/01/2016 2150 09/25/2016 2156 10/11/16 0023  NA 139 141 140  K  4.8 4.5 4.6  CL 106 107 111  CO2 18*  --  15*  BUN 19 23* 20  CREATININE 1.37* 1.20* 1.46*  GLUCOSE 171* 175* 189*    Electrolytes  Recent Labs Lab 10/02/2016 0018 10/14/2016 2150 10/11/16 0023  CALCIUM  --  10.1 9.6  MG 2.0  --   --   PHOS 3.1  --   --     CBC  Recent Labs Lab 09/20/2016 2150 09/19/2016 2156  WBC 12.5*  --   HGB 15.5* 16.7*  HCT 47.9* 49.0*  PLT 125*  --     Coag's  Recent Labs Lab 10/18/2016 2150  APTT 27  INR 1.19    Sepsis Markers  Recent Labs Lab 10/11/16 0023  LATICACIDVEN 5.4*    ABG  Recent Labs Lab 09/30/2016 2311  PHART 7.215*  PCO2ART 39.0  PO2ART 429.0*    Liver Enzymes  Recent Labs Lab 10/07/2016 2150  AST 26  ALT 14  ALKPHOS 69  BILITOT 1.3*  ALBUMIN 3.6    Cardiac Enzymes  Recent Labs Lab 10/11/16 0023  TROPONINI 3.57*    Glucose No results for input(s): GLUCAP in the last 168 hours.  Imaging Dg Chest Port 1 View  Result Date: 10/11/2016 CLINICAL DATA:  Initial evaluation for respiratory failure, intubation. EXAM: PORTABLE CHEST 1 VIEW COMPARISON:  Prior radiograph from 10/01/2016. FINDINGS: Patient remains intubated with the tip of  an endotracheal tube positioned 3.1 cm above the carina. Enteric tube courses in the abdomen. Stable cardiomegaly. Mediastinal silhouette within normal limits. Aortic atherosclerosis noted. Lungs normally inflated. Small bilateral pleural effusions, left greater than right. Hazy bibasilar opacities favored to reflect atelectasis/ effusion. Mild perihilar vascular congestion without pulmonary edema. No other focal infiltrates. No pneumothorax. No acute osseus abnormality.  Osteopenia noted. IMPRESSION: 1. Tip of the endotracheal tube 3.1 cm above the carina. 2. Stable cardiomegaly with mild diffuse pulmonary vascular congestion and interstitial edema. 3. Interval development of small bilateral pleural effusions, left greater than right. Bibasilar opacities favored to reflect  effusions/edema and/or atelectasis. Electronically Signed   By: Rise Mu M.D.   On: 10/11/2016 04:53   Dg Chest Portable 1 View  Result Date: Nov 07, 2016 CLINICAL DATA:  Endotracheal tube placement. EXAM: PORTABLE CHEST 1 VIEW COMPARISON:  08/29/2016 FINDINGS: Endotracheal tube tip is 3.2 cm above the base of the carina. The cardio pericardial silhouette is enlarged. Pulmonary vascular congestion noted with potential interstitial pulmonary edema. The visualized bony structures of the thorax are intact. Telemetry leads overlie the chest. IMPRESSION: 1. Endotracheal tube tip 3.2 cm above the carina. 2. Cardiomegaly with vascular congestion and probable interstitial edema. Electronically Signed   By: Kennith Center M.D.   On: Nov 07, 2016 22:34   Ct Head Code Stroke W/o Cm  Result Date: Nov 07, 2016 CLINICAL DATA:  Code stroke. Initial evaluation for acute unresponsiveness. EXAM: CT HEAD WITHOUT CONTRAST TECHNIQUE: Contiguous axial images were obtained from the base of the skull through the vertex without intravenous contrast. COMPARISON:  Prior CT from 06/23/2015. FINDINGS: Brain: Diffuse prominence of the CSF containing spaces compatible with generalized age-related cerebral atrophy. Chronic microvascular ischemic changes present within the periventricular and deep white matter. Confluent hypodensity within the anteromedial right frontal lobe compatible with right ACA territory infarct subacute to chronic in appearance. Additional focus of encephalomalacia within the left occipital lobe compatible with remote left PCA territory infarct. Small remote left cerebellar infarct noted as well. No other evidence for acute RD vessel territory infarct. No evidence for acute intracranial hemorrhage. No mass lesion, midline shift, or mass effect. Ventricular prominence related to global parenchymal volume loss without hydrocephalus. No extra-axial fluid collection. Vascular: Vascular calcifications present within  the carotid siphons and distal vertebral arteries. No hyperdense vessel. Skull: Scalp soft tissues within normal limits.  Calvarium intact. Sinuses/Orbits: Globes and orbital soft tissues within normal limits. Visualized paranasal sinuses and mastoids are clear. ASPECTS (Alberta Stroke Program Early CT Score) - Ganglionic level infarction (caudate, lentiform nuclei, internal capsule, insula, M1-M3 cortex): 7 - Supraganglionic infarction (M4-M6 cortex): 3 Total score (0-10 with 10 being normal): 10 IMPRESSION: 1. Age-indeterminate right ACA territory infarct, favored to largely be subacute to chronic in appearance, although an acute component not excluded. This could be further assessed with dedicated MRI as clinically desired. 2. ASPECTS is 10 3. Additional remote left PCA and left cerebellar infarcts. 4. Moderate atrophy with chronic microvascular ischemic disease. Critical Value/emergent results were called by telephone at the time of interpretation on 11-07-16 at 10:31 pm to Dr. Roseanne Reno, who verbally acknowledged these results. Electronically Signed   By: Rise Mu M.D.   On: 11/07/16 22:35     STUDIES:  CT head 01/22 > age indeterminate right ACA infarct favored to be subacute to chronic, remote left PCA and left cerebellar infarcts.  CULTURES: None.  ANTIBIOTICS: None.  SIGNIFICANT EVENTS: 01/23 > admit.  LINES/TUBES: ETT 01/23 >  DISCUSSION: 82 y.o. female  admitted 01/23 with AMS, possible acute CVA (initial head CT neg).  Required intubation in ED for airway protection.  ASSESSMENT / PLAN:  NEUROLOGIC A:   Acute encephalopathy - possible acute infarct but initial head CT negative. P:   Sedation:  Propofol gtt / Fentanyl PRN. RASS goal: 0 to -1. Daily WUA. 1/23 re sedated for vomiting Neurology following. Assess MRI, EEG, UDS.  PULMONARY A: VDRF - due to inability to protect the airway in the setting of AMS. P:   Full vent support. Wean as able. VAP  prevention measures. SBT in AM if neuro status improves.and acidosis improves CXR in AM.  CARDIOVASCULAR A:  Hx HTN, Aortic stenosis. + trop P:  Monitor hemodynamics. Trend troponin, lactate. Hold preadmission atenolol.  RENAL Lab Results  Component Value Date   CREATININE 1.46 (H) 10/11/2016   CREATININE 1.20 (H) 09/28/2016   CREATININE 1.37 (H) 10/04/2016   Lactic acid 5.4  A:   AoCKD. Lactic acidosis P:   NS @ 75. Fluid bolus May need bicarb drip if lactic acid does not resolve BMP   GASTROINTESTINAL A:   GI prophylaxis. Nutrition. P:   SUP: Pantoprazole. NPO. SLP eval once extubated.  HEMATOLOGIC  Recent Labs  10/18/2016 2150 09/28/2016 2156  HGB 15.5* 16.7*     polycythemia  A:   VTE Prophylaxis. P:  SCD's / heparin. CBC   INFECTIOUS A:   No indication of infection. P:   Monitor clinically. Note right foot chronic ulcer  ENDOCRINE CBG (last 3)   A:   Hyperglycemia  P:   SSI   Family updated: Daughter updated at bedside. Wound care consult placed  Interdisciplinary Family Meeting v Palliative Care Meeting:  Due by: 10/18/16.  CC time: 35 minutes.   Brett Canales Minor ACNP [facility] PCCM Pager [phone] till 3 pm If no answer page [phone] 10/11/2016, 8:53 AM

## 2016-10-11 NOTE — H&P (Signed)
PULMONARY / CRITICAL CARE MEDICINE   Name: Jasmine Fernandez MRN: [id number] DOB: 30-Jun-1929    ADMISSION DATE:  09/21/2016 CONSULTATION DATE:  10/11/16  REFERRING MD:  Mesner  CHIEF COMPLAINT:  AMS  HISTORY OF PRESENT ILLNESS:  Pt is encephelopathic; therefore, this HPI is obtained from chart review. Jasmine Fernandez is a 83 y.o. female with PMH as outlined below. She was brought to [facility] ED 01/22 as code stroke.  She was apparently at her home with daughter when she slumped over while sitting in chair and became unresponsive.  Daughter called EMS and upon their arrival, pt remained in this state.  In ED, she required intubation for airway protection.  She had CT of the head which did not show any acute infarct but demonstrate an age indeterminate right ACA infarct favored to be subacute to chronic.  There were also remote left PCA and left cerebellar infarcts.   PAST MEDICAL HISTORY :  She  has a past medical history of Aortic stenosis; Diabetes mellitus without complication (HCC); Goiter; Hypertension; and Rheumatic fever.  PAST SURGICAL HISTORY: She  has a past surgical history that includes Tonsillectomy and Cardiac catheterization (N/A, 08/19/2015).  Allergies  Allergen Reactions  . Penicillins Swelling    Swelling and itching Has patient had a PCN reaction causing immediate rash, facial/tongue/throat swelling, SOB or lightheadedness with hypotension: YES Has patient had a PCN reaction causing severe rash involving mucus membranes or skin necrosis:no Has patient had a PCN reaction that required hospitalization: no Has patient had a PCN reaction occurring within the last 10 years: No If all of the above answers are "NO", then may proceed with Cephalosporin use.   Marland Kitchen Penicillin G Swelling  . Lac Bovis Diarrhea    No current facility-administered medications on file prior to encounter.    Current Outpatient Prescriptions on File Prior to Encounter  Medication Sig  . atenolol  (TENORMIN) 50 MG tablet Take 1 tablet (50 mg total) by mouth daily.  . potassium chloride (K-DUR) 10 MEQ tablet Take 2 tablets (20 mEq total) by mouth daily.  Marland Kitchen SANTYL ointment Apply 1 application topically 2 (two) times daily. For wound care.    FAMILY HISTORY:  Her indicated that her mother is deceased. She indicated that her father is deceased. She indicated that four of her five sisters are alive. She indicated that her daughter is alive.    SOCIAL HISTORY: She  reports that she has never smoked. She has never used smokeless tobacco. She reports that she does not drink alcohol or use drugs.  REVIEW OF SYSTEMS:  Unable to obtain as pt is encephalopathic.  SUBJECTIVE:  On vent, unresponsive.  VITAL SIGNS: BP 178/86   Pulse 92   Resp 23   Wt 59.3 kg (130 lb 11.7 oz)   SpO2 100%   BMI 26.40 kg/m   HEMODYNAMICS:    VENTILATOR SETTINGS: Vent Mode: PRVC FiO2 (%):  [100 %] 100 % Set Rate:  [18 bmp] 18 bmp Vt Set:  [400 mL] 400 mL PEEP:  [5 cmH20] 5 cmH20 Plateau Pressure:  [28 cmH20] 28 cmH20  INTAKE / OUTPUT: No intake/output data recorded.   PHYSICAL EXAMINATION: General: Elderly female, in NAD. Neuro: Sedated, non-responsive. HEENT: [place]/AT. PERRL, sclerae anicteric. Arcus senilis. Cardiovascular: RRR, 2/6 SEM. Lungs: Respirations even and unlabored.  CTA bilaterally, No W/R/R. Abdomen: BS x 4, soft, NT/ND.  Musculoskeletal: No gross deformities, no edema.  Skin: Intact, warm, no rashes.  LABS:  BMET  Recent  Labs Lab 10/19/2016 2150 10/02/2016 2156  NA 139 141  K 4.8 4.5  CL 106 107  CO2 18*  --   BUN 19 23*  CREATININE 1.37* 1.20*  GLUCOSE 171* 175*    Electrolytes  Recent Labs Lab 10/09/2016 0018 10/01/2016 2150  CALCIUM  --  10.1  MG 2.0  --   PHOS 3.1  --     CBC  Recent Labs Lab 09/23/2016 2150 09/21/2016 2156  WBC 12.5*  --   HGB 15.5* 16.7*  HCT 47.9* 49.0*  PLT 125*  --     Coag's  Recent Labs Lab 09/25/2016 2150  APTT 27  INR  1.19    Sepsis Markers No results for input(s): LATICACIDVEN, PROCALCITON, O2SATVEN in the last 168 hours.  ABG  Recent Labs Lab 10/07/2016 2311  PHART 7.215*  PCO2ART 39.0  PO2ART 429.0*    Liver Enzymes  Recent Labs Lab 09/25/2016 2150  AST 26  ALT 14  ALKPHOS 69  BILITOT 1.3*  ALBUMIN 3.6    Cardiac Enzymes No results for input(s): TROPONINI, PROBNP in the last 168 hours.  Glucose No results for input(s): GLUCAP in the last 168 hours.  Imaging Dg Chest Portable 1 View  Result Date: 10/17/2016 CLINICAL DATA:  Endotracheal tube placement. EXAM: PORTABLE CHEST 1 VIEW COMPARISON:  08/29/2016 FINDINGS: Endotracheal tube tip is 3.2 cm above the base of the carina. The cardio pericardial silhouette is enlarged. Pulmonary vascular congestion noted with potential interstitial pulmonary edema. The visualized bony structures of the thorax are intact. Telemetry leads overlie the chest. IMPRESSION: 1. Endotracheal tube tip 3.2 cm above the carina. 2. Cardiomegaly with vascular congestion and probable interstitial edema. Electronically Signed   By: Kennith CenterEric  Mansell M.D.   On: 09/23/2016 22:34   Ct Head Code Stroke W/o Cm  Result Date: 10/12/2016 CLINICAL DATA:  Code stroke. Initial evaluation for acute unresponsiveness. EXAM: CT HEAD WITHOUT CONTRAST TECHNIQUE: Contiguous axial images were obtained from the base of the skull through the vertex without intravenous contrast. COMPARISON:  Prior CT from 06/23/2015. FINDINGS: Brain: Diffuse prominence of the CSF containing spaces compatible with generalized age-related cerebral atrophy. Chronic microvascular ischemic changes present within the periventricular and deep white matter. Confluent hypodensity within the anteromedial right frontal lobe compatible with right ACA territory infarct subacute to chronic in appearance. Additional focus of encephalomalacia within the left occipital lobe compatible with remote left PCA territory infarct. Small  remote left cerebellar infarct noted as well. No other evidence for acute RD vessel territory infarct. No evidence for acute intracranial hemorrhage. No mass lesion, midline shift, or mass effect. Ventricular prominence related to global parenchymal volume loss without hydrocephalus. No extra-axial fluid collection. Vascular: Vascular calcifications present within the carotid siphons and distal vertebral arteries. No hyperdense vessel. Skull: Scalp soft tissues within normal limits.  Calvarium intact. Sinuses/Orbits: Globes and orbital soft tissues within normal limits. Visualized paranasal sinuses and mastoids are clear. ASPECTS (Alberta Stroke Program Early CT Score) - Ganglionic level infarction (caudate, lentiform nuclei, internal capsule, insula, M1-M3 cortex): 7 - Supraganglionic infarction (M4-M6 cortex): 3 Total score (0-10 with 10 being normal): 10 IMPRESSION: 1. Age-indeterminate right ACA territory infarct, favored to largely be subacute to chronic in appearance, although an acute component not excluded. This could be further assessed with dedicated MRI as clinically desired. 2. ASPECTS is 10 3. Additional remote left PCA and left cerebellar infarcts. 4. Moderate atrophy with chronic microvascular ischemic disease. Critical Value/emergent results were called by telephone at the  time of interpretation on 09/29/2016 at 10:31 pm to Dr. Roseanne Reno, who verbally acknowledged these results. Electronically Signed   By: Rise Mu M.D.   On: 09/24/2016 22:35     STUDIES:  CT head 01/22 > age indeterminate right ACA infarct favored to be subacute to chronic, remote left PCA and left cerebellar infarcts.  CULTURES: None.  ANTIBIOTICS: None.  SIGNIFICANT EVENTS: 01/23 > admit.  LINES/TUBES: ETT 01/23 >  DISCUSSION: 83 y.o. female admitted 01/23 with AMS, possible acute CVA (initial head CT neg).  Required intubation in ED for airway protection.  ASSESSMENT / PLAN:  NEUROLOGIC A:    Acute encephalopathy - possible acute infarct but initial head CT negative. P:   Sedation:  Propofol gtt / Fentanyl PRN. RASS goal: 0 to -1. Daily WUA. Neurology following. Assess MRI, EEG, UDS.  PULMONARY A: VDRF - due to inability to protect the airway in the setting of AMS. P:   Full vent support. Wean as able. VAP prevention measures. SBT in AM if neuro status improves. CXR in AM.  CARDIOVASCULAR A:  Hx HTN, Aortic stenosis. P:  Monitor hemodynamics. Trend troponin, lactate. Hold preadmission atenolol.  RENAL A:   AoCKD. P:   NS @ 75. BMP in AM.  GASTROINTESTINAL A:   GI prophylaxis. Nutrition. P:   SUP: Pantoprazole. NPO. SLP eval once extubated.  HEMATOLOGIC A:   VTE Prophylaxis. P:  SCD's / heparin. CBC in AM.  INFECTIOUS A:   No indication of infection. P:   Monitor clinically.  ENDOCRINE A:   No acute issues. P:   No interventions required.   Family updated: Daughter updated at bedside.  Interdisciplinary Family Meeting v Palliative Care Meeting:  Due by: 10/18/16.  CC time: 35 minutes.   Rutherford Guys, [place] [facility] Pager: [phone]  or [phone] 10/11/2016, 12:56 AM

## 2016-10-11 NOTE — Sedation Documentation (Signed)
Vital signs stable. 

## 2016-10-11 NOTE — Progress Notes (Signed)
EEG Completed; Results Pending  

## 2016-10-11 NOTE — Progress Notes (Signed)
CRITICAL VALUE ALERT  Critical value received:  Troponin 43.17 and Lactic Acid 4.1   Date of notification:  10/11/16  Time of notification:  1304  Critical value read back:Yes.    Nurse who received alert:  Francoise SchaumannSarah Hetvi Shawhan  MD notified (1st page):  Dr. Jamison NeighborNestor   Time of first page:  1305  MD notified (2nd page):  Time of second page:  Responding MD:  Dr. Jamison NeighborNestor  Time MD responded:  [phone]

## 2016-10-11 NOTE — Procedures (Signed)
ELECTROENCEPHALOGRAM REPORT  Date of Study: 10/11/2016  Patient's Name: Jasmine Fernandez MRN: [id number] Date of Birth: 05-23-1929  Referring Provider: Rutherford Guysahul Desai, PA-C  Clinical History: This is an 83 year old woman found unresponsive.  Medications: aspirin tablet 325 mg fentaNYL heparin injection  insulin aspart   mupirocin cream  pantoprazole sodium   Technical Summary: A multichannel digital EEG recording measured by the international 10-20 system with electrodes applied with paste and impedances below 5000 ohms performed as portable with EKG monitoring in an intubated and unresponsive patient.  Hyperventilation and photic stimulation were not performed.  The digital EEG was referentially recorded, reformatted, and digitally filtered in a variety of bipolar and referential montages for optimal display.   Description: The patient is intubated and unresponsive during the recording. No sedating medications listed. There is no clear posterior dominant rhythm. The background consists of a mixture of beta activity with diffuse theta and delta slowing. There are frequent broad sharp waves seen over the left hemisphere, maximal over the left frontocentral region, occurring in a pseudo-periodic pattern every 3 to 10 seconds without evolution in frequency or amplitude. Normal sleep architecture is not seen. No reactivity to noxious stimulation. Hyperventilation and photic stimulation were not performed. There were no electrographic seizures seen.    EKG lead showed sinus tachycardia.  Impression: This EEG is abnormal due to the presence of: 1. Moderate diffuse slowing of the background 2. Epileptiform discharges over the left hemisphere, maximal over the left frontocentral region, occurring in a pseudo-periodic pattern  Clinical Correlation of the above findings indicates diffuse cerebral dysfunction that is non-specific in etiology and can be seen with hypoxic/ischemic injury,  toxic/metabolic encephalopathies, post-ictal state, or medication effect. Although lateralized periodic discharges do not represent ongoing seizure activity, they are commonly associated with an acute brain lesion and clinical focal seizures, or post-ictal after focal status epilepticus. There were no electrographic seizures in this study.   Patrcia DollyKaren Phylisha Dix, M.D.

## 2016-10-11 NOTE — Sedation Documentation (Signed)
ED Provider at bedside. 

## 2016-10-12 ENCOUNTER — Inpatient Hospital Stay (HOSPITAL_COMMUNITY): Payer: Medicare Other

## 2016-10-12 ENCOUNTER — Encounter (HOSPITAL_COMMUNITY): Payer: Self-pay | Admitting: *Deleted

## 2016-10-12 DIAGNOSIS — I214 Non-ST elevation (NSTEMI) myocardial infarction: Secondary | ICD-10-CM

## 2016-10-12 DIAGNOSIS — I469 Cardiac arrest, cause unspecified: Secondary | ICD-10-CM

## 2016-10-12 DIAGNOSIS — R4020 Unspecified coma: Secondary | ICD-10-CM

## 2016-10-12 DIAGNOSIS — J9601 Acute respiratory failure with hypoxia: Secondary | ICD-10-CM

## 2016-10-12 DIAGNOSIS — I638 Other cerebral infarction: Secondary | ICD-10-CM

## 2016-10-12 DIAGNOSIS — J9690 Respiratory failure, unspecified, unspecified whether with hypoxia or hypercapnia: Secondary | ICD-10-CM

## 2016-10-12 LAB — CBC
HCT: 44.7 % (ref 36.0–46.0)
HEMOGLOBIN: 14.5 g/dL (ref 12.0–15.0)
MCH: 27.6 pg (ref 26.0–34.0)
MCHC: 32.4 g/dL (ref 30.0–36.0)
MCV: 85 fL (ref 78.0–100.0)
Platelets: 101 10*3/uL — ABNORMAL LOW (ref 150–400)
RBC: 5.26 MIL/uL — ABNORMAL HIGH (ref 3.87–5.11)
RDW: 13.8 % (ref 11.5–15.5)
WBC: 19 10*3/uL — ABNORMAL HIGH (ref 4.0–10.5)

## 2016-10-12 LAB — GLUCOSE, CAPILLARY
GLUCOSE-CAPILLARY: 66 mg/dL (ref 65–99)
GLUCOSE-CAPILLARY: 60 mg/dL — AB (ref 65–99)
GLUCOSE-CAPILLARY: 69 mg/dL (ref 65–99)
Glucose-Capillary: 95 mg/dL (ref 65–99)
Glucose-Capillary: 103 mg/dL — ABNORMAL HIGH (ref 65–99)
Glucose-Capillary: 92 mg/dL (ref 65–99)

## 2016-10-12 LAB — BASIC METABOLIC PANEL
ANION GAP: 12 (ref 5–15)
BUN: 28 mg/dL — AB (ref 6–20)
CALCIUM: 8.7 mg/dL — AB (ref 8.9–10.3)
CO2: 16 mmol/L — ABNORMAL LOW (ref 22–32)
CREATININE: 1.96 mg/dL — AB (ref 0.44–1.00)
Chloride: 113 mmol/L — ABNORMAL HIGH (ref 101–111)
GFR calc Af Amer: 26 mL/min — ABNORMAL LOW (ref >60)
GFR, EST NON AFRICAN AMERICAN: 22 mL/min — AB (ref >60)
GLUCOSE: 86 mg/dL (ref 65–99)
Potassium: 5.2 mmol/L — ABNORMAL HIGH (ref 3.5–5.1)
Sodium: 141 mmol/L (ref 135–145)

## 2016-10-12 LAB — LACTIC ACID, PLASMA: Lactic Acid, Venous: 4 mmol/L (ref 0.5–1.9)

## 2016-10-12 LAB — PHOSPHORUS: PHOSPHORUS: 4.3 mg/dL (ref 2.5–4.6)

## 2016-10-12 LAB — HEMOGLOBIN A1C
HEMOGLOBIN A1C: 6.5 % — AB (ref 4.8–5.6)
Hgb A1c MFr Bld: 6.5 % — ABNORMAL HIGH (ref 4.8–5.6)
MEAN PLASMA GLUCOSE: 140 mg/dL
Mean Plasma Glucose: 140 mg/dL

## 2016-10-12 LAB — MAGNESIUM: MAGNESIUM: 1.6 mg/dL — AB (ref 1.7–2.4)

## 2016-10-12 LAB — LEGIONELLA PNEUMOPHILA SEROGP 1 UR AG: L. PNEUMOPHILA SEROGP 1 UR AG: NEGATIVE

## 2016-10-12 LAB — PROCALCITONIN: Procalcitonin: 39.66 ng/mL

## 2016-10-12 MED ORDER — DEXTROSE 50 % IV SOLN
INTRAVENOUS | Status: AC
  Administered 2016-10-12: 25 mL
  Filled 2016-10-12: qty 50

## 2016-10-12 MED ORDER — DEXTROSE 50 % IV SOLN
INTRAVENOUS | Status: AC
  Administered 2016-10-12: 50 mL
  Filled 2016-10-12: qty 50

## 2016-10-12 MED ORDER — ALBUTEROL SULFATE (2.5 MG/3ML) 0.083% IN NEBU: 2.5000 mg | INHALATION_SOLUTION | Freq: Four times a day (QID) | RESPIRATORY_TRACT | Status: DC | PRN

## 2016-10-12 NOTE — Progress Notes (Signed)
Neurology Progress Note  Subjective: No significant overnight events. There has been no change in her neurologic status. She remains unresponsive off sedation. She is unable to participate with ROS as she is comatose.   Current Meds:   Current Facility-Administered Medications:  .  0.9 %  sodium chloride infusion, , Intravenous, Continuous, William S Minor, NP, Last Rate: 100 mL/hr at 10/12/16 0700 .  aspirin tablet 325 mg, 325 mg, Per Tube, Daily, Chesley Mires, MD, 325 mg at 10/12/16 0900 .  ceFEPIme (MAXIPIME) 500 mg in dextrose 5 % 50 mL IVPB, 500 mg, Intravenous, Q24H, Javier Glazier, MD, 500 mg at 10/11/16 1228 .  chlorhexidine gluconate (MEDLINE KIT) (PERIDEX) 0.12 % solution 15 mL, 15 mL, Mouth Rinse, BID, Javier Glazier, MD, 15 mL at 10/12/16 0854 .  Chlorhexidine Gluconate Cloth 2 % PADS 6 each, 6 each, Topical, Q0600, Javier Glazier, MD, 6 each at 10/12/16 [phone] .  fentaNYL (SUBLIMAZE) injection 50 mcg, 50 mcg, Intravenous, Q2H PRN, Rahul P Desai, PA-C .  heparin injection 5,000 Units, 5,000 Units, Subcutaneous, Q8H, Rahul P Desai, PA-C, 5,000 Units at 10/11/16 2235 .  insulin aspart (novoLOG) injection 0-9 Units, 0-9 Units, Subcutaneous, Q4H, William S Minor, NP, 1 Units at 10/11/16 1518 .  levETIRAcetam (KEPPRA) 250 mg in sodium chloride 0.9 % 100 mL IVPB, 250 mg, Intravenous, Q12H, Darrel Reach, MD, 250 mg at 10/12/16 0448 .  MEDLINE mouth rinse, 15 mL, Mouth Rinse, 10 times per day, Javier Glazier, MD, 15 mL at 10/12/16 0901 .  mupirocin cream (BACTROBAN) 2 %, , Topical, Daily, Chesley Mires, MD .  mupirocin ointment (BACTROBAN) 2 % 1 application, 1 application, Nasal, BID, Javier Glazier, MD, 1 application at 23/76/28 0901 .  pantoprazole sodium (PROTONIX) 40 mg/20 mL oral suspension 40 mg, 40 mg, Per Tube, Q24H, Chesley Mires, MD, 40 mg at 10/12/16 0901 .  propofol (DIPRIVAN) 1000 MG/100ML infusion, 0-50 mcg/kg/min, Intravenous, Continuous, Rahul P Desai, PA-C,  Last Rate: 3.6 mL/hr at 10/12/16 0953, 10 mcg/kg/min at 10/12/16 0953 .  vancomycin (VANCOCIN) 500 mg in sodium chloride 0.9 % 100 mL IVPB, 500 mg, Intravenous, Q24H, Chesley Mires, MD  Objective:  Temp:  [98.3 F (36.8 C)-98.8 F (37.1 C)] 98.8 F (37.1 C) (01/24 0800) Pulse Rate:  [76-99] 99 (01/24 1000) Resp:  [23-34] 28 (01/24 1000) BP: (106-155)/(57-103) 133/68 (01/24 1000) SpO2:  [87 %-100 %] 99 % (01/24 1000) FiO2 (%):  [40 %] 40 % (01/24 0856) Weight:  [57.3 kg (126 lb 5.2 oz)] 57.3 kg (126 lb 5.2 oz) (01/24 0455)  General: WDWN elderly African-American woman lying in ICU bed. She is intubated on low-dose propofol (10 mcg/kg/min). She is unresponsive to verbal, tactile, and noxious stimulation. There is no eye opening, either spontaneously or with stimulation. She does not follow any commands.  HEENT: Neck is supple without lymphadenopathy. ETT in place. Sclerae are anicteric. There is no conjunctival injection.  CV: Regular, 2/6 systolic murmur. Carotid pulses are 2+ and symmetric with no bruits. Distal pulses 2+ and symmetric.  Lungs: CTAB on ventilator. . Neuro: MS: As noted above.  CN: Pupils are equal and reactive from 2-->1 mm bilaterally. She does not blink to visual threat. Eyes are conjugate. No forced deviation and no nystagmus. Corneals are present, stronger on the R than the L. Oculocephalics are sluggish. She demonstrated no response to supraorbital pressure on either side. Her face appears grossly symmetric but is partly obscured by tubes and tape. The  remainder of her cranial nerves cannot be accurately assessed as she is unable to participate with the examination.  Motor: Normal bulk. Tone is reduced throughout. No spontaneous movements are observed. Sensation: She has no response to nailbed pressure in any of her extremities. There is no response to central pain.  DTRs: 2+, brisker on the left than the right. Toes are mute bilaterally. No pathological reflexes.   Coordination and gait: These cannot be assessed as the patient is unable to participate with the examination.  Labs: Lab Results  Component Value Date   WBC 19.0 (H) 10/12/2016   HGB 14.5 10/12/2016   HCT 44.7 10/12/2016   PLT 101 (L) 10/12/2016   GLUCOSE 86 10/12/2016   TRIG 158 (H) 10/11/2016   ALT 14 09/25/2016   AST 26 10/19/2016   NA 141 10/12/2016   K 5.2 (H) 10/12/2016   CL 113 (H) 10/12/2016   CREATININE 1.96 (H) 10/12/2016   BUN 28 (H) 10/12/2016   CO2 16 (L) 10/12/2016   TSH 1.414 10/11/2016   INR 1.19 10/18/2016   HGBA1C 6.5 (H) 10/11/2016   CBC Latest Ref Rng & Units 10/12/2016 10/16/2016 10/15/2016  WBC 4.0 - 10.5 K/uL 19.0(H) - 12.5(H)  Hemoglobin 12.0 - 15.0 g/dL 14.5 16.7(H) 15.5(H)  Hematocrit 36.0 - 46.0 % 44.7 49.0(H) 47.9(H)  Platelets 150 - 400 K/uL 101(L) - 125(L)    Lab Results  Component Value Date   HGBA1C 6.5 (H) 10/11/2016   Lab Results  Component Value Date   ALT 14 09/29/2016   AST 26 10/03/2016   ALKPHOS 69 10/12/2016   BILITOT 1.3 (H) 09/24/2016   TSH 1.414 Free T4 1.51 Vitamin B12 1135 NH3 39  Radiology:  I have personally and independently reviewed the MRI brain without contrast from 10/11/16. This shows extensive multifocal scattered areas of restricted diffusion throughout the brain, the largest of which involves the R temporoparietoocciptal region with additional areas involving the L perisyvlian region, the L parieto-occipital region, both frontal lobes, both basal ganglia, and both cerebellar hemispheres. Most of these involve the cortex, some are associated with mild petechial hemorrhage. This pattern is consistent with acute stroke from a proximal embolic source. There is focal encephalomalacia in the R frontal lobe and L occipital lobe, c/w remote infarcts. There is moderate chronic small vessel ischemic disease.   Other diagnostic studies:  EEG shows moderate diffuse slowing with left hemispheric epileptiform discharges over  the left frontocentral region that occur in a pseudo-periodic pattern. No seizures.   TTE 10/11/16: Mild LVH with EF 20-25%. Diffuse hypokinesis with akinesis of the inferolateral myocardium. Grade 2 diastolic dysfunction. Severe AV stenosis. Severely calcified mitral valve annulus with mild MR. Severe dilation of the L atrium and mild dilation of the R atrium. Severe RV dysfunction.   A/P:   1. Acute Ischemic This is an acute stroke involving the multiple different vascular territories in both anterior and posterior circulations bilaterally. It is most likely cardioembolic in etiology. TTE supports this with severe AS, severe mitral annular calcification, severely reduced EF of 20-25%, and hypokinesis/akinesis. Known risk factors for cerebrovascular disease in this patient include the above structural cardiac problems, age, prior stroke, DM, HTN.  Hgb a1c 6.5. Vascular imaging likely to be of little utility in this case given apparent cardioembolic etiology of stroke and given that she is not a candidate for any sort of intervention so will defer for now. Continue antiplatelet therapy with aspirin for secondary stroke prevention. Ensure adequate glucose control.  Avoid fever and hyperglycemia as these can extend the infarct. Avoid hypotonic IVF to minimize exacerbation of post-stroke edema. Initiate rehab services. DVT prophylaxis as needed.   2. Coma: MRI showed extensive bihemispheric embolic stroke with prominent cortical invovlement. This could result in acute encephalopathy can coma, particularly given reduced cerebral reserve to start with. Seizure now seems much less likely. There is no evident metabolic explanation for her presentation. She has nothing to suggest CNS infection. The PLEDs noted on her EEG are consistent with acute stroke. Because these are associated with increased risk of seizure, I will continue Keppra. Continue supportive care, optimize metabolic status and limiting sedation as much  as tolerate.   Neurologic prognosis for meaningful neurologic recovery is quite guarded. This is influenced by her poor cardiac health at baseline. I would consider palliative care consultation to assist family with goals of care if not already done.   No family was present at the time of my visit.   The stroke team will assume care of the patient starting October 23, 2016.   This patient is critically ill and at significant risk of neurological worsening, death and care requires constant monitoring of vital signs, hemodynamics,respiratory and cardiac monitoring, neurological assessment, discussion with family, other specialists and medical decision making of high complexity. A total of 30 minutes of critical care time was spent on this case.   Melba Coon, MD [facility]

## 2016-10-12 NOTE — Progress Notes (Signed)
Pt CBG 69. Dextrose given. Dr. Jamison NeighborNestor notified in person. Monitoring closely. Diarra Ceja, [place] ScrapeSarah E, RN

## 2016-10-12 NOTE — Code Documentation (Signed)
[facility] responded to a Code Blue for [name]. ACLS was already underway at our arrival and critical care via tele was leading the code. No further assistance was required.  Jasmine [name] [name], MD [facility] - PGY1 Pager [phone]

## 2016-10-12 NOTE — Progress Notes (Signed)
Overnight patient had low output totaling only 210 mL (17.5 mL/hr) and bleeding from around foley area (could be vaginal). eLink MD made aware and will monitor. Also called critical value of Lactic Acid 4.0, which is trending down. Will continue to monitor patient.

## 2016-10-12 NOTE — Progress Notes (Signed)
PULMONARY / CRITICAL CARE MEDICINE   Name: Jasmine Fernandez MRN: [id number] DOB: 12-01-1928    ADMISSION DATE:  09/28/2016 CONSULTATION DATE:  10/11/16  REFERRING MD:  Mesner  CHIEF COMPLAINT:  AMS  HISTORY OF PRESENT ILLNESS:  Pt is encephelopathic; therefore, this HPI is obtained from chart review. Jasmine Fernandez is a 83 y.o. female with PMH as outlined below. [facility]/22 as code stroke.  She was apparently at her home with daughter when she slumped over while sitting in chair and became unresponsive.  Daughter called EMS and upon their arrival, pt remained in this state.  In ED, she required intubation for airway protection.  She had CT of the head which did not show any acute infarct but demonstrate an age indeterminate right ACA infarct favored to be subacute to chronic.  There were also remote left PCA and left cerebellar infarcts.   SUBJECTIVE:  On vent, unresponsive. Off sedation  VITAL SIGNS: BP 122/68   Pulse 95   Temp 98.7 F (37.1 C) (Axillary)   Resp (!) 25   Ht 5\' 2"  (1.575 m)   Wt 126 lb 5.2 oz (57.3 kg)   SpO2 100%   BMI 23.10 kg/m   HEMODYNAMICS:    VENTILATOR SETTINGS: Vent Mode: PRVC FiO2 (%):  [40 %] 40 % Set Rate:  [18 bmp] 18 bmp Vt Set:  [400 mL] 400 mL PEEP:  [5 cmH20] 5 cmH20 Plateau Pressure:  [18 cmH20-22 cmH20] 19 cmH20  INTAKE / OUTPUT: I/O last 3 completed shifts: In: 5240.5 [I.V.:2975.5; Other:60; IV Piggyback:2205] Out: 410 [Urine:260; Emesis/NG output:150]   PHYSICAL EXAMINATION: General: Elderly female, in NAD. Neur: Off sedation, non-responsive. HEENT: [place]/AT. PERRL, sclerae anicteric. Arcus senilis. Cardiovascular: RRR, 2/6 SEM. Lungs: Respirations even and unlabored. Decreased bs bases W/R/R. Abdomen: BS x 4, soft, NT/ND.  Musculoskeletal: No gross deformities, no edema.  Skin: Intact, warm, no rashes. RT foot dressing dry and intact  LABS:  BMET  Recent Labs Lab 10/18/2016 2150 09/23/2016 2156  10/11/16 0023 10/12/16 0413  NA 139 141 140 141  K 4.8 4.5 4.6 5.2*  CL 106 107 111 113*  CO2 18*  --  15* 16*  BUN 19 23* 20 28*  CREATININE 1.37* 1.20* 1.46* 1.96*  GLUCOSE 171* 175* 189* 86    Electrolytes  Recent Labs Lab 10/17/2016 0018 09/21/2016 2150 10/11/16 0023 10/12/16 0413  CALCIUM  --  10.1 9.6 8.7*  MG 2.0  --   --  1.6*  PHOS 3.1  --   --  4.3    CBC  Recent Labs Lab 10/05/2016 2150 10/17/2016 2156 10/12/16 0413  WBC 12.5*  --  19.0*  HGB 15.5* 16.7* 14.5  HCT 47.9* 49.0* 44.7  PLT 125*  --  101*    Coag's  Recent Labs Lab 10/08/2016 2150  APTT 27  INR 1.19    Sepsis Markers  Recent Labs Lab 10/11/16 0843 10/11/16 1205 10/12/16 0413  LATICACIDVEN 4.8* 4.1* 4.0*  PROCALCITON  --  18.59 39.66    ABG  Recent Labs Lab 10/03/2016 2311  PHART 7.215*  PCO2ART 39.0  PO2ART 429.0*    Liver Enzymes  Recent Labs Lab 10/08/2016 2150  AST 26  ALT 14  ALKPHOS 69  BILITOT 1.3*  ALBUMIN 3.6    Cardiac Enzymes  Recent Labs Lab 10/11/16 0023 10/11/16 1205 10/11/16 2052  TROPONINI 3.57* 43.17* 45.06*    Glucose  Recent Labs Lab 10/11/16 0858 10/11/16 1229  10/11/16 1515 10/11/16 2009 10/11/16 2342 10/12/16 0356  GLUCAP 128* 187* 143* 92 91 95    Imaging Mr Brain Wo Contrast  Result Date: 10/12/2016 CLINICAL DATA:  Initial evaluation for acute coma. EXAM: MRI HEAD WITHOUT CONTRAST TECHNIQUE: Multiplanar, multiecho pulse sequences of the brain and surrounding structures were obtained without intravenous contrast. COMPARISON:  Prior CT from 10-23-16. FINDINGS: Brain: Diffuse prominence of the CSF containing spaces is compatible with generalized age-related cerebral atrophy. Patchy and confluent T2/FLAIR hyperintensity within the periventricular and deep white matter most compatible with chronic microvascular ischemic disease, moderate nature. Encephalomalacia with gliosis within the anterior right frontal lobe compatible with  remote right ACA territory infarct. Additional remote left PCA infarct present within the left occipital pole. Few scattered remote lacunar infarcts noted within the basal ganglia. Additional few remote cerebellar infarcts noted. There are extensive multifocal patchy areas of restricted diffusion involving both cerebral hemispheres, compatible with multifocal acute ischemic infarcts. The largest confluent area of infarction is present within the right temporal occipital region (series 5, image 24). Preponderance of the infarcts are cortical in nature. There are patchy infarcts involving the bilateral basal ganglia as well, most notable at the level of the right internal capsule (series 5, image 27). Additional small bilateral cerebellar infarcts noted as well (series 5, image 12). Relative sparing of the brainstem. There are a few scattered patchy areas of associated petechial hemorrhage involving a few of these infarcts, most notable on the right at the level of the right frontal lobe (series 9, image 18). Most notable on the left at the level of the operculum (series 9, image 14). Associated gyral swelling and edema without significant mass effect. Overall, these infarcts appear to be relatively similar and age. Central thromboembolic etiology favored given the multiple vascular distributions involved. No mass lesion, midline shift, or mass effect. Ventricular prominence related to global parenchymal volume loss without hydrocephalus. No extra-axial fluid collection. Major dural sinuses are grossly patent. Pituitary gland and suprasellar region within normal limits. Vascular: Abnormal signal intensity within the right V4 segment, like related atheromatous disease and/ or slow flow or occlusion. Major intracranial vascular flow voids otherwise maintained. Skull and upper cervical spine: Craniocervical junction within normal limits. Degenerative spondylolysis noted within the visualized upper cervical spine without  significant stenosis. Bone marrow signal intensity within normal limits. No scalp soft tissue abnormality. Sinuses/Orbits: Globes and orbital soft tissues within normal limits. Mild mucosal thickening within the ethmoidal air cells. Paranasal sinuses are otherwise clear. No mastoid effusion. Inner ear structures within normal limits. IMPRESSION: 1. Extensive multifocal acute ischemic infarcts involving the bilateral cerebral and cerebellar hemispheres as above. Scattered areas of associated petechial hemorrhage without frank hemorrhagic transformation. No significant mass effect. Central thromboembolic source is favored given the multiple vascular distributions involved. 2. Superimposed remote right ACA and left PCA territory infarcts, with additional remote basal ganglia and cerebellar infarcts. 3. Abnormal flow void within the right V4 segment, which may be related to atheromatous plaque, slow flow and/or occlusion. 4. Generalized cerebral atrophy with moderate chronic microvascular ischemic disease. Electronically Signed   By: Rise Mu M.D.   On: 10/12/2016 02:13   Dg Abd Portable 1v  Result Date: 10/11/2016 CLINICAL DATA:  83 year old female with gastric tube placement. Initial encounter. EXAM: PORTABLE ABDOMEN - 1 VIEW COMPARISON:  02/08/2008. FINDINGS: Gastric tube tip gastric fundus level. Nonspecific bowel gas pattern without plain film evidence of bowel obstruction. Cardiomegaly. Scoliosis and degenerative changes lumbar spine. Coarse calcification pelvic inlet may  represent calcified fibroid. Vascular calcifications. IMPRESSION: Gastric tube tip gastric fundus level. Electronically Signed   By: Lacy Duverney M.D.   On: 10/11/2016 10:35     STUDIES:  CT head 01/22 > age indeterminate right ACA infarct favored to be subacute to chronic, remote left PCA and left cerebellar infarcts. MRI 1/23  with multiple infarcts  CULTURES: 1/23 bc>> 1/23 Sputum>>  ANTIBIOTICS: 1/23 vanc>> 1/23  cefepime>>  SIGNIFICANT EVENTS: 01/23 > admit.  LINES/TUBES: ETT 01/23 >  DISCUSSION: 83 y.o. female admitted 01/23 with AMS, possible acute CVA (initial head CT neg).  Required intubation in ED for airway protection. MRI cw poor prognosis  ASSESSMENT / PLAN:  NEUROLOGIC A:   Acute encephalopathy - possible acute infarct but initial head CT negative. P:   Sedation:  Propofol gtt off / Fentanyl PRN. RASS goal: 0 to -1. Daily WUA. Neurology following. MRI with multiple infarcts  PULMONARY A: VDRF - due to inability to protect the airway in the setting of AMS. P:   Full vent support. Wean as able. Not extubatable VAP prevention measures. SBT in AM if neuro status improves.and acidosis improves CXR in AM.  CARDIOVASCULAR A:  Hx HTN, Aortic stenosis. + trop EF 20% P:  Monitor hemodynamics. Trend troponin, lactate. Hold preadmission atenolol.  RENAL Lab Results  Component Value Date   CREATININE 1.96 (H) 10/12/2016   CREATININE 1.46 (H) 10/11/2016   CREATININE 1.20 (H) Oct 28, 2016   Lactic acid 5.4 ->4.0 A:   AoCKD. Lactic acidosis P:   NS @ 75. Fluid bolus May need bicarb drip if lactic acid does not resolve BMP   GASTROINTESTINAL A:   GI prophylaxis. Nutrition. P:   SUP: Pantoprazole. NPO. SLP eval if extubated.  HEMATOLOGIC  Recent Labs  10/28/16 2156 10/12/16 0413  HGB 16.7* 14.5     polycythemia  A:   VTE Prophylaxis. P:  SCD's / heparin. CBC   INFECTIOUS A:   ? PNA Procal 39.66 Neg RVP +MRSA PCR but been + for 9 months  P:   1/23 Vanc / cefepime>>   ENDOCRINE CBG (last 3)   A:   Hyperglycemia  P:   SSI   Family updated: Daughter(POA) was informed of poor prognosis, MRI results, 2D echo results. Extensive discussion concerning limited code status. Daughter wants full code as her mother told her that's what the mother wants.  Interdisciplinary Family Meeting v Palliative Care Meeting:  Due by: 10/18/16.  CC  time: 35 minutes.   Brett Canales Minor ACNP [facility] Pager [phone] till 3 pm If no answer page [phone] 10/12/2016, 8:18 AM

## 2016-10-12 NOTE — Progress Notes (Signed)
PCCM Interval Progress Note  Called to bedside for code blue.  VT initial rhythm > shocked x 2 without conversion.  Given epi x 3, Mg, CaCl, amio in addition to roughly 14 minutes CPR before ROSC.  Spoke with daughter Jasmine Fernandez at bedside regarding current circumstances and extremely poor prognosis.  She requested to continue with full code given some tension with family dynamics.  She called several of her siblings and 2 siblings (brother Jasmine Fernandez and sister Jasmine Fernandez) wanted to not put Mrs. Lupinski through additional suffering; however, the other daughter and Jasmine Fernandez wanted to continue with full efforts.  I asked to speak to siblings and spoke with Jasmine Fernandez and [place].  They both understood the severity of the situation and stated that they did not want to put their mother through additional suffering.  I recommended that we make her DNR in the event that she were to arrest again and they both agreed to this if our medical team felt that this was in their mothers best interest.  Jasmine Fernandez then called 3rd sibling (unsure of name) who I also spoke with and she stated "I can not make that decision to stop trying, I just can't".  I informed her that her 2 siblings Jasmine Fernandez and Jasmine Fernandez understood the situation and were in agreement if we felt that it was best to list pt as DNR. She then said "well if you feel that is best for her, that is fine, but I can not make that decision".  Jasmine Fernandez updated on this and she voiced understanding but wished siblings would say otherwise.  [facility] to [facility].  Code status changed to DNR given that this is in pt's best interest.  Jasmine FusiPaula then left the unit and called main unit phone twice asking that she be called first if there are any updates since "I'm the one that's been here with my mom and the one she would want called".  I informed her that staff would update her with any changes, as well as siblings who will arrive at bedside momentarily.  Additional CC time: 40  minutes.   Rutherford Guysahul Desai, [place] - [facility] Pager: [phone]  or [phone] 10/12/2016, 11:57 PM

## 2016-10-12 NOTE — Progress Notes (Signed)
eLink Physician-Brief Progress Note Patient Name: Jasmine Fernandez DOB: 1929/01/26 MRN: [id number]   Date of Service  10/12/2016  HPI/Events of Note  At shift change, pt noted to be in cardiac arrest.  On rhthym check, pulse VT noted on 2 occasions, shocked times 2 without conversion. Pt given several doses of epi, 2g Mg; 1g CaCl, 300 mg Amio. D/w NP, family still wants pt full code.    eICU Interventions  [facility] interventions plus CPR x about 12 min.  Recommend DNR status but family still wants full code.  --If arrests again would limit interventions given likely futility.      Intervention Category Major Interventions: Arrhythmia - evaluation and management  Shane Crutchradeep Sephora Boyar 10/12/2016, 11:37 PM

## 2016-10-13 ENCOUNTER — Inpatient Hospital Stay (HOSPITAL_COMMUNITY): Payer: Medicare Other

## 2016-10-13 LAB — CULTURE, RESPIRATORY

## 2016-10-13 LAB — CULTURE, RESPIRATORY W GRAM STAIN: Special Requests: NORMAL

## 2016-10-13 MED FILL — Medication: Qty: 1 | Status: AC

## 2016-10-14 ENCOUNTER — Telehealth: Payer: Self-pay

## 2016-10-14 LAB — VITAMIN B1: Vitamin B1 (Thiamine): 168 nmol/L (ref 66.5–200.0)

## 2016-10-14 NOTE — Telephone Encounter (Signed)
On 10/14/16 I received a death certificate from [facility] ([facility] The death certificate is for burial. The patient is a patient of [facility]. The death certificate will be taken to Pulmonary Unit @ [facility] this pm for signature.  On 10/17/16 I received the death certificate back from [facility]. I got the death certificate ready and called the funeral home to let them know the death certificate is ready for pickup.

## 2016-10-16 LAB — CULTURE, BLOOD (ROUTINE X 2)
CULTURE: NO GROWTH
Culture: NO GROWTH

## 2016-10-20 NOTE — Progress Notes (Signed)
Patient experienced abnormal heart rhythm of ventricular tachycardia at 2312. Patient full code, CPR initiated. Code blue called, see code sheet for information. Daughter at bedside, other family members called and notified.

## 2016-10-20 NOTE — Discharge Summary (Signed)
DEATH NOTE: For complete accounting of patient's history and physical exam on presentation please refer to the H&P dictated on 10/11/16. In brief patient was an 83 year old female with a past medical history of aortic stenosis, diabetes & essential hypertension. Patient presented to Hospital on 1/22 as a code stroke. Apparently patient was at home when she "slumped over" while sitting in a chair and became unresponsive. Patient required endotracheal intubation for altered mental status and inability to protect airway. An initial CT scan showed age-indeterminate infarcts and the patient was admitted to the ICU for further treatment and investigation workup. At presentation she was with acute on chronic renal failure. Patient's daughter was updated by the admitting service. She was ultimately found to have a metabolic acidosis with a lactic acidosis as well. Further workup including MRI of the brain showed extensive multiple acute infarcts involving bilateral cerebral and cerebellar hemispheres with remote right ACA & left PCA territory infarcts as well as remote basal ganglia and cerebellar infarcts. The patient had generalized cerebral atrophy with moderate chronic microvascular ischemic disease. Neurology was consulted on admission and ultimately performed an EEG showing moderate diffuse background slowing and upon clinical correlation findings consistent with diffuse cerebral dysfunction. There was no electrographic seizures in the study. Chest x-ray imaging showed a left lower lobe opacification and infectious workup yielded not only a positive urine streptococcal pneumoniae antigen but also a sputum culture positive for pseudomonas aeruginosa. Patient was initially started on broad-spectrum antibiotics with vancomycin and cefepime. Blood work also showed elevated troponin I consistent with a non-ST elevation MI. However, given the petechial hemorrhage noted on her brain MRI systemic anticoagulation was not  pursued given the potential for hemorrhagic transformation. Patient was treated with aspirin 325 mg daily. On 10/12/16 at approximately 11:30 PM the patient suffered a cardiac arrest with ventricular tachycardia as the initial rhythm which underwent cardioversion 2 without conversion. Ultimately return of spontaneous circulation was obtained after approximately 14 minutes of CPR. Family discussions were held at bedside as well as via phone with the patient's multiple children and upon review of the electronic medical record all were in agreement with a DO NOT RESUSCITATE status given the severity of her illness. Further review of the electronic medical record shows a documented time of death at 1:12 AM on 11-18-16 with absence of heart or breath sounds on auscultation. Family was at bedside and notified of time of death.  DIAGNOSES AT DEATH: 1. Non-ST elevation myocardial infarction 2. Acute encephalopathy 3. Bilateral cerebral and cerebellar infarcts 4. Acute hypoxic respiratory failure 5. Metabolic & lactic acidosis 6. Acute on chronic renal failure undetermined stage 7. Pseudomonas aeruginosa left lower lobe pneumonia 8. History of aortic stenosis 9. History of diabetes mellitus 10. History of essential hypertension

## 2016-10-20 NOTE — Progress Notes (Addendum)
Time of death confirmed at 0112. No audible heart sounds or breath sounds ausculted at 0110 by Antonieta IbaSavannah Jenkins, RN or at [phone] by Bartholomew CrewsAmanda Kimiye Strathman, RN. Family at bedside and notified of time of death. Emotional support provided to family.   Noticed rings on patients right hand. Renea EePaula Ousley, daughter, was called and confirmed that her mom would want rings left on finger. Patient sent to morgue with rings.

## 2016-10-20 DEATH — deceased

## 2017-03-01 IMAGING — MR MR HEAD W/O CM
8 of 10 series · 36 of 48 positions shown · non-contrast
Comparison: Prior CT from 10/10/2016.

CLINICAL DATA: Initial evaluation for acute coma.

EXAM:
MRI HEAD WITHOUT CONTRAST
TECHNIQUE: Multiplanar, multiecho pulse sequences of the brain and surrounding
structures were obtained without intravenous contrast.

[Series 3: FLAIR · sagittal · 5.0mm · 0.47mm/px · 2 of 23 slices shown (1 of 2)]
[im 1/23]
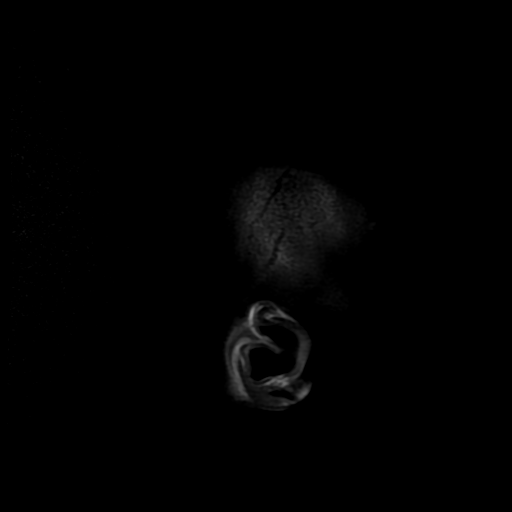
[im 23/23]
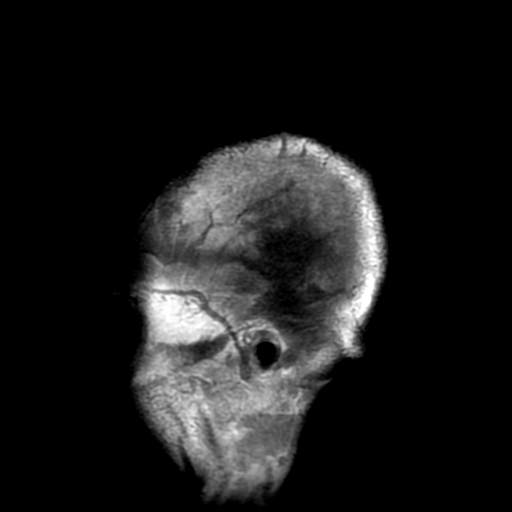

[Series 5: DWI · axial · 3.0mm · 0.94mm/px · z∈[-88,+57]mm · 9 of 100 slices shown (1 of 2)]
[im 1/100]
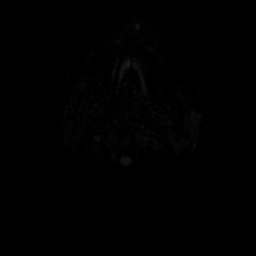
[im 19/100]
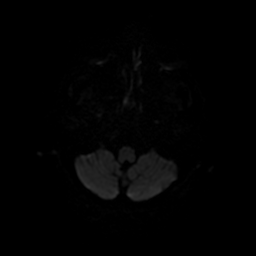
[im 28/100]
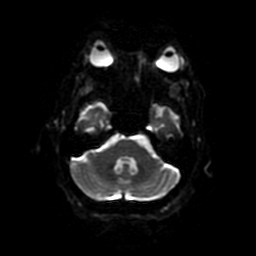
[im 46/100]
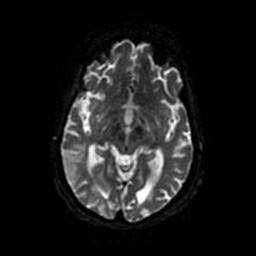
[im 55/100]
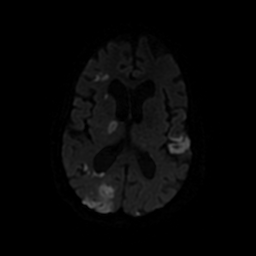
[im 73/100]
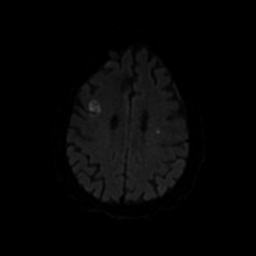
[im 82/100]
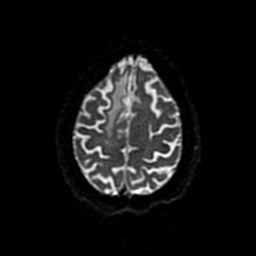
[im 91/100]
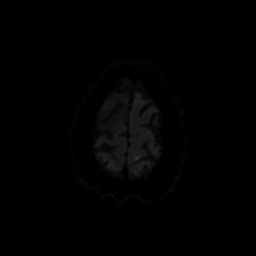
[im 100/100]
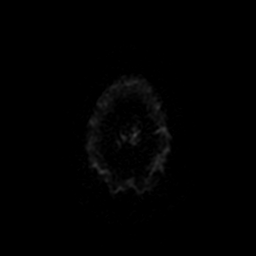

[Series 6: T2 · axial · 5.0mm · 0.43mm/px · z∈[-83,+59]mm · 3 of 25 slices shown (1 of 2)]
[im 1/25]
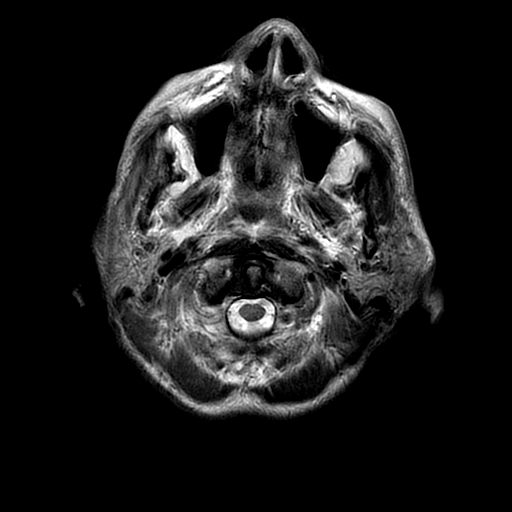
[im 13/25]
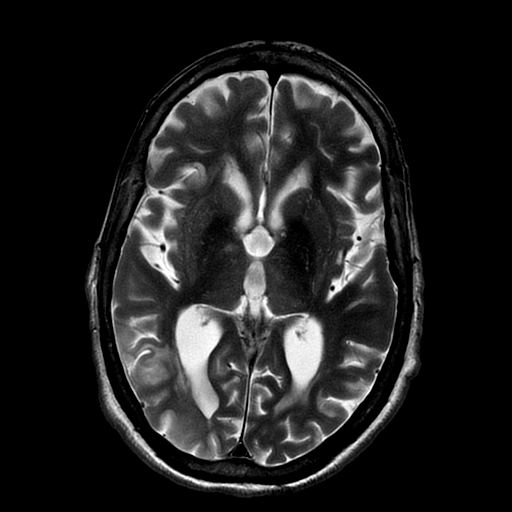
[im 25/25]
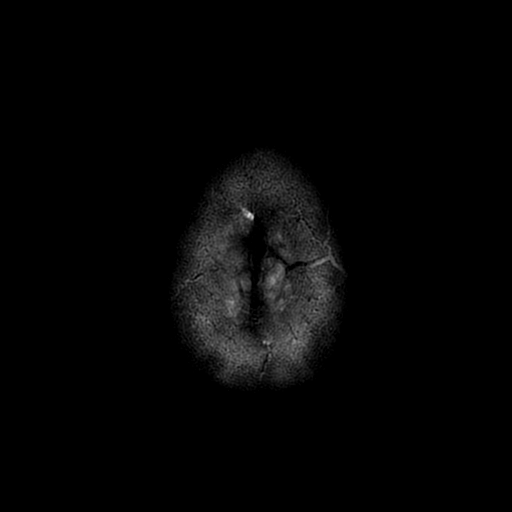

[Series 7: FLAIR · axial · 5.0mm · 0.43mm/px · z∈[-83,+59]mm · 3 of 25 slices shown (2 of 2)]
[im 1/25]
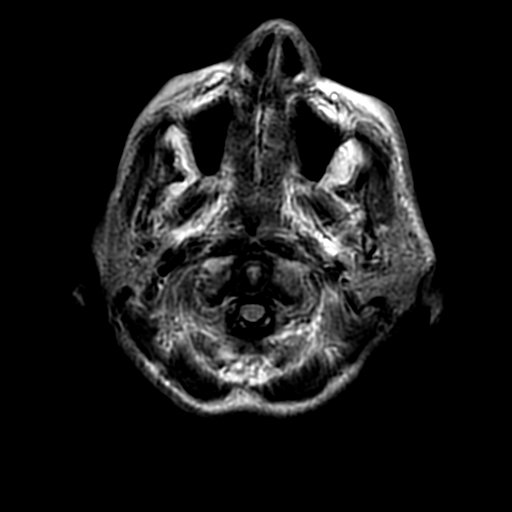
[im 13/25]
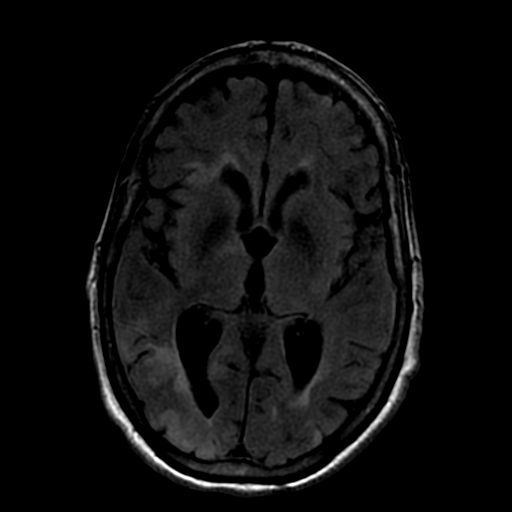
[im 25/25]
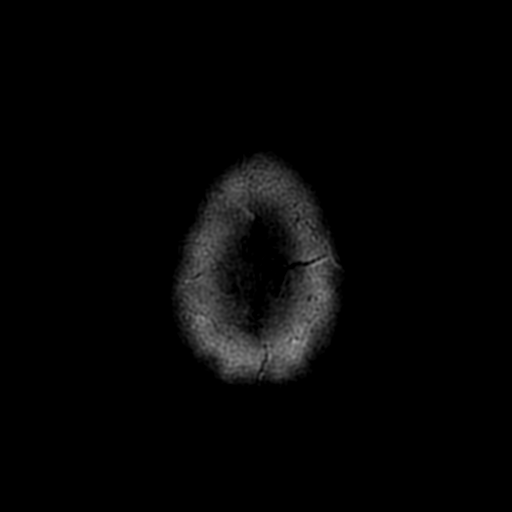

[Series 8: DWI · coronal · 4.0mm · 0.94mm/px · 8 of 72 slices shown (2 of 2)]
[im 1/72]
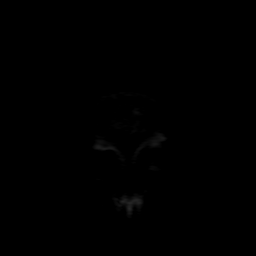
[im 11/72]
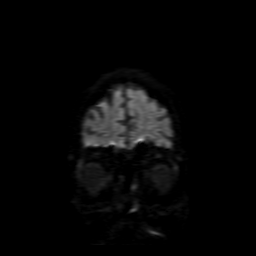
[im 21/72]
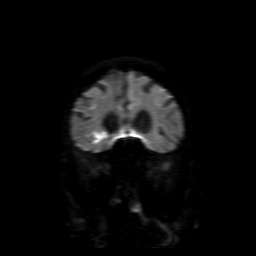
[im 31/72]
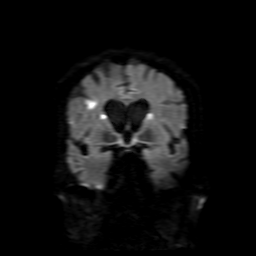
[im 41/72]
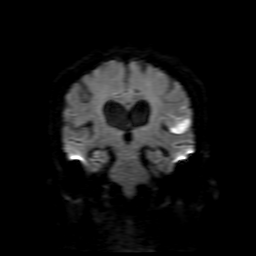
[im 51/72]
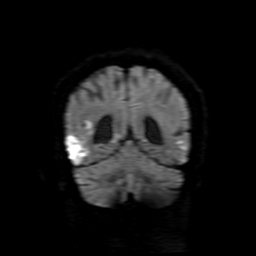
[im 61/72]
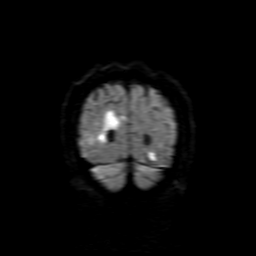
[im 72/72]
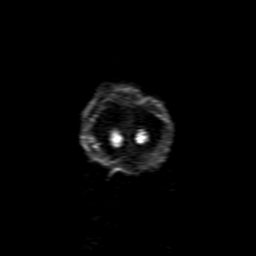

[Series 11: T2 · coronal · 5.0mm · 0.47mm/px · 1 of 30 slices shown (2 of 2)]
[im 1/30]
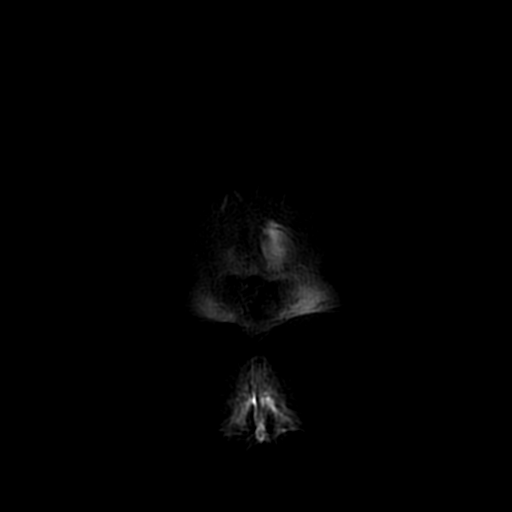

[Series 550: ADC · axial · 3.0mm · 0.94mm/px · z∈[-88,+57]mm · 6 of 50 slices shown (1 of 2)]
[im 1/50]
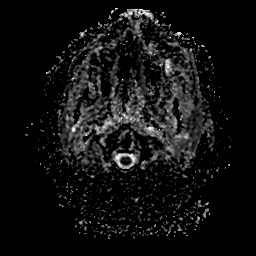
[im 10/50]
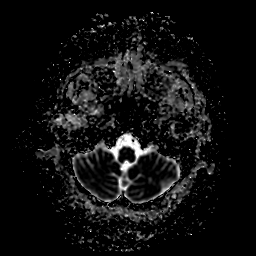
[im 20/50]
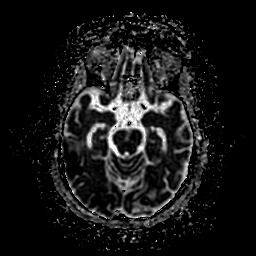
[im 30/50]
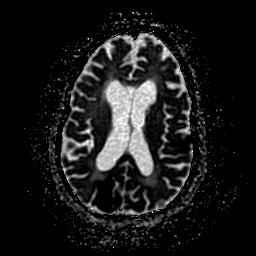
[im 40/50]
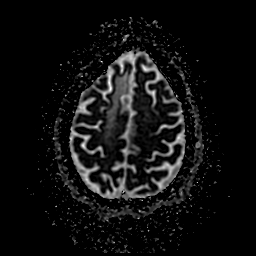
[im 50/50]
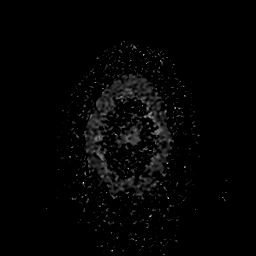

[Series 850: ADC · coronal · 4.0mm · 0.94mm/px · 4 of 36 slices shown (2 of 2)]
[im 1/36]
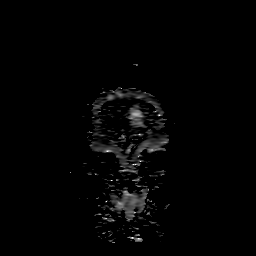
[im 12/36]
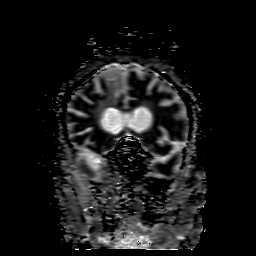
[im 24/36]
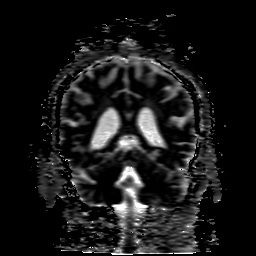
[im 36/36]
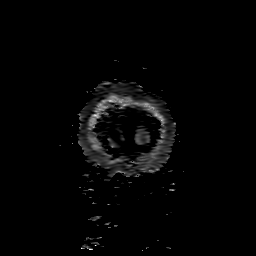

[36 of 48 positions shown; findings below may reference images not displayed]

FINDINGS: Brain: Diffuse prominence of the CSF containing spaces is compatible
with generalized age-related cerebral atrophy. Patchy and confluent
T2/FLAIR hyperintensity within the periventricular and deep white
matter most compatible with chronic microvascular ischemic disease,
moderate nature.

Encephalomalacia with gliosis within the anterior right frontal lobe
compatible with remote right ACA territory infarct. Additional
remote left PCA infarct present within the left occipital pole. Few
scattered remote lacunar infarcts noted within the basal ganglia.
Additional few remote cerebellar infarcts noted.

There are extensive multifocal patchy areas of restricted diffusion
involving both cerebral hemispheres, compatible with multifocal
acute ischemic infarcts. The largest confluent area of infarction is
present within the right temporal occipital region (series 5, image
24). Preponderance of the infarcts are cortical in nature. There are
patchy infarcts involving the bilateral basal ganglia as well, most
notable at the level of the right internal capsule (series 5, image
27). Additional small bilateral cerebellar infarcts noted as well
(series 5, image 12). Relative sparing of the brainstem. There are a
few scattered patchy areas of associated petechial hemorrhage
involving a few of these infarcts, most notable on the right at the
level of the right frontal lobe (series 9, image 18). Most notable
on the left at the level of the operculum (series 9, image 14).
Associated gyral swelling and edema without significant mass effect.
Overall, these infarcts appear to be relatively similar and age.
Central thromboembolic etiology favored given the multiple vascular
distributions involved.

No mass lesion, midline shift, or mass effect. Ventricular
prominence related to global parenchymal volume loss without
hydrocephalus. No extra-axial fluid collection. Major dural sinuses
are grossly patent.

Pituitary gland and suprasellar region within normal limits.

Vascular: Abnormal signal intensity within the right V4 segment,
like related atheromatous disease and/ or slow flow or occlusion.
Major intracranial vascular flow voids otherwise maintained.

Skull and upper cervical spine: Craniocervical junction within
normal limits. Degenerative spondylolysis noted within the
visualized upper cervical spine without significant stenosis. Bone
marrow signal intensity within normal limits. No scalp soft tissue
abnormality.

Sinuses/Orbits: Globes and orbital soft tissues within normal
limits. Mild mucosal thickening within the ethmoidal air cells.
Paranasal sinuses are otherwise clear. No mastoid effusion. Inner
ear structures within normal limits.
IMPRESSION: 1. Extensive multifocal acute ischemic infarcts involving the
bilateral cerebral and cerebellar hemispheres as above. Scattered
areas of associated petechial hemorrhage without frank hemorrhagic
transformation. No significant mass effect. Central thromboembolic
source is favored given the multiple vascular distributions
involved.
2. Superimposed remote right ACA and left PCA territory infarcts,
with additional remote basal ganglia and cerebellar infarcts.
3. Abnormal flow void within the right V4 segment, which may be
related to atheromatous plaque, slow flow and/or occlusion.
4. Generalized cerebral atrophy with moderate chronic microvascular
ischemic disease.

## 2018-03-13 ENCOUNTER — Telehealth: Payer: Self-pay | Admitting: *Deleted
